# Patient Record
Sex: Female | Born: 1945 | Race: Black or African American | Hispanic: No | Marital: Married | Smoking: Never smoker
Health system: Southern US, Community
[De-identification: ages and names within clinical notes are randomized; demographics above are authoritative.]

## PROBLEM LIST (undated history)

## (undated) DIAGNOSIS — R351 Nocturia: Secondary | ICD-10-CM

## (undated) DIAGNOSIS — N3010 Interstitial cystitis (chronic) without hematuria: Secondary | ICD-10-CM

## (undated) DIAGNOSIS — H409 Unspecified glaucoma: Secondary | ICD-10-CM

## (undated) DIAGNOSIS — R300 Dysuria: Secondary | ICD-10-CM

## (undated) DIAGNOSIS — N3289 Other specified disorders of bladder: Secondary | ICD-10-CM

## (undated) DIAGNOSIS — R3915 Urgency of urination: Secondary | ICD-10-CM

## (undated) DIAGNOSIS — D863 Sarcoidosis of skin: Secondary | ICD-10-CM

## (undated) DIAGNOSIS — I509 Heart failure, unspecified: Secondary | ICD-10-CM

## (undated) DIAGNOSIS — I5040 Unspecified combined systolic (congestive) and diastolic (congestive) heart failure: Secondary | ICD-10-CM

## (undated) DIAGNOSIS — R350 Frequency of micturition: Secondary | ICD-10-CM

## (undated) DIAGNOSIS — I471 Supraventricular tachycardia: Secondary | ICD-10-CM

## (undated) DIAGNOSIS — I4719 Other supraventricular tachycardia: Secondary | ICD-10-CM

## (undated) DIAGNOSIS — R3989 Other symptoms and signs involving the genitourinary system: Secondary | ICD-10-CM

## (undated) DIAGNOSIS — H548 Legal blindness, as defined in USA: Secondary | ICD-10-CM

## (undated) HISTORY — DX: Unspecified combined systolic (congestive) and diastolic (congestive) heart failure: I50.40

## (undated) HISTORY — DX: Heart failure, unspecified: I50.9

## (undated) HISTORY — DX: Other supraventricular tachycardia: I47.19

## (undated) HISTORY — DX: Supraventricular tachycardia: I47.1

## (undated) HISTORY — PX: CATARACT EXTRACTION W/ INTRAOCULAR LENS IMPLANT: SHX1309

---

## 2013-04-13 ENCOUNTER — Other Ambulatory Visit: Payer: Self-pay | Admitting: Urology

## 2013-04-14 ENCOUNTER — Encounter (HOSPITAL_BASED_OUTPATIENT_CLINIC_OR_DEPARTMENT_OTHER): Payer: Self-pay | Admitting: *Deleted

## 2013-04-14 MED ORDER — TRIAMCINOLONE ACETONIDE 40 MG/ML IJ SUSP: Freq: Once | INTRAMUSCULAR | Status: DC

## 2013-04-14 MED ORDER — BUPIVACAINE HCL (PF) 0.5 % IJ SOLN: Freq: Once | INTRAMUSCULAR | Status: DC

## 2013-04-18 ENCOUNTER — Encounter (HOSPITAL_BASED_OUTPATIENT_CLINIC_OR_DEPARTMENT_OTHER): Payer: Self-pay | Admitting: *Deleted

## 2013-04-18 NOTE — Progress Notes (Signed)
NPO AFTER MN. ARRIVES AT 0930. NEEDS HG. 

## 2013-04-21 ENCOUNTER — Encounter (HOSPITAL_BASED_OUTPATIENT_CLINIC_OR_DEPARTMENT_OTHER): Payer: Self-pay | Admitting: Anesthesiology

## 2013-04-21 ENCOUNTER — Encounter (HOSPITAL_BASED_OUTPATIENT_CLINIC_OR_DEPARTMENT_OTHER)
Admission: RE | Disposition: A | Discharge status: Home / Self Care | Payer: Self-pay | Source: Ambulatory Visit | Attending: Urology

## 2013-04-21 ENCOUNTER — Ambulatory Visit (HOSPITAL_BASED_OUTPATIENT_CLINIC_OR_DEPARTMENT_OTHER): Payer: PRIVATE HEALTH INSURANCE | Admitting: Anesthesiology

## 2013-04-21 ENCOUNTER — Ambulatory Visit (HOSPITAL_BASED_OUTPATIENT_CLINIC_OR_DEPARTMENT_OTHER)
Admission: RE | Admit: 2013-04-21 | Discharge: 2013-04-21 | Disposition: A | Discharge status: Home / Self Care | Payer: PRIVATE HEALTH INSURANCE | Source: Ambulatory Visit | Attending: Urology | Admitting: Urology

## 2013-04-21 DIAGNOSIS — H409 Unspecified glaucoma: Secondary | ICD-10-CM | POA: Insufficient documentation

## 2013-04-21 DIAGNOSIS — N94819 Vulvodynia, unspecified: Secondary | ICD-10-CM | POA: Insufficient documentation

## 2013-04-21 DIAGNOSIS — F411 Generalized anxiety disorder: Secondary | ICD-10-CM | POA: Insufficient documentation

## 2013-04-21 DIAGNOSIS — N3010 Interstitial cystitis (chronic) without hematuria: Secondary | ICD-10-CM | POA: Insufficient documentation

## 2013-04-21 DIAGNOSIS — N3289 Other specified disorders of bladder: Secondary | ICD-10-CM | POA: Insufficient documentation

## 2013-04-21 DIAGNOSIS — N35919 Unspecified urethral stricture, male, unspecified site: Secondary | ICD-10-CM | POA: Insufficient documentation

## 2013-04-21 DIAGNOSIS — D869 Sarcoidosis, unspecified: Secondary | ICD-10-CM | POA: Insufficient documentation

## 2013-04-21 HISTORY — DX: Interstitial cystitis (chronic) without hematuria: N30.10

## 2013-04-21 HISTORY — DX: Other specified disorders of bladder: N32.89

## 2013-04-21 HISTORY — DX: Unspecified glaucoma: H40.9

## 2013-04-21 HISTORY — DX: Dysuria: R30.0

## 2013-04-21 HISTORY — PX: CYSTOSCOPY WITH HYDRODISTENSION AND BIOPSY: SHX5127

## 2013-04-21 HISTORY — DX: Urgency of urination: R39.15

## 2013-04-21 HISTORY — DX: Other symptoms and signs involving the genitourinary system: R39.89

## 2013-04-21 HISTORY — DX: Nocturia: R35.1

## 2013-04-21 HISTORY — DX: Frequency of micturition: R35.0

## 2013-04-21 HISTORY — DX: Legal blindness, as defined in USA: H54.8

## 2013-04-21 HISTORY — DX: Sarcoidosis of skin: D86.3

## 2013-04-21 SURGERY — CYSTOSCOPY, WITH BLADDER HYDRODISTENSION AND BIOPSY
Anesthesia: General | Site: Bladder | Wound class: Clean Contaminated

## 2013-04-21 MED ORDER — FENTANYL CITRATE 0.05 MG/ML IJ SOLN
INTRAMUSCULAR | Status: DC | PRN
  Administered 2013-04-21 (×6): 50 ug via INTRAVENOUS

## 2013-04-21 MED ORDER — TRIAMCINOLONE ACETONIDE 40 MG/ML IJ SUSP
INTRAMUSCULAR | Status: DC | PRN
  Administered 2013-04-21: 40 mg

## 2013-04-21 MED ORDER — OXYCODONE-ACETAMINOPHEN 5-325 MG PO TABS: 1.0000 | ORAL_TABLET | Freq: Four times a day (QID) | ORAL | Status: DC | PRN

## 2013-04-21 MED ORDER — PENTOSAN POLYSULFATE SODIUM 100 MG PO CAPS
100.0000 mg | ORAL_CAPSULE | Freq: Three times a day (TID) | ORAL | Status: DC
Start: 2013-04-21 — End: 2019-03-18

## 2013-04-21 MED ORDER — KETOROLAC TROMETHAMINE 30 MG/ML IJ SOLN
15.0000 mg | Freq: Once | INTRAMUSCULAR | Status: DC | PRN
  Filled 2013-04-21: qty 1

## 2013-04-21 MED ORDER — LIDOCAINE HCL (CARDIAC) 20 MG/ML IV SOLN
INTRAVENOUS | Status: DC | PRN
  Administered 2013-04-21: 60 mg via INTRAVENOUS

## 2013-04-21 MED ORDER — ACETAMINOPHEN 10 MG/ML IV SOLN
INTRAVENOUS | Status: DC | PRN
  Administered 2013-04-21: 1000 mg via INTRAVENOUS

## 2013-04-21 MED ORDER — CEFAZOLIN SODIUM-DEXTROSE 2-3 GM-% IV SOLR
2.0000 g | INTRAVENOUS | Status: AC
  Administered 2013-04-21: 2 g via INTRAVENOUS
  Filled 2013-04-21: qty 50

## 2013-04-21 MED ORDER — ONDANSETRON HCL 4 MG/2ML IJ SOLN
INTRAMUSCULAR | Status: DC | PRN
  Administered 2013-04-21: 4 mg via INTRAVENOUS

## 2013-04-21 MED ORDER — DEXAMETHASONE SODIUM PHOSPHATE 4 MG/ML IJ SOLN
INTRAMUSCULAR | Status: DC | PRN
  Administered 2013-04-21: 10 mg via INTRAVENOUS

## 2013-04-21 MED ORDER — HYDROXYZINE HCL 10 MG PO TABS: 10.0000 mg | ORAL_TABLET | Freq: Three times a day (TID) | ORAL | Status: DC | PRN

## 2013-04-21 MED ORDER — BELLADONNA ALKALOIDS-OPIUM 16.2-60 MG RE SUPP
RECTAL | Status: DC | PRN
  Administered 2013-04-21: 1 via RECTAL

## 2013-04-21 MED ORDER — BUPIVACAINE HCL 0.5 % IJ SOLN
INTRAMUSCULAR | Status: DC | PRN
  Administered 2013-04-21: 10 mL

## 2013-04-21 MED ORDER — URIBEL 118 MG PO CAPS: 1.0000 | ORAL_CAPSULE | Freq: Two times a day (BID) | ORAL | Status: DC | PRN

## 2013-04-21 MED ORDER — PHENAZOPYRIDINE HCL 200 MG PO TABS
ORAL | Status: DC | PRN
  Administered 2013-04-21: 12:00:00 via INTRAVESICAL

## 2013-04-21 MED ORDER — PROPOFOL 10 MG/ML IV BOLUS
INTRAVENOUS | Status: DC | PRN
  Administered 2013-04-21: 200 mg via INTRAVENOUS

## 2013-04-21 MED ORDER — MIDAZOLAM HCL 5 MG/5ML IJ SOLN
INTRAMUSCULAR | Status: DC | PRN
  Administered 2013-04-21 (×2): 1 mg via INTRAVENOUS

## 2013-04-21 MED ORDER — LACTATED RINGERS IV SOLN
INTRAVENOUS | Status: DC
  Administered 2013-04-21 (×3): via INTRAVENOUS
  Filled 2013-04-21: qty 1000

## 2013-04-21 MED ORDER — PROMETHAZINE HCL 25 MG/ML IJ SOLN
6.2500 mg | INTRAMUSCULAR | Status: DC | PRN
  Filled 2013-04-21: qty 1

## 2013-04-21 MED ORDER — KETOROLAC TROMETHAMINE 30 MG/ML IJ SOLN
INTRAMUSCULAR | Status: DC | PRN
  Administered 2013-04-21: 30 mg via INTRAVENOUS

## 2013-04-21 MED ORDER — CALCIUM GLYCEROPHOSPHATE 340 (65-50) MG (CA-P) PO TABS: 1.0000 | ORAL_TABLET | Freq: Three times a day (TID) | ORAL | Status: DC

## 2013-04-21 MED ORDER — FENTANYL CITRATE 0.05 MG/ML IJ SOLN
25.0000 ug | INTRAMUSCULAR | Status: DC | PRN
  Filled 2013-04-21: qty 1

## 2013-04-21 SURGICAL SUPPLY — 25 items
ADAPTER CATH WHT DISP STRL (CATHETERS) IMPLANT
BAG DRAIN URO-CYSTO SKYTR STRL (DRAIN) ×2 IMPLANT
BOOTIES KNEE HIGH SLOAN (MISCELLANEOUS) IMPLANT
CANISTER SUCT LVC 12 LTR MEDI- (MISCELLANEOUS) ×2 IMPLANT
CATH ROBINSON RED A/P 16FR (CATHETERS) IMPLANT
CLOTH BEACON ORANGE TIMEOUT ST (SAFETY) ×2 IMPLANT
DRAPE CAMERA CLOSED 9X96 (DRAPES) ×2 IMPLANT
ELECT REM PT RETURN 9FT ADLT (ELECTROSURGICAL) ×2
ELECTRODE REM PT RTRN 9FT ADLT (ELECTROSURGICAL) ×1 IMPLANT
GLOVE BIO SURGEON STRL SZ 6.5 (GLOVE) ×2 IMPLANT
GLOVE BIO SURGEON STRL SZ7.5 (GLOVE) ×2 IMPLANT
GLOVE BIOGEL M 8.0 STRL (GLOVE) ×2 IMPLANT
GLOVE ECLIPSE 8.0 STRL XLNG CF (GLOVE) IMPLANT
GLOVE INDICATOR 6.5 STRL GRN (GLOVE) ×2 IMPLANT
GOWN PREVENTION PLUS LG XLONG (DISPOSABLE) ×2 IMPLANT
GOWN STRL REIN XL XLG (GOWN DISPOSABLE) ×2 IMPLANT
NDL SAFETY ECLIPSE 18X1.5 (NEEDLE) ×1 IMPLANT
NEEDLE HYPO 18GX1.5 SHARP (NEEDLE) ×1
NEEDLE HYPO 22GX1.5 SAFETY (NEEDLE) ×2 IMPLANT
NEEDLE SPNL 22GX7 QUINCKE BK (NEEDLE) ×2 IMPLANT
NS IRRIG 500ML POUR BTL (IV SOLUTION) IMPLANT
PACK CYSTOSCOPY (CUSTOM PROCEDURE TRAY) ×2 IMPLANT
SYR 20CC LL (SYRINGE) ×4 IMPLANT
SYR BULB IRRIGATION 50ML (SYRINGE) IMPLANT
WATER STERILE IRR 3000ML UROMA (IV SOLUTION) ×2 IMPLANT

## 2013-04-21 NOTE — Progress Notes (Signed)
Assisted up to br x3 no void either time.  Pt has no urge to void.  Bladder scan shows 132cc'c urine in bladder .  Pt denies discomfort.  + flatus. abd soft nondistended.  Dr. Patsi Sears paged via beeper. Pt had 2.5 L IV and 1 L po

## 2013-04-21 NOTE — Progress Notes (Signed)
Pt  Chose to have foley inserted overnight with removal in am.  Pt and family expressed concern over pt getting home and still being unable to void.  Pt and family instructed in foley care and removal.  They are to call the Dr. If she is unable to void after cath removal.  Pt and family verbalized their understanding.

## 2013-04-21 NOTE — H&P (Signed)
Active Problems Problems  1. Abdominal Pain 789.00 2. Chronic Cystitis 595.2 3. Chronic Interstitial Cystitis 595.1 4. Nocturia 788.43 5. Urinary Frequency 788.41 6. 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 Sherron Monday for continued bladder aned vulvar pain.  She was seen by Dr. Sherron Monday on 03/22/13 as a [place] referred by Elvia Collum, PA regarding [name]'s suprapubic pain and frequency.  She was previously seen in 2009 and had failed Vesicare and Reunion and had urodynamics. She had pelvic pain and frequency.  It was thought that she had IC at that time. She never had cysto, HOD,however.  She currently c/o suprapubic pressure. She  voids q 10-30 minutes, because of pressure and pain,  and it is worse when she holds er urine and is relieved temporarily when she voids. Her flow is poor. Sometimes she sprays. She does not feel empty. She can double void a small amount. Nocturia 2-3 times an hour at night.   She denies a history of kidney stones, previous GU surgery, and urinary tract infections.  She has no neurologic risk factors or symptoms. She is prone to constipation. She was recently treated as a urinary tract infection.   There is no other modifying factors or associated signs or symptoms. There is no other aggravating or relieving factors.   Past Medical History Problems  1. History of  Glaucoma 365.9 2. History of  Sarcoidosis 135  Surgical History Problems  1. History of  No Surgical Problems  Current Meds 1. Aleve TABS; Therapy: (Recorded:23Jul2014) to 2. Calcium TABS; Therapy: (Recorded:13Apr2009) to 3. Centrum Silver TABS; Therapy: (Recorded:23Jul2014) to 4. Fish Oil CAPS; Therapy: (Recorded:13Apr2009) to 5. Hydrocortisone OINT; Therapy: (Recorded:23Jul2014) to 6. Meloxicam 15 MG Oral Tablet; Take 1 tablet daily; Therapy: 23Jul2014 to (Evaluate:20Nov2014)   Requested for:  23Jul2014; Last Rx:23Jul2014 7. Multi-Vitamin TABS; Therapy: (Recorded:13Apr2009) to  Allergies Medication  1. Cinnamon TABS  Family History Problems  1. Paternal history of  Acute Myocardial Infarction V17.3 2. Son's history of  Crohn's Disease 3. Family history of  Family Health Status Number Of Children 1 son and 1 daughter 4. Family history of  Family Health Status Of Father - Deceased 38yrs, MI 5. Family history of  Family Health Status Of Mother - Deceased 28yrs, complications of child birth 63. Paternal history of  Heart Disease V17.49 7. Son's history of  Sarcoidosis  Social History Problems  1. Alcohol Use wine rarely every other month 2. Caffeine Use 1-2 qd 3. Marital History - Divorced V61.03 4. Never A Smoker 5. Occupation: environmental services  Review of Systems Genitourinary, constitutional, skin, eye, otolaryngeal, hematologic/lymphatic, cardiovascular, pulmonary, endocrine, musculoskeletal, gastrointestinal, neurological and psychiatric system(s) were reviewed and pertinent findings if present are noted.  Genitourinary: urinary frequency, urinary urgency, nocturia, urinary hesitancy, urinary stream starts and stops, pelvic pain, suprapubic pain and initiating urination requires straining.  Gastrointestinal: constipation.  Constitutional: feeling tired (fatigue).  Integumentary: skin rash/lesion.  ENT: sore throat and sinus problems.  Respiratory: cough.  Musculoskeletal: joint pain.    Vitals Vital Signs [Data Includes: Last 1 Day]  08Aug2014 09:12AM  Blood Pressure: 163 / 77 Temperature: 98.2 F Heart Rate: 94  Physical Exam Constitutional: Well nourished and well developed . No acute distress.  Neck: The appearance of the neck is normal.  Pulmonary: No respiratory distress.  Abdomen: The abdomen is soft and nontender. No masses are palpated. No CVA tenderness.  No hernias are palpable. No hepatosplenomegaly noted. Genitourinary:. Levator spasm and  pain.  Chaperone Present: laura.  Examination of the external genitalia shows no vulvar atrophy. The urethra is normal in appearance, not tender and no urethral caruncle. There is no urethral mass. Urethral hypermobility is not present. There is no urethral discharge. No periurethral cyst is identified. There is no urethral prolapse. Vaginal exam demonstrates tenderness and the vaginal epithelium to be poorly estrogenized, but no abnormalities, no discharge and no uterine prolapse. No cystocele is identified. No enterocele is identified. No rectocele is identified. There is no evidence of a vesicovaginal fistula. The cervix is is without abnormalities and without masses. There is no cervical discharge. There is no cervical motion tenderness The uterus is without abnormalities. The adnexa are palpably normal. The bladder is normal on palpation. The anus is normal on inspection. The perineum is normal on inspection.  Lymphatics: The femoral and inguinal nodes are not enlarged or tender.  Skin: Normal skin turgor, no visible rash and no visible skin lesions.  Neuro/Psych:. Mood and affect are appropriate.    Results/Data Urine [Data Includes: Last 1 Day]   08Aug2014  COLOR STRAW   APPEARANCE CLEAR   SPECIFIC GRAVITY 1.010   pH 7.0   GLUCOSE NEG mg/dL  BILIRUBIN NEG   KETONE NEG mg/dL  BLOOD NEG   PROTEIN NEG mg/dL  UROBILINOGEN 0.2 mg/dL  NITRITE NEG   LEUKOCYTE ESTERASE SMALL   SQUAMOUS EPITHELIAL/HPF MODERATE   WBC 7-10 WBC/hpf  RBC 0-2 RBC/hpf  BACTERIA NONE SEEN   CRYSTALS NONE SEEN   CASTS NONE SEEN    Assessment Assessed  1. 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 Abdominal Pain 789.00         Vulvadynea and levator spasm. She has IC and has not responded to medications in the past. She has not had cysto, HOD, and I have disucssed this with he and her daughter ( 45 min). She has great anxiety, and we have disucssed alternatives for her. I have discussed  levator spasm, and my plan for valium suppositories ( vaginal), and will proceed with cysto, HOD.   Plan Health Maintenance (V70.0)  1. UA With REFLEX  Done: 08Aug2014 09:05AM   Valium vaginal suppositories.  Cysto, HOD.   Signatures Electronically signed by : Jethro Bolus, M.D.; Apr 07 2013  4:39PM

## 2013-04-21 NOTE — Transfer of Care (Signed)
Immediate Anesthesia Transfer of Care Note  Patient: Robin Estes  Procedure(s) Performed: Procedure(s) (LRB): CYSTOSCOPY/BLADDER BIOPSY/HYDRODISTENSION (N/A)  Patient Location: PACU  Anesthesia Type: General  Level of Consciousness: awake, alert  and oriented  Airway & Oxygen Therapy: Patient Spontanous Breathing and Patient connected to face mask oxygen  Post-op Assessment: Report given to PACU RN and Post -op Vital signs reviewed and stable  Post vital signs: Reviewed and stable  Complications: No apparent anesthesia complications

## 2013-04-21 NOTE — Anesthesia Postprocedure Evaluation (Signed)
  Anesthesia Post-op Note  Patient: Robin Estes  Procedure(s) Performed: Procedure(s) (LRB): CYSTOSCOPY/BLADDER BIOPSY/HYDRODISTENSION (N/A)  Patient Location: PACU  Anesthesia Type: General  Level of Consciousness: awake and alert   Airway and Oxygen Therapy: Patient Spontanous Breathing  Post-op Pain: mild  Post-op Assessment: Post-op Vital signs reviewed, Patient's Cardiovascular Status Stable, Respiratory Function Stable, Patent Airway and No signs of Nausea or vomiting  Last Vitals:  Filed Vitals:   04/21/13 1205  BP: 120/68  Pulse: 75  Temp: 36.1 C  Resp: 19    Post-op Vital Signs: stable   Complications: No apparent anesthesia complications

## 2013-04-21 NOTE — Interval H&P Note (Signed)
History and Physical Interval Note:  04/21/2013 10:25 AM  Robin Estes  has presented today for surgery, with the diagnosis of Chronic Interstitial Cystitis  The various methods of treatment have been discussed with the patient and family. After consideration of risks, benefits and other options for treatment, the patient has consented to  Procedure(s) with comments: CYSTOSCOPY/BLADDER BIOPSY/HYDRODISTENSION (N/A) - BLADDER BIOPSY   as a surgical intervention .  The patient's history has been reviewed, patient examined, no change in status, stable for surgery.  I have reviewed the patient's chart and labs.  Questions were answered to the patient's satisfaction.     Jethro Bolus I

## 2013-04-21 NOTE — Anesthesia Preprocedure Evaluation (Addendum)
Anesthesia Evaluation  Patient identified by MRN, date of birth, ID band Patient awake    Reviewed: Allergy & Precautions, H&P , NPO status , Patient's Chart, lab work & pertinent test results  Airway Mallampati: II TM Distance: >3 FB Neck ROM: Full    Dental no notable dental hx.    Pulmonary neg pulmonary ROS,  breath sounds clear to auscultation  Pulmonary exam normal       Cardiovascular negative cardio ROS  Rhythm:Regular Rate:Normal     Neuro/Psych Legally blind in right eye, as defined in [place]   SECONDARY TO GLAUCOMA  negative neurological ROS  negative psych ROS   GI/Hepatic negative GI ROS, Neg liver ROS,   Endo/Other  negative endocrine ROS  Renal/GU negative Renal ROS  negative genitourinary   Musculoskeletal negative musculoskeletal ROS (+)   Abdominal   Peds negative pediatric ROS (+)  Hematology negative hematology ROS (+)   Anesthesia Other Findings   Reproductive/Obstetrics negative OB ROS                           Anesthesia Physical Anesthesia Plan  ASA: I  Anesthesia Plan: General   Post-op Pain Management:    Induction: Intravenous  Airway Management Planned: LMA  Additional Equipment:   Intra-op Plan:   Post-operative Plan:   Informed Consent: I have reviewed the patients History and Physical, chart, labs and discussed the procedure including the risks, benefits and alternatives for the proposed anesthesia with the patient or authorized representative who has indicated his/her understanding and acceptance.   Dental advisory given  Plan Discussed with: CRNA and Surgeon  Anesthesia Plan Comments:         Anesthesia Quick Evaluation

## 2013-04-21 NOTE — Anesthesia Procedure Notes (Signed)
Procedure Name: LMA Insertion Date/Time: 04/21/2013 11:20 AM Performed by: Norva Pavlov Pre-anesthesia Checklist: Patient identified, Emergency Drugs available, Suction available and Patient being monitored Patient Re-evaluated:Patient Re-evaluated prior to inductionOxygen Delivery Method: Circle System Utilized Preoxygenation: Pre-oxygenation with 100% oxygen Intubation Type: IV induction Ventilation: Mask ventilation without difficulty LMA: LMA inserted LMA Size: 4.0 Number of attempts: 1 Airway Equipment and Method: bite block Placement Confirmation: positive ETCO2 Tube secured with: Tape Dental Injury: Teeth and Oropharynx as per pre-operative assessment

## 2013-04-21 NOTE — Progress Notes (Signed)
Dr. Patsi Sears informed that pt is unable to void.  Denies pain / discomfort.  . Had 1 liter po and 2.5 liters IV including OR.  Orders received to wait 30 min to 1 hr .  If pt doesn't void.  I&O cath and send pt home w instructions to call for problems or go to the nearest ER . Pt tolerated procedure well. Leg bag applied after 175cc's urine collected.

## 2013-04-21 NOTE — Op Note (Signed)
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  Surgeon:  Kathie Rhodes. Patsi Sears, MD  First assistant:  None  Anesthesia:  General LMA  Preparation:  After appropriate preanesthesia, the patient was brought the operative room, placed on the operating table in dorsal supine position where general LMA anesthesia was introduced. She was then replaced in the dorsal lithotomy position with pubis was prepped with Betadine solution draped in usual fashion. History was reviewed.  Review history:   Problems  1. Abdominal Pain 789.00  2. Chronic Cystitis 595.2  3. 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 Nocturia 788.43  5. Urinary Frequency 788.41  6. 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 Sherron Monday for continued bladder aned vulvar pain. She was seen by Dr. Sherron Monday on 03/22/13 as a [place] referred by Elvia Collum, PA regarding Ms. Damewood's suprapubic pain and frequency.  She was previously seen in 2009 and had failed Vesicare and Reunion and had urodynamics. She had pelvic pain and frequency. It was thought that she had IC at that time. She never had cysto, HOD,however.  She currently c/o suprapubic pressure. She voids q 10-30 minutes, because of pressure and pain, and it is worse when she holds er urine and is relieved temporarily when she voids. Her flow is poor. Sometimes she sprays. She does not feel empty. She can double void a small amount. Nocturia 2-3 times an hour at night.  She denies a history of kidney stones, previous GU surgery, and urinary  tract infections.  She has no neurologic risk factors or symptoms. She is prone to constipation. She was recently treated as a urinary tract infection.  There is no other modifying factors or associated signs or symptoms. There is no other aggravating or relieving factors.  Past Medical History  Problems  1. History of Glaucoma 365.9  2. History of Sarcoidosis 135    Statement of  Likelihood of Success: Excellent. TIME-OUT observed.:  Procedure:  The urethra appeared to be quite stenotic, was dilated sequentially to a size 24 [place] with the female sounds. The 22 French cystoscope was placed in the bladder, and the bladder base appeared normal, with clear reflux from both cortices. There was no evidence of bladder stone, tumor, or diverticular formation. The bladder held only 300 cc, and repeat cystoscopy showed marked relations, and easy friable bleeding. Cold cup bladder biopsies were accomplished and the sites were cauterized. The bladder was not able to be hydrodistended, with attempts at hydrodistention yielding only 300 cc bladder.  Following this, Pyridium Marcaine mixture was placed in the bladder. The patient then underwent injection of Marcaine Kenalog solution in the bladder base, and in the spastic levator muscles. She received a B. and O. suppository, IV Tylenol, IV Toradol, as well as IV antibiotic. She was awakened, and taken to recovery room in good condition.

## 2013-04-24 ENCOUNTER — Encounter (HOSPITAL_BASED_OUTPATIENT_CLINIC_OR_DEPARTMENT_OTHER): Payer: Self-pay | Admitting: Urology

## 2013-04-24 LAB — POCT HEMOGLOBIN-HEMACUE: Hemoglobin: 12.1 g/dL (ref 12.0–15.0)

## 2019-03-15 ENCOUNTER — Encounter (HOSPITAL_BASED_OUTPATIENT_CLINIC_OR_DEPARTMENT_OTHER): Payer: Self-pay | Admitting: *Deleted

## 2019-03-15 ENCOUNTER — Emergency Department (HOSPITAL_BASED_OUTPATIENT_CLINIC_OR_DEPARTMENT_OTHER): Payer: Medicare Other

## 2019-03-15 ENCOUNTER — Inpatient Hospital Stay (HOSPITAL_BASED_OUTPATIENT_CLINIC_OR_DEPARTMENT_OTHER)
Admission: EM | Admit: 2019-03-15 | Discharge: 2019-03-18 | DRG: 292 | Disposition: A | Discharge status: Home / Self Care | Payer: Medicare Other | Attending: Internal Medicine | Admitting: Internal Medicine

## 2019-03-15 ENCOUNTER — Other Ambulatory Visit: Payer: Self-pay

## 2019-03-15 DIAGNOSIS — I248 Other forms of acute ischemic heart disease: Secondary | ICD-10-CM | POA: Diagnosis present

## 2019-03-15 DIAGNOSIS — E669 Obesity, unspecified: Secondary | ICD-10-CM | POA: Diagnosis not present

## 2019-03-15 DIAGNOSIS — I110 Hypertensive heart disease with heart failure: Principal | ICD-10-CM | POA: Diagnosis present

## 2019-03-15 DIAGNOSIS — I5041 Acute combined systolic (congestive) and diastolic (congestive) heart failure: Secondary | ICD-10-CM | POA: Diagnosis present

## 2019-03-15 DIAGNOSIS — D8689 Sarcoidosis of other sites: Secondary | ICD-10-CM | POA: Diagnosis not present

## 2019-03-15 DIAGNOSIS — R778 Other specified abnormalities of plasma proteins: Secondary | ICD-10-CM

## 2019-03-15 DIAGNOSIS — Z1159 Encounter for screening for other viral diseases: Secondary | ICD-10-CM | POA: Diagnosis not present

## 2019-03-15 DIAGNOSIS — R739 Hyperglycemia, unspecified: Secondary | ICD-10-CM | POA: Diagnosis not present

## 2019-03-15 DIAGNOSIS — Z79899 Other long term (current) drug therapy: Secondary | ICD-10-CM | POA: Diagnosis not present

## 2019-03-15 DIAGNOSIS — E785 Hyperlipidemia, unspecified: Secondary | ICD-10-CM | POA: Diagnosis present

## 2019-03-15 DIAGNOSIS — R9431 Abnormal electrocardiogram [ECG] [EKG]: Secondary | ICD-10-CM | POA: Diagnosis present

## 2019-03-15 DIAGNOSIS — I340 Nonrheumatic mitral (valve) insufficiency: Secondary | ICD-10-CM | POA: Diagnosis not present

## 2019-03-15 DIAGNOSIS — Z6832 Body mass index (BMI) 32.0-32.9, adult: Secondary | ICD-10-CM

## 2019-03-15 DIAGNOSIS — T501X5A Adverse effect of loop [high-ceiling] diuretics, initial encounter: Secondary | ICD-10-CM | POA: Diagnosis not present

## 2019-03-15 DIAGNOSIS — D869 Sarcoidosis, unspecified: Secondary | ICD-10-CM | POA: Diagnosis present

## 2019-03-15 DIAGNOSIS — I5040 Unspecified combined systolic (congestive) and diastolic (congestive) heart failure: Secondary | ICD-10-CM

## 2019-03-15 DIAGNOSIS — R590 Localized enlarged lymph nodes: Secondary | ICD-10-CM

## 2019-03-15 DIAGNOSIS — Z8249 Family history of ischemic heart disease and other diseases of the circulatory system: Secondary | ICD-10-CM | POA: Diagnosis not present

## 2019-03-15 DIAGNOSIS — D649 Anemia, unspecified: Secondary | ICD-10-CM | POA: Diagnosis not present

## 2019-03-15 DIAGNOSIS — H548 Legal blindness, as defined in USA: Secondary | ICD-10-CM | POA: Diagnosis not present

## 2019-03-15 DIAGNOSIS — M8580 Other specified disorders of bone density and structure, unspecified site: Secondary | ICD-10-CM | POA: Diagnosis present

## 2019-03-15 DIAGNOSIS — I517 Cardiomegaly: Secondary | ICD-10-CM | POA: Diagnosis present

## 2019-03-15 DIAGNOSIS — N3010 Interstitial cystitis (chronic) without hematuria: Secondary | ICD-10-CM | POA: Diagnosis not present

## 2019-03-15 DIAGNOSIS — I471 Supraventricular tachycardia: Secondary | ICD-10-CM | POA: Diagnosis present

## 2019-03-15 DIAGNOSIS — R000 Tachycardia, unspecified: Secondary | ICD-10-CM | POA: Diagnosis present

## 2019-03-15 DIAGNOSIS — J90 Pleural effusion, not elsewhere classified: Secondary | ICD-10-CM

## 2019-03-15 DIAGNOSIS — I4891 Unspecified atrial fibrillation: Secondary | ICD-10-CM | POA: Diagnosis present

## 2019-03-15 DIAGNOSIS — I4719 Other supraventricular tachycardia: Secondary | ICD-10-CM

## 2019-03-15 DIAGNOSIS — R0609 Other forms of dyspnea: Secondary | ICD-10-CM

## 2019-03-15 DIAGNOSIS — I5020 Unspecified systolic (congestive) heart failure: Secondary | ICD-10-CM

## 2019-03-15 DIAGNOSIS — R0602 Shortness of breath: Secondary | ICD-10-CM | POA: Diagnosis present

## 2019-03-15 DIAGNOSIS — R7989 Other specified abnormal findings of blood chemistry: Secondary | ICD-10-CM | POA: Diagnosis present

## 2019-03-15 HISTORY — DX: Abnormal electrocardiogram (ECG) (EKG): R94.31

## 2019-03-15 HISTORY — DX: Cardiomegaly: I51.7

## 2019-03-15 HISTORY — DX: Pleural effusion, not elsewhere classified: J90

## 2019-03-15 HISTORY — DX: Sarcoidosis, unspecified: D86.9

## 2019-03-15 HISTORY — DX: Other specified abnormalities of plasma proteins: R77.8

## 2019-03-15 HISTORY — DX: Localized enlarged lymph nodes: R59.0

## 2019-03-15 LAB — CBC WITH DIFFERENTIAL/PLATELET
Abs Immature Granulocytes: 0.02 10*3/uL (ref 0.00–0.07)
Basophils Absolute: 0 10*3/uL (ref 0.0–0.1)
Basophils Relative: 0 %
Eosinophils Absolute: 0.1 10*3/uL (ref 0.0–0.5)
Eosinophils Relative: 1 %
HCT: 37.2 % (ref 36.0–46.0)
Hemoglobin: 11.3 g/dL — ABNORMAL LOW (ref 12.0–15.0)
Immature Granulocytes: 0 %
Lymphocytes Relative: 9 %
Lymphs Abs: 0.4 10*3/uL — ABNORMAL LOW (ref 0.7–4.0)
MCH: 28.2 pg (ref 26.0–34.0)
MCHC: 30.4 g/dL (ref 30.0–36.0)
MCV: 92.8 fL (ref 80.0–100.0)
Monocytes Absolute: 0.4 10*3/uL (ref 0.1–1.0)
Monocytes Relative: 8 %
Neutro Abs: 3.8 10*3/uL (ref 1.7–7.7)
Neutrophils Relative %: 82 %
Platelets: 276 10*3/uL (ref 150–400)
RBC: 4.01 MIL/uL (ref 3.87–5.11)
RDW: 14.1 % (ref 11.5–15.5)
WBC: 4.7 10*3/uL (ref 4.0–10.5)
nRBC: 0 % (ref 0.0–0.2)

## 2019-03-15 LAB — D-DIMER, QUANTITATIVE (NOT AT ARMC): D-Dimer, Quant: 1.56 ug/mL-FEU — ABNORMAL HIGH (ref 0.00–0.50)

## 2019-03-15 LAB — BASIC METABOLIC PANEL
Anion gap: 12 (ref 5–15)
BUN: 10 mg/dL (ref 8–23)
CO2: 25 mmol/L (ref 22–32)
Calcium: 8.8 mg/dL — ABNORMAL LOW (ref 8.9–10.3)
Chloride: 106 mmol/L (ref 98–111)
Creatinine, Ser: 0.73 mg/dL (ref 0.44–1.00)
GFR calc Af Amer: >60 mL/min (ref >60)
GFR calc non Af Amer: >60 mL/min (ref >60)
Glucose, Bld: 110 mg/dL — ABNORMAL HIGH (ref 70–99)
Potassium: 3.6 mmol/L (ref 3.5–5.1)
Sodium: 143 mmol/L (ref 135–145)

## 2019-03-15 LAB — TROPONIN I (HIGH SENSITIVITY)
Troponin I (High Sensitivity): 22 ng/L — ABNORMAL HIGH (ref <18)
Troponin I (High Sensitivity): 20 ng/L — ABNORMAL HIGH (ref <18)
Troponin I (High Sensitivity): 23 ng/L — ABNORMAL HIGH (ref <18)
Troponin I (High Sensitivity): 23 ng/L — ABNORMAL HIGH (ref <18)

## 2019-03-15 LAB — TSH: TSH: 1.344 u[IU]/mL (ref 0.350–4.500)

## 2019-03-15 LAB — MRSA PCR SCREENING: MRSA by PCR: NEGATIVE

## 2019-03-15 LAB — BRAIN NATRIURETIC PEPTIDE: B Natriuretic Peptide: 446.6 pg/mL — ABNORMAL HIGH (ref 0.0–100.0)

## 2019-03-15 LAB — SARS CORONAVIRUS 2 BY RT PCR (HOSPITAL ORDER, PERFORMED IN ~~LOC~~ HOSPITAL LAB): SARS Coronavirus 2: NEGATIVE

## 2019-03-15 IMAGING — CT CT ANGIOGRAPHY CHEST
1 of 8 series · 3 of 16 positions shown · IV contrast (omnipaque)
Comparison: Same day chest radiograph

CLINICAL DATA: Shortness of breath, cough, lower extremity
swelling, rule out PE

EXAM:
CT ANGIOGRAPHY CHEST WITH CONTRAST
TECHNIQUE: Multidetector CT imaging of the chest was performed using the
standard protocol during bolus administration of intravenous
contrast. Multiplanar CT image reconstructions and MIPs were
obtained to evaluate the vascular anatomy.
CONTRAST:  100mL OMNIPAQUE IOHEXOL 350 MG/ML SOLN

[Series 10: pe thins · axial · 0.63mm/px · z∈[-217,-85]mm · 3 of 265 slices shown]
[im 67/265  lung]
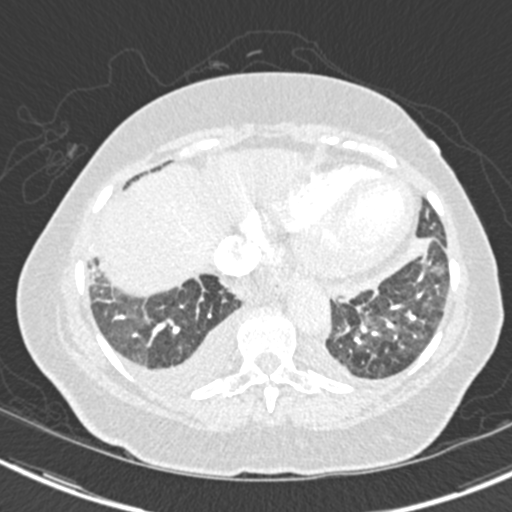
[im 133/265  soft-tissue]
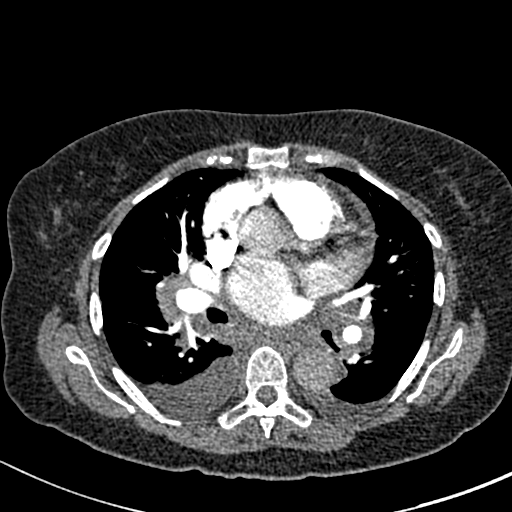
[im 199/265  lung]
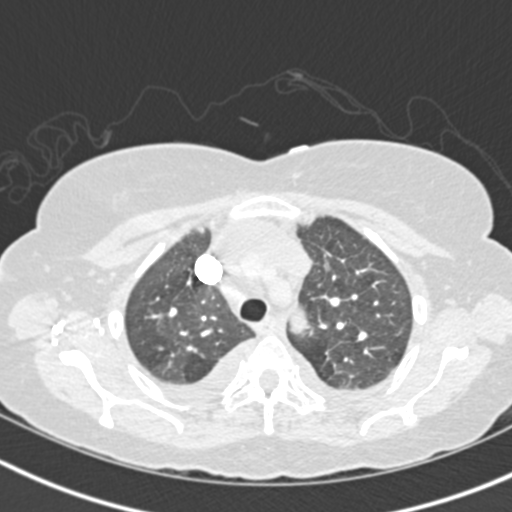

[3 of 16 positions shown; findings below may reference images not displayed]

FINDINGS: Cardiovascular: Satisfactory opacification of the pulmonary arteries
to the segmental level. No evidence of pulmonary embolism.
Cardiomegaly. Trace pericardial effusion.

Mediastinum/Nodes: Bulky bilateral hilar and mediastinal
lymphadenopathy, largest right hilar lymph nodes measuring at least
3.2 x 2.7 cm. Enlarged left lobe of the thyroid, poorly imaged due
to adjacent dense streak artifact from contrast bolus. Trachea, and
esophagus demonstrate no significant findings.

Lungs/Pleura: Multiple bilateral pulmonary nodules, largest in the
right upper lobe measuring 1.0 cm (series 5, image 33). There is
extensive geographic airspace attenuation suggesting small airways
disease. Small, right greater than left bilateral pleural effusions.

Upper Abdomen: No acute abnormality.

Musculoskeletal: No chest wall abnormality. No acute or significant
osseous findings.

Review of the MIP images confirms the above findings.
IMPRESSION: 1.  Negative examination for pulmonary embolism.

2. Bulky bilateral hilar and mediastinal lymphadenopathy, largest
right hilar lymph nodes measuring at least 3.2 x 2.7 cm. By report,
patient has known diagnosis of sarcoidosis.

3. Small, right greater than left pleural effusions with associated
atelectasis or consolidation.

4. Multiple bilateral pulmonary nodules, largest in the right upper
lobe measuring 1.0 cm (series 5, image 33). These are nonspecific.
Recommend follow-up CT at 3-6 months to evaluate for initial
stability if there is no prior imaging available to prove stability.

5. There is extensive geographic airspace attenuation suggesting air
trapping and small airways disease.

6.  Cardiomegaly and trace pericardial effusion.

## 2019-03-15 IMAGING — DX PORTABLE CHEST - 1 VIEW
1 series · 1 of 1 positions shown · non-contrast
Comparison: None.

CLINICAL DATA: One week history of cough and shortness of breath.

EXAM:
PORTABLE CHEST 1 VIEW

[chest ap]
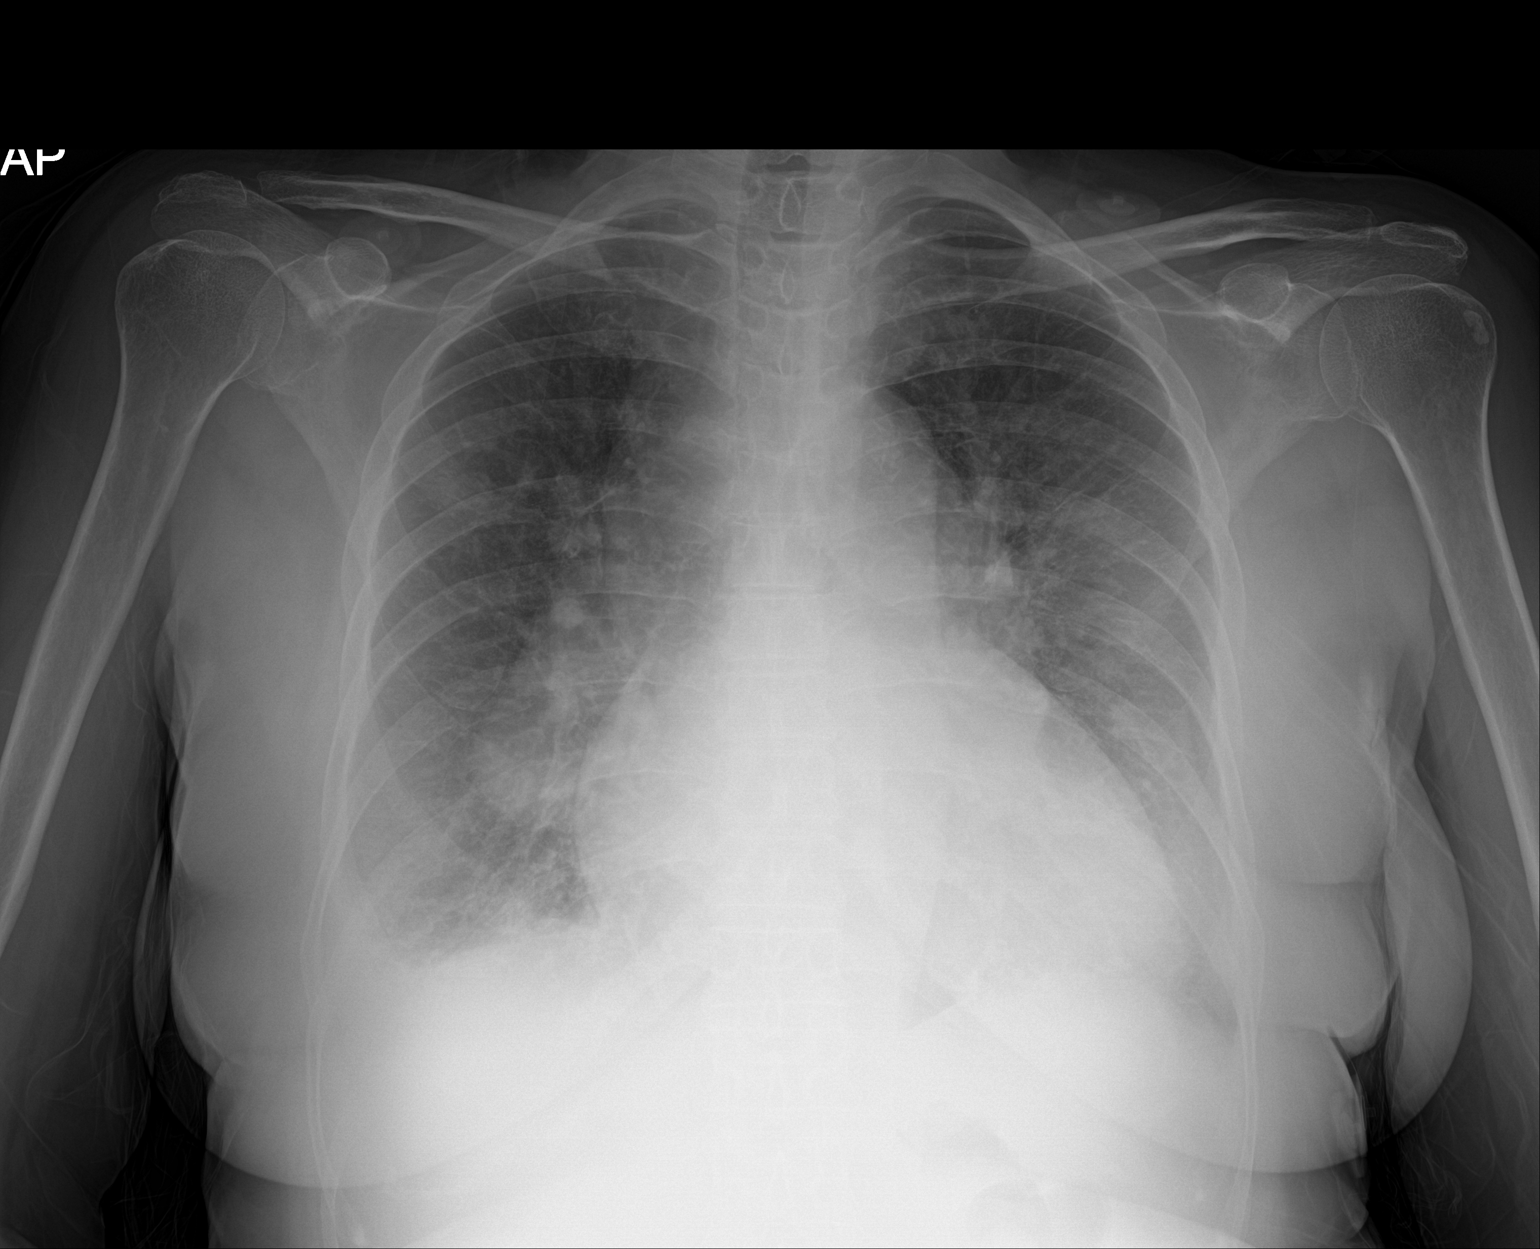

[1 of 1 positions shown; findings below may reference images not displayed]

FINDINGS: Borderline cardiac enlargement given the AP projection and portable
technique. Prominent mediastinal and hilar contours. Could not
exclude adenopathy.

Central vascular congestion and increased interstitial markings
could suggest CHF. Suspect small bilateral pleural effusions, right
greater than left with overlying atelectasis.
IMPRESSION: 1. Suspect mild CHF with interstitial edema and small effusions.
2. Findings worrisome for mediastinal and hilar adenopathy. I would
recommend a repeat two-view chest film once the patient's symptoms
of improved to reassess this. A chest CT with contrast may be
necessary.
3. No definite infiltrates.

## 2019-03-15 MED ORDER — ALBUTEROL SULFATE (2.5 MG/3ML) 0.083% IN NEBU: 5.0000 mg | INHALATION_SOLUTION | Freq: Once | RESPIRATORY_TRACT | Status: DC

## 2019-03-15 MED ORDER — DILTIAZEM HCL 100 MG IV SOLR
5.0000 mg/h | INTRAVENOUS | Status: DC
  Administered 2019-03-15: 17:00:00 5 mg/h via INTRAVENOUS

## 2019-03-15 MED ORDER — DILTIAZEM HCL 100 MG IV SOLR
INTRAVENOUS | Status: AC
  Filled 2019-03-15: qty 100

## 2019-03-15 MED ORDER — SODIUM CHLORIDE 0.9 % IV SOLN: 250.0000 mL | INTRAVENOUS | Status: DC | PRN

## 2019-03-15 MED ORDER — SODIUM CHLORIDE 0.9% FLUSH: 3.0000 mL | INTRAVENOUS | Status: DC | PRN

## 2019-03-15 MED ORDER — DILTIAZEM HCL 100 MG IV SOLR
5.0000 mg/h | INTRAVENOUS | Status: DC
  Administered 2019-03-15 – 2019-03-16 (×2): 5 mg/h via INTRAVENOUS
  Filled 2019-03-15: qty 100

## 2019-03-15 MED ORDER — IPRATROPIUM BROMIDE 0.02 % IN SOLN: 0.5000 mg | Freq: Once | RESPIRATORY_TRACT | Status: DC

## 2019-03-15 MED ORDER — FUROSEMIDE 10 MG/ML IJ SOLN
40.0000 mg | Freq: Once | INTRAMUSCULAR | Status: AC
  Administered 2019-03-15: 16:00:00 40 mg via INTRAVENOUS
  Filled 2019-03-15: qty 4

## 2019-03-15 MED ORDER — DILTIAZEM HCL ER COATED BEADS 120 MG PO CP24
120.0000 mg | ORAL_CAPSULE | Freq: Once | ORAL | Status: AC
  Administered 2019-03-15: 120 mg via ORAL
  Filled 2019-03-15: qty 1

## 2019-03-15 MED ORDER — SODIUM CHLORIDE 0.9% FLUSH
3.0000 mL | Freq: Two times a day (BID) | INTRAVENOUS | Status: DC
  Administered 2019-03-16 – 2019-03-18 (×4): 3 mL via INTRAVENOUS

## 2019-03-15 MED ORDER — DILTIAZEM HCL 30 MG PO TABS: 120.0000 mg | ORAL_TABLET | Freq: Once | ORAL | Status: DC

## 2019-03-15 MED ORDER — ACETAMINOPHEN 325 MG PO TABS: 650.0000 mg | ORAL_TABLET | ORAL | Status: DC | PRN

## 2019-03-15 MED ORDER — ALBUTEROL SULFATE (2.5 MG/3ML) 0.083% IN NEBU: 2.5000 mg | INHALATION_SOLUTION | RESPIRATORY_TRACT | Status: DC | PRN

## 2019-03-15 MED ORDER — DILTIAZEM HCL 25 MG/5ML IV SOLN
20.0000 mg | Freq: Once | INTRAVENOUS | Status: AC
  Administered 2019-03-15: 10 mg via INTRAVENOUS
  Filled 2019-03-15: qty 5

## 2019-03-15 MED ORDER — FUROSEMIDE 10 MG/ML IJ SOLN
40.0000 mg | Freq: Once | INTRAMUSCULAR | Status: AC
  Administered 2019-03-16: 06:00:00 40 mg via INTRAVENOUS
  Filled 2019-03-15: qty 4

## 2019-03-15 MED ORDER — DILTIAZEM HCL 25 MG/5ML IV SOLN
INTRAVENOUS | Status: AC
  Filled 2019-03-15: qty 5

## 2019-03-15 MED ORDER — SODIUM CHLORIDE 0.9% FLUSH
3.0000 mL | Freq: Two times a day (BID) | INTRAVENOUS | Status: DC
  Administered 2019-03-16 – 2019-03-17 (×2): 3 mL via INTRAVENOUS

## 2019-03-15 MED ORDER — IOHEXOL 350 MG/ML SOLN
100.0000 mL | Freq: Once | INTRAVENOUS | Status: AC | PRN
  Administered 2019-03-15: 100 mL via INTRAVENOUS

## 2019-03-15 MED ORDER — FUROSEMIDE 10 MG/ML IJ SOLN: 40.0000 mg | Freq: Every day | INTRAMUSCULAR | Status: DC

## 2019-03-15 MED ORDER — ENOXAPARIN SODIUM 40 MG/0.4ML ~~LOC~~ SOLN
40.0000 mg | SUBCUTANEOUS | Status: DC
  Administered 2019-03-15 – 2019-03-17 (×3): 40 mg via SUBCUTANEOUS
  Filled 2019-03-15 (×3): qty 0.4

## 2019-03-15 MED ORDER — GUAIFENESIN ER 600 MG PO TB12
600.0000 mg | ORAL_TABLET | Freq: Two times a day (BID) | ORAL | Status: DC
  Administered 2019-03-15 – 2019-03-18 (×5): 600 mg via ORAL
  Filled 2019-03-15 (×5): qty 1

## 2019-03-15 NOTE — Treatment Plan (Addendum)
Discussed with Dr. Tomi Bamberger. 73 yo with hx of sarcoidosis presenting with 1 week of progressive DOE and shortness of breath as well as lower extremity edema.  Pt became increasingly dyspneic when ambulating as well as tachycardic, but was not hypoxic.  Admission for further w/o and treatment requested due to her dyspnea.  W/u so far with elevated BNP and d dimer.  Elevated high sensitivity troponin, but flat.  CT PE protocol notable for bilateral hilar and mediastinal lymphadenopathy, right greater than L small pleural effusions with associated atelectasis or consolidation, multiple bilateral pulm nodules, as well as geographic airspace attenuation suggesting air trapping and small airways disease.  Will admit to obs tele at [facility].  Requested lasix with concern for edema/progressive DOE with possible HF exacerbation.  Requested nebs as well given air trapping on imaging, consider steroids based on exam.  Would obtain echo once admitted.  Consider pulm c/s if concern for pulm involvement of sarcoidosis.    Addendum: pt became more tachy waiting for transport.  Per Dr. Tomi Bamberger, appeared c/w afib vs flutter.  Giving IV dilt.  Requested something PO if she responds after this.  If no response, requested notification so gtt can potentially be started and pt switched to stepdown bed.  Addendum: pt being started on dilt gtt.  Will need stepdown bed.

## 2019-03-15 NOTE — ED Provider Notes (Signed)
[facility] EMERGENCY DEPARTMENT Provider Note   CSN: [id number] Arrival date & time: 03/15/19  [id number]     History   Chief Complaint Chief Complaint  Patient presents with  . Shortness of Breath    HPI Robin Estes is a 73 y.o. female.  She is brought in by her daughter for 1 week of progressive shortness of breath dyspnea on exertion.  There is also been some increased peripheral edema in her lower extremities symmetric.  Intermittent cough productive of some clear phlegm but more recently yellow-green.  No fevers no chest pain no loss of appetite no vomiting or diarrhea.  No sick contacts or recent travel.  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  She has not experienced any lung manifestations of her sarcoid.     The history is provided by the patient and a relative.  Shortness of Breath Severity:  Moderate Onset quality:  Gradual Duration:  1 week Timing:  Intermittent Progression:  Unchanged Chronicity:  New Context: activity   Relieved by:  Rest Worsened by:  Activity and coughing Ineffective treatments:  None tried Associated symptoms: cough, rash (sarcoid) and sputum production   Associated symptoms: no abdominal pain, no chest pain, no diaphoresis, no fever, no headaches, no hemoptysis, no neck pain, no sore throat, no syncope, no vomiting and no wheezing     Past Medical History:  Diagnosis Date  . Bladder spasms   . Chronic interstitial cystitis   . Dysuria   . End-stage glaucoma    RIGHT EYE  . Frequency of urination   . Legally blind in right eye, as defined in [place]    SECONDARY TO GLAUCOMA  . Nocturia   . Sarcoidosis of skin   . Sensation of pressure in bladder area   . Urgency of urination     There are no active problems to display for this patient.   Past Surgical History:  Procedure Laterality Date  . CATARACT EXTRACTION W/ INTRAOCULAR LENS IMPLANT Left   . CYSTOSCOPY  WITH HYDRODISTENSION AND BIOPSY N/A 04/21/2013   Procedure: CYSTOSCOPY/BLADDER BIOPSY/HYDRODISTENSION;  Surgeon: Kathi LudwigSigmund I Tannenbaum, MD;  [facility];  Service: Urology;  Laterality: N/A;     OB History   No obstetric history on file.      Home Medications    Prior to Admission medications   Medication Sig Start Date End Date Taking? Authorizing Provider  Calcium Glycerophosphate (PRELIEF) 340 (65-50) MG (CA-P) TABS Take 1 capsule by mouth 3 (three) times daily before meals. 04/21/13   Jethro Bolusannenbaum, Sigmund, MD  diazepam (VALIUM) 5 MG tablet Place 5 mg vaginally as directed.    [provider]  HYDROCORTISONE, TOPICAL, 2 % LOTN Apply topically as needed.     [provider]  hydrOXYzine (ATARAX/VISTARIL) 10 MG tablet Take 1 tablet (10 mg total) by mouth 3 (three) times daily as needed for itching or anxiety (if medication causes sleepiness, may take 30mg  hs.). 04/21/13   Jethro Bolusannenbaum, Sigmund, MD  meloxicam (MOBIC) 15 MG tablet Take 15 mg by mouth daily.    [provider]  Meth-Hyo-M Bl-Na Phos-Ph Sal (URIBEL) 118 MG CAPS Take 1 capsule (118 mg total) by mouth 3 times/day as needed-between meals & bedtime. 04/21/13   Jethro Bolusannenbaum, Sigmund, MD  Multiple Vitamins-Minerals (CENTRUM SILVER ADULT 50+ PO) Take 1 tablet by mouth daily.    [provider]  oxyCODONE-acetaminophen (ROXICET) 5-325 MG per  tablet Take 1 tablet by mouth every 6 (six) hours as needed for pain. 04/21/13   Carolan Clines, MD  pentosan polysulfate (ELMIRON) 100 MG capsule Take 1 capsule (100 mg total) by mouth 3 (three) times daily before meals. 04/21/13   Carolan Clines, MD    Family History No family history on file.  Social History Social History   Tobacco Use  . Smoking status: Never Smoker  . Smokeless tobacco: Never Used  Substance Use Topics  . Alcohol use: No  . Drug use: No     Allergies   Cinnamon   Review of Systems Review of Systems   Constitutional: Negative for diaphoresis and fever.  HENT: Negative for sore throat.   Eyes: Negative for visual disturbance.  Respiratory: Positive for cough, sputum production and shortness of breath. Negative for hemoptysis and wheezing.   Cardiovascular: Positive for leg swelling. Negative for chest pain and syncope.  Gastrointestinal: Negative for abdominal pain and vomiting.  Genitourinary: Negative for dysuria.  Musculoskeletal: Negative for neck pain.  Skin: Positive for rash (sarcoid).  Neurological: Negative for headaches.     Physical Exam Updated Vital Signs BP (!) 148/99 (BP Location: Right Arm)   Pulse 92   Temp 97.9 F (36.6 C) (Oral)   Resp 18   Ht 4\' 11"  (1.499 m)   Wt 72.6 kg   SpO2 97%   BMI 32.32 kg/m   Physical Exam Vitals signs and nursing note reviewed.  Constitutional:      General: She is not in acute distress.    Appearance: She is well-developed.  HENT:     Head: Normocephalic and atraumatic.  Eyes:     Conjunctiva/sclera: Conjunctivae normal.  Neck:     Musculoskeletal: Neck supple.  Cardiovascular:     Rate and Rhythm: Normal rate and regular rhythm.     Heart sounds: No murmur.  Pulmonary:     Effort: Pulmonary effort is normal. No respiratory distress.     Breath sounds: Normal breath sounds.  Abdominal:     Palpations: Abdomen is soft.     Tenderness: There is no abdominal tenderness.  Musculoskeletal: Normal range of motion.     Right lower leg: She exhibits no tenderness. Edema present.     Left lower leg: She exhibits no tenderness. Edema present.  Skin:    General: Skin is warm and dry.     Capillary Refill: Capillary refill takes less than 2 seconds.     Findings: Rash (Vitiligo changes across her scalp line and some lower extremity lesions) present.  Neurological:     General: No focal deficit present.     Mental Status: She is alert. Mental status is at baseline.      ED Treatments / Results  Labs (all labs ordered  are listed, but only abnormal results are displayed) Labs Reviewed  BASIC METABOLIC PANEL - Abnormal; Notable for the following components:      Result Value   Glucose, Bld 110 (*)    Calcium 8.8 (*)    All other components within normal limits  CBC WITH DIFFERENTIAL/PLATELET - Abnormal; Notable for the following components:   Hemoglobin 11.3 (*)    Lymphs Abs 0.4 (*)    All other components within normal limits  BRAIN NATRIURETIC PEPTIDE - Abnormal; Notable for the following components:   B Natriuretic Peptide 446.6 (*)    All other components within normal limits  D-DIMER, QUANTITATIVE (NOT AT [facility]) - Abnormal; Notable for the following components:  D-Dimer, Quant 1.56 (*)    All other components within normal limits  TROPONIN I (HIGH SENSITIVITY) - Abnormal; Notable for the following components:   Troponin I (High Sensitivity) 22 (*)    All other components within normal limits  TROPONIN I (HIGH SENSITIVITY) - Abnormal; Notable for the following components:   Troponin I (High Sensitivity) 20 (*)    All other components within normal limits  SARS CORONAVIRUS 2 (HOSPITAL ORDER, PERFORMED IN [facility])    EKG EKG Interpretation  Date/Time:  Wednesday March 15 2019 10:20:04 EDT Ventricular Rate:  92 PR Interval:  126 QRS Duration: 86 QT Interval:  434 QTC Calculation: 536 R Axis:   83 Text Interpretation:  Sinus rhythm with Premature supraventricular complexes Left ventricular hypertrophy Prolonged QT Abnormal ECG no prior to compare with Confirmed by Meridee Score [phone]) on 03/15/2019 10:29:54 AM   Radiology Ct Angio Chest Pe W/cm &/or Wo Cm  Result Date: 03/15/2019 CLINICAL DATA:  Shortness of breath, cough, lower extremity swelling, rule out PE EXAM: CT ANGIOGRAPHY CHEST WITH CONTRAST TECHNIQUE: Multidetector CT imaging of the chest was performed using the standard protocol during bolus administration of intravenous contrast. Multiplanar CT image  reconstructions and MIPs were obtained to evaluate the vascular anatomy. CONTRAST:  OMNIPAQUE IOHEXOL 350 MG/ML SOLN COMPARISON:  Same day chest radiograph FINDINGS: Cardiovascular: Satisfactory opacification of the pulmonary arteries to the segmental level. No evidence of pulmonary embolism. Cardiomegaly. Trace pericardial effusion. Mediastinum/Nodes: Bulky bilateral hilar and mediastinal lymphadenopathy, largest right hilar lymph nodes measuring at least 3.2 x 2.7 cm. Enlarged left lobe of the thyroid, poorly imaged due to adjacent dense streak artifact from contrast bolus. Trachea, and esophagus demonstrate no significant findings. Lungs/Pleura: Multiple bilateral pulmonary nodules, largest in the right upper lobe measuring 1.0 cm (series 5, image 33). There is extensive geographic airspace attenuation suggesting small airways disease. Small, right greater than left bilateral pleural effusions. Upper Abdomen: No acute abnormality. Musculoskeletal: No chest wall abnormality. No acute or significant osseous findings. Review of the MIP images confirms the above findings. IMPRESSION: 1.  Negative examination for pulmonary embolism. 2. Bulky bilateral hilar and mediastinal lymphadenopathy, largest right hilar lymph nodes measuring at least 3.2 x 2.7 cm. By report, patient has known diagnosis of sarcoidosis. 3. Small, right greater than left pleural effusions with associated atelectasis or consolidation. 4. Multiple bilateral pulmonary nodules, largest in the right upper lobe measuring 1.0 cm (series 5, image 33). These are nonspecific. Recommend follow-up CT at 3-6 months to evaluate for initial stability if there is no prior imaging available to prove stability. 5. There is extensive geographic airspace attenuation suggesting air trapping and small airways disease. 6.  Cardiomegaly and trace pericardial effusion. Electronically Signed   By: Lauralyn Primes M.D.   On: 03/15/2019 14:13   Dg Chest Port 1 View   Result Date: 03/15/2019 CLINICAL DATA:  One week history of cough and shortness of breath. EXAM: PORTABLE CHEST 1 VIEW COMPARISON:  None. FINDINGS: Borderline cardiac enlargement given the AP projection and portable technique. Prominent mediastinal and hilar contours. Could not exclude adenopathy. Central vascular congestion and increased interstitial markings could suggest CHF. Suspect small bilateral pleural effusions, right greater than left with overlying atelectasis. IMPRESSION: 1. Suspect mild CHF with interstitial edema and small effusions. 2. Findings worrisome for mediastinal and hilar adenopathy. I would recommend a repeat two-view chest film once the patient's symptoms of improved to reassess this. A chest CT with contrast may be necessary.  3. No definite infiltrates. Electronically Signed   By: Rudie MeyerP.  Gallerani M.D.   On: 03/15/2019 12:20    Procedures Procedures (including critical care time)  Medications Ordered in ED Medications  iohexol (OMNIPAQUE) 350 MG/ML injection 100 mL (100 mLs Intravenous Contrast Given 03/15/19 1338)     Initial Impression / Assessment and Plan / ED Course  I have reviewed the triage vital signs and the nursing notes.  Pertinent labs & imaging results that were available during my care of the patient were reviewed by me and considered in my medical decision making (see chart for details).  Clinical Course as of Mar 15 1523  Wed Mar 15, 2019  1509 Patient's lab work showing an elevated d-dimer.  We got a CT that did not show any signs of PE.  It did comment upon some mediastinal adenopathy and some small airspace disease.  Troponin slightly elevated at 22.  Delta troponin 20.  BNP also elevated at 446 without any prior values to compare with.  Ambulated the patient in the department where her heart rate shot up to 130s and she was increasingly dyspneic.  Sats never got below 90% but she was very symptomatic.   [MB]    Clinical Course User Index [MB] Terrilee FilesButler,  Garvin Ellena C, MD        Final Clinical Impressions(s) / ED Diagnoses   Final diagnoses:  Dyspnea on exertion    ED Discharge Orders    None       Terrilee FilesButler, Mikyla Schachter C, MD 03/15/19 1525

## 2019-03-15 NOTE — ED Triage Notes (Signed)
C/o cough x 1 week, shortness of breath w exertion, lower extremity swelling  Just noticed

## 2019-03-15 NOTE — ED Notes (Signed)
After ambulating in the High approximately 75 steps patient states "my legs are getting weak."  Noticed increased HR to 130s from 90's resting. Small decreased in SpO2 in 94% from 97%. MD aware. Pt placed in wheelchair and placed back in bed. EKG redone and VS taken.  No other complications noted.

## 2019-03-15 NOTE — ED Notes (Signed)
Pt found standing up with HR 186 and in afib. Pt assisted back to bed and EKG repeated. EDP made aware

## 2019-03-15 NOTE — H&P (Signed)
Robin Estes AVW:[id number] DOB: 01/16/1946 DOA: 03/15/2019     PCP: [facility], [facility]   Outpatient Specialists:    Pulmonary  Dr.Ejaz, Geronimo RunningMuhammad Shakir, MD [facility] Pul[facility] Sees [facility].   Urology Dr.Tannebaum   Patient arrived to ER on 03/15/19 at 0958  Patient coming from: home Lives  With family   Chief Complaint:  Chief Complaint  Patient presents with   Shortness of Breath    HPI: Robin Estes is a 73 y.o. female with medical history significant of Osteopenia, Sarcoidosis, Chronic Interstitial Cystitis      Presented with   progressive cough worsening over the past week worsening shortness of breath with exertion as well as lower extremity edema which happened more lately And having intermittent cough sometimes clear sometimes productive of yellow sputum no associated fevers no chest pain no nausea vomiting or diarrhea.   She has known history of sarcoidosis mainly affecting her skin but also lungs vulva and eyes. He is supposed to be followed by pulmonology but have not seen him in few years. 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 She has been prescribed steroid creams for her skin involvement of sarcoidosis.  Infectious risk factors:  Reports  shortness of breath  Cough  In  ER RAPID COVID TEST NEGATIVE      Regarding pertinent Chronic problems:  History of chronic interstitial cystitis used to be on Elmiron Glaucoma of the right eye    While in ER: Patient ambulated noted to have heart rate go up in 130s And as high as 187 EKG was obtained showed A. fib with RVR patient was not aware of having that in the past  Patient was given a dose of Cardizem IV and then started on a drip CTA ordered showed no evidence of pulmonary embolism but showed evidence of pulmonary nodules consistent with prior history of sarcoidosis as well as pleural effusion and  pericardial effusion with cardiomegaly Ammonia noted to be slightly elevated but no change no ischemic changes on EKG and no chest pain  The following Work up has been ordered so far:  Orders Placed This Encounter  Procedures   SARS Coronavirus 2 (CEPHEID - Performed in [facility]), Hosp Order   MRSA PCR Screening   DG Chest Port 1 View   CT Angio Chest PE W/Cm &/Or Wo Cm   Basic metabolic panel   CBC with Differential   Brain natriuretic peptide   D-dimer, quantitative (not at [facility])   Urinalysis, Routine w reflex microscopic   Angiotensin converting enzyme   TSH   Lipid panel   Hemoglobin A1c   Magnesium   Phosphorus   Comprehensive metabolic panel   CBC   Diet Heart Room service appropriate? Yes; Fluid consistency: Thin   Cardiac monitoring   Check Pulse Oximetry while ambulating   Swab Process:   Considerations:   If MRSA PCR Screen is Positive:   Vital signs   Notify physician (specify)   Activity per Heart Failure Clinical Pathway   Initiate Heart Failure clinical pathway   Weigh patient on admission   Daily weights   Strict intake and output   In and Out Cath   Patient education (specify):   Initiate Oral Care Protocol   Initiate Carrier Fluid Protocol   RN may order Cardiology PRN Orders utilizing "Cardiology PRN medications" (through manage orders) for the following patient needs:   Vital signs  Notify physician   Up with assistance   If patient diabetic or glucose greater than 140 notify physician for Sliding Scale Insulin Orders   May go off telemetry for tests/procedures   Oral care per nursing protocol   Initiate Oral Care Protocol   Initiate Carrier Fluid Protocol   RN may order General Admission PRN Orders utilizing "General Admission PRN medications" (through manage orders) for the following patient needs: allergy symptoms (Claritin), cold sores (Carmex), cough (Robitussin DM), eye irritation  (Liquifilm Tears), hemorrhoids (Tucks), indigestion (Maalox), minor skin irritation (Hydrocortisone Cream), muscle pain Romeo Apple Gay), nose irritation (saline nasal spray) and sore throat (Chloraseptic spray).   Patient has an active order for admit to inpatient/place in observation   Cardiac Monitoring - Continuous Indefinite   No ACE Inhibitor / ARB   Place TED hose   Patient has an active order for admit to inpatient/place in observation   Check Pulse Oximetry while ambulating   Full code   Consult to hospitalist  ALL PATIENTS BEING ADMITTED/HAVING PROCEDURES NEED COVID-19 SCREENING   Consult to case management   Droplet precaution   OT eval and treat   PT eval and treat   Pulse oximetry (single)   Oxygen therapy Mode or (Route): Nasal cannula; Keep 02 saturation: >/= 92%   Pulse oximetry check with vital signs   Oxygen therapy Mode or (Route): Nasal cannula; Liters Per Minute: 2; Keep 02 saturation: greater than 92 %   Incentive spirometry   EKG 12-Lead   Repeat EKG   EKG 12-Lead   EKG 12-Lead   EKG 12-Lead   12 lead EKG   EKG 12-Lead   EKG 12-Lead   ECHOCARDIOGRAM COMPLETE   Insert peripheral IV   Insert peripheral IV   Place in observation (patient's expected length of stay will be less than 2 midnights)   Place in observation (patient's expected length of stay will be less than 2 midnights)    Following Medications were ordered in ER: Medications  ipratropium (ATROVENT) nebulizer solution 0.5 mg (0.5 mg Nebulization Not Given 03/15/19 1646)  diltiazem (CARDIZEM) 100 MG injection (has no administration in time range)  diltiazem (CARDIZEM) 100 MG injection (has no administration in time range)  diltiazem (CARDIZEM) 100 mg in dextrose 5 % 100 mL (1 mg/mL) infusion (has no administration in time range)  furosemide (LASIX) injection 40 mg (has no administration in time range)  sodium chloride flush (NS) 0.9 % injection 3 mL (has no administration in  time range)  sodium chloride flush (NS) 0.9 % injection 3 mL (has no administration in time range)  0.9 %  sodium chloride infusion (has no administration in time range)  acetaminophen (TYLENOL) tablet 650 mg (has no administration in time range)  enoxaparin (LOVENOX) injection 40 mg (has no administration in time range)  furosemide (LASIX) injection 40 mg (has no administration in time range)  sodium chloride flush (NS) 0.9 % injection 3 mL (has no administration in time range)  sodium chloride flush (NS) 0.9 % injection 3 mL (has no administration in time range)  0.9 %  sodium chloride infusion (has no administration in time range)  albuterol (PROVENTIL) (2.5 MG/3ML) 0.083% nebulizer solution 2.5 mg (has no administration in time range)  guaiFENesin (MUCINEX) 12 hr tablet 600 mg (has no administration in time range)  iohexol (OMNIPAQUE) 350 MG/ML injection 100 mL (100 mLs Intravenous Contrast Given 03/15/19 1338)  furosemide (LASIX) injection 40 mg (40 mg Intravenous Given 03/15/19 1535)  diltiazem (CARDIZEM) injection  20 mg (10 mg Intravenous Given 03/15/19 1616)  diltiazem (CARDIZEM CD) 24 hr capsule 120 mg (120 mg Oral Given 03/15/19 1629)        Consult Orders  (From admission, onward)         Start     Ordered   03/15/19 1950  PT eval and treat  Routine     03/15/19 1949   03/15/19 1950  OT eval and treat  Routine    Question:  Reason for OT?  Answer:  eval for safe discharge   03/15/19 1949   03/15/19 1932  Consult to case management  Once    Comments: Heart failure home health screen and may place order for PT/OT eval and treat if indicated.  Provider:  (Not yet assigned)  Question:  Reason for consult:  Answer:  Other (see comments)   03/15/19 1937   03/15/19 1511  Consult to hospitalist  ALL PATIENTS BEING ADMITTED/HAVING PROCEDURES NEED COVID-19 SCREENING Call made via [facility], spoke with Doug @ 315  Once    Comments: ALL PATIENTS BEING ADMITTED/HAVING PROCEDURES NEED  COVID-19 SCREENING  Provider:  (Not yet assigned)  Question Answer Comment  Place call to: [facility]   Reason for Consult Admit      03/15/19 1510          Significant initial  Findings: Abnormal Labs Reviewed  BASIC METABOLIC PANEL - Abnormal; Notable for the following components:      Result Value   Glucose, Bld 110 (*)    Calcium 8.8 (*)    All other components within normal limits  CBC WITH DIFFERENTIAL/PLATELET - Abnormal; Notable for the following components:   Hemoglobin 11.3 (*)    Lymphs Abs 0.4 (*)    All other components within normal limits  BRAIN NATRIURETIC PEPTIDE - Abnormal; Notable for the following components:   B Natriuretic Peptide 446.6 (*)    All other components within normal limits  D-DIMER, QUANTITATIVE (NOT AT [facility]) - Abnormal; Notable for the following components:   D-Dimer, Quant 1.56 (*)    All other components within normal limits  TROPONIN I (HIGH SENSITIVITY) - Abnormal; Notable for the following components:   Troponin I (High Sensitivity) 22 (*)    All other components within normal limits  TROPONIN I (HIGH SENSITIVITY) - Abnormal; Notable for the following components:   Troponin I (High Sensitivity) 20 (*)    All other components within normal limits     Otherwise labs showing:    Recent Labs  Lab 03/15/19 1206  NA 143  K 3.6  CO2 25  GLUCOSE 110*  BUN 10  CREATININE 0.73  CALCIUM 8.8*    Cr   Stable,  Lab Results  Component Value Date   CREATININE 0.73 03/15/2019    No results for input(s): AST, ALT, ALKPHOS, BILITOT, PROT, ALBUMIN in the last 168 hours. Lab Results  Component Value Date   CALCIUM 8.8 (L) 03/15/2019     WBC       Component Value Date/Time   WBC 4.7 03/15/2019 1206   ANC    Component Value Date/Time   NEUTROABS 3.8 03/15/2019 1206     Plt: Lab Results  Component Value Date   PLT 276 03/15/2019     COVID-19 Labs  Recent Labs    03/15/19 1206  DDIMER 1.56*    Lab Results    Component Value Date   SARSCOV2NAA NEGATIVE 03/15/2019    HG/HCT  stable,  Component Value Date/Time   HGB 11.3 (L) 03/15/2019 1206   HCT 37.2 03/15/2019 1206    Troponin 22->20    BNP (last 3 results) Recent Labs    03/15/19 1206  BNP 446.6*    UA  ordered     CXR - mild CHF with interstitial edema, pleural effusions R>L    CTA chest -   no PE, bilateral nodules, adenopathy small right greater than left pleural effusion could be atelectasis versus consolidation cardioMegaly and trace pericardial effusion  ECG:  Personally reviewed by me showing: HR :160 Rhythm:   Sinus tachycardia with atrial ectopy  no evidence of ischemic changes QTC 504      ED Triage Vitals  Enc Vitals Group     BP 03/15/19 1010 (!) 148/99     Pulse Rate 03/15/19 1010 92     Resp 03/15/19 1010 18     Temp 03/15/19 1010 97.9 F (36.6 C)     Temp Source 03/15/19 1010 Oral     SpO2 03/15/19 1010 97 %     Weight 03/15/19 1011 160 lb (72.6 kg)     Height 03/15/19 1011 4\' 11"  (1.499 m)     Head Circumference --      Peak Flow --      Pain Score 03/15/19 1022 0     Pain Loc --      Pain Edu? --      Excl. in GC? --   TMAX(24)@       Latest  Blood pressure (!) 176/64, pulse 91, temperature 97.9 F (36.6 C), temperature source Oral, resp. rate (!) 25, height 4\' 11"  (1.499 m), weight 72.6 kg, SpO2 97 %.     Hospitalist was called for admission for fluid overload possible new onset CHF in the setting of history of sarcoidosis and elevated troponin with tachycardia Bilateral lower extremity  swelling     Review of Systems:    Pertinent positives include:   Fatigue shortness of breath at rest dyspnea on exertion productive cough, Constitutional:  No weight loss, night sweats, Fevers, chills,, weight loss  HEENT:  No headaches, Difficulty swallowing,Tooth/dental problems,Sore throat,  No sneezing, itching, ear ache, nasal congestion, post nasal drip,  Cardio-vascular:  No chest  pain, Orthopnea, PND, anasarca, dizziness, palpitations. GI:  No heartburn, indigestion, abdominal pain, nausea, vomiting, diarrhea, change in bowel habits, loss of appetite, melena, blood in stool, hematemesis Resp:    No excess mucus,  No non-productive cough, No coughing up of blood.No change in color of mucus.No wheezing. Skin:  no rash or lesions. No jaundice GU:  no dysuria, change in color of urine, no urgency or frequency. No straining to urinate.  No flank pain.  Musculoskeletal:  No joint pain or no joint swelling. No decreased range of motion. No back pain.  Psych:  No change in mood or affect. No depression or anxiety. No memory loss.  Neuro: no localizing neurological complaints, no tingling, no weakness, no double vision, no gait abnormality, no slurred speech, no confusion  All systems reviewed and apart from HOPI all are negative  Past Medical History:   Past Medical History:  Diagnosis Date   Bladder spasms    Chronic interstitial cystitis    Dysuria    End-stage glaucoma    RIGHT EYE   Frequency of urination    Legally blind in right eye, as [place] in [place]    SECONDARY TO GLAUCOMA   Nocturia    Sarcoidosis of  skin    Sensation of pressure in bladder area    Urgency of urination       Past Surgical History:  Procedure Laterality Date   CATARACT EXTRACTION W/ INTRAOCULAR LENS IMPLANT Left    CYSTOSCOPY WITH HYDRODISTENSION AND BIOPSY N/A 04/21/2013   Procedure: CYSTOSCOPY/BLADDER BIOPSY/HYDRODISTENSION;  Surgeon: Ailene Rud, MD;  Location[facility];  Service: Urology;  Laterality: N/A;    Social History:  Ambulatory   independently       reports that she has never smoked. She has never used smokeless tobacco. She reports that she does not drink alcohol or use drugs.     Family History:   Family History  Problem Relation Age of Onset   CAD Father    Sarcoidosis Other     Allergies: Allergies    Allergen Reactions   Cinnamon Other (See Comments)    MOUTH ULCERS     Prior to Admission medications   Medication Sig Start Date End Date Taking? Authorizing Provider  Calcium Glycerophosphate (PRELIEF) 340 (65-50) MG (CA-P) TABS Take 1 capsule by mouth 3 (three) times daily before meals. 04/21/13   Carolan Clines, MD  diazepam (VALIUM) 5 MG tablet Place 5 mg vaginally as directed.    [provider]  HYDROCORTISONE, TOPICAL, 2 % LOTN Apply topically as needed.     [provider]  hydrOXYzine (ATARAX/VISTARIL) 10 MG tablet Take 1 tablet (10 mg total) by mouth 3 (three) times daily as needed for itching or anxiety (if medication causes sleepiness, may take 30mg  hs.). 04/21/13   Carolan Clines, MD  meloxicam (MOBIC) 15 MG tablet Take 15 mg by mouth daily.    [provider]  Meth-Hyo-M Bl-Na Phos-Ph Sal (URIBEL) 118 MG CAPS Take 1 capsule (118 mg total) by mouth 3 times/day as needed-between meals & bedtime. 04/21/13   Carolan Clines, MD  Multiple Vitamins-Minerals (CENTRUM SILVER ADULT 50+ PO) Take 1 tablet by mouth daily.    [provider]  oxyCODONE-acetaminophen (ROXICET) 5-325 MG per tablet Take 1 tablet by mouth every 6 (six) hours as needed for pain. 04/21/13   Carolan Clines, MD  pentosan polysulfate (ELMIRON) 100 MG capsule Take 1 capsule (100 mg total) by mouth 3 (three) times daily before meals. 04/21/13   Carolan Clines, MD   Physical Exam: Blood pressure (!) 176/64, pulse 91, temperature 97.9 F (36.6 C), temperature source Oral, resp. rate (!) 25, height 4\' 11"  (1.499 m), weight 72.6 kg, SpO2 97 %. 1. General:  in No  Acute distress    Chronically ill   -appearing 2. Psychological: Alert and  Oriented 3. Head/ENT:   Moist  Mucous Membranes                          Head Non traumatic, neck supple                         Poor Dentition 4. SKIN: normal  Skin turgor,  Skin clean Dry and intact multiple lesions  consistent with sarcoidosis 5. Heart: Regular rate and rhythm no Murmur, no Rub or gallop 6. Lungs: , no wheezes or crackles   7. Abdomen: Soft,  non-tender,   distended  bowel sounds present 8. Lower extremities: no clubbing, cyanosis, trace edema 9. Neurologically Grossly intact, moving all 4 extremities equally  10. MSK: Normal range of motion   All other LABS:     Recent  Labs  Lab 03/15/19 1206  WBC 4.7  NEUTROABS 3.8  HGB 11.3*  HCT 37.2  MCV 92.8  PLT 276     Recent Labs  Lab 03/15/19 1206  NA 143  K 3.6  CL 106  CO2 25  GLUCOSE 110*  BUN 10  CREATININE 0.73  CALCIUM 8.8*     No results for input(s): AST, ALT, ALKPHOS, BILITOT, PROT, ALBUMIN in the last 168 hours.     Cultures: No results found for: SDES, SPECREQUEST, CULT, REPTSTATUS   Radiological Exams on Admission: Ct Angio Chest Pe W/cm &/or Wo Cm  Result Date: 03/15/2019 CLINICAL DATA:  Shortness of breath, cough, lower extremity swelling, rule out PE EXAM: CT ANGIOGRAPHY CHEST WITH CONTRAST TECHNIQUE: Multidetector CT imaging of the chest was performed using the standard protocol during bolus administration of intravenous contrast. Multiplanar CT image reconstructions and MIPs were obtained to evaluate the vascular anatomy. CONTRAST:  OMNIPAQUE IOHEXOL 350 MG/ML SOLN COMPARISON:  Same day chest radiograph FINDINGS: Cardiovascular: Satisfactory opacification of the pulmonary arteries to the segmental level. No evidence of pulmonary embolism. Cardiomegaly. Trace pericardial effusion. Mediastinum/Nodes: Bulky bilateral hilar and mediastinal lymphadenopathy, largest right hilar lymph nodes measuring at least 3.2 x 2.7 cm. Enlarged left lobe of the thyroid, poorly imaged due to adjacent dense streak artifact from contrast bolus. Trachea, and esophagus demonstrate no significant findings. Lungs/Pleura: Multiple bilateral pulmonary nodules, largest in the right upper lobe measuring 1.0 cm (series 5, image 33).  There is extensive geographic airspace attenuation suggesting small airways disease. Small, right greater than left bilateral pleural effusions. Upper Abdomen: No acute abnormality. Musculoskeletal: No chest wall abnormality. No acute or significant osseous findings. Review of the MIP images confirms the above findings. IMPRESSION: 1.  Negative examination for pulmonary embolism. 2. Bulky bilateral hilar and mediastinal lymphadenopathy, largest right hilar lymph nodes measuring at least 3.2 x 2.7 cm. By report, patient has known diagnosis of sarcoidosis. 3. Small, right greater than left pleural effusions with associated atelectasis or consolidation. 4. Multiple bilateral pulmonary nodules, largest in the right upper lobe measuring 1.0 cm (series 5, image 33). These are nonspecific. Recommend follow-up CT at 3-6 months to evaluate for initial stability if there is no prior imaging available to prove stability. 5. There is extensive geographic airspace attenuation suggesting air trapping and small airways disease. 6.  Cardiomegaly and trace pericardial effusion. Electronically Signed   By: Lauralyn Primes M.D.   On: 03/15/2019 14:13   Dg Chest Port 1 View  Result Date: 03/15/2019 CLINICAL DATA:  One week history of cough and shortness of breath. EXAM: PORTABLE CHEST 1 VIEW COMPARISON:  None. FINDINGS: Borderline cardiac enlargement given the AP projection and portable technique. Prominent mediastinal and hilar contours. Could not exclude adenopathy. Central vascular congestion and increased interstitial markings could suggest CHF. Suspect small bilateral pleural effusions, right greater than left with overlying atelectasis. IMPRESSION: 1. Suspect mild CHF with interstitial edema and small effusions. 2. Findings worrisome for mediastinal and hilar adenopathy. I would recommend a repeat two-view chest film once the patient's symptoms of improved to reassess this. A chest CT with contrast may be necessary. 3. No  definite infiltrates. Electronically Signed   By: Rudie Meyer M.D.   On: 03/15/2019 12:20    Chart has been reviewed    Assessment/Plan  73 y.o. female with medical history significant of Osteopenia, Sarcoidosis, Chronic Interstitial Cystitis  Admitted for  fluid overload possible new onset CHF in the setting of history of  sarcoidosis and elevated troponin with tachycardia  Present on Admission:  SOB (shortness of breath) -most likely secondary to fluid overload with pulmonary edema presumptive secondary to he new onset heart failure will obtain echogram to evaluate type of heart failure  Sarcoidosis-chronic with mild pulmonary complications such as nodularity and hilar lymphadenopathy.  Discussed with pulmonology in the morning if shortness of breath can be explained by sarcoidosis.  Patient will need to continue follow-up with pulmonology as she did in the past obtain a status.  Defer to cardiology follow-up cardiac work-up will be needed given history of sarcoidosis  Pleural effusion -mild likely secondary to fluid overload will diurese and monitor if there is evidence of underlying pulmonary consolidation will repeat chest x-ray after the patient is diuresed   Elevated troponin in the setting of fluid overload likely demand ischemia we will continue to monitor appreciate cardiology input order echogram E stratify with hemoglobin A1c and lipid panel  Cardiomegaly obtain echogram to further evaluate  Lymphadenopathy, hilar most likely secondary to sarcoidosis  Chronic interstitial cystitis stable  Sinus tachycardia by electrocardiogram -in the setting of patient ambulating review of EKG showed no evidence of A. fib but there is a lot of atrial ectopy.  Will monitor patient on telemetry to see if she develops any further worrisome rhythm.  For now we will try to wean down diltiazem.  Await results of cardiac echogram appreciate cardiology input. Check TSH  Prolonged QT interval --  will monitor on tele avoid QT prolonging medications, rehydrate correct electrolytes    Other plan as per orders.  DVT prophylaxis:   Lovenox     Code Status:  FULL CODE as per patient    I had personally discussed CODE STATUS with patient  Family Communication:   Family not at  Bedside    Disposition Plan:       To home once workup is complete and patient is stable                      Would benefit from PT/OT eval prior to DC  Ordered                                        Consults called: cardiology aware will see in consult in AM  Admission status:  ED Disposition    ED Disposition Condition Comment   Admit  [facility]ospital [facility] [[id number]]  Level of Care: Stepdown [14]  Admit to SDU based on following criteria: Hemodynamic compromise or significant risk of instability:  Patient requiring short term acute titration and management of vasoactive drips, and invasive monitoring (i.e., CVP and Arterial line).  Covid Evaluation: Confirmed COVID Negative  Diagnosis: SOB (shortness of breath) [161096]  Admitting Physician: Zigmund Daniel [phone]  Attending Physician: Shaune Spittle, A CALDWELL [phone]  PT Class (Do Not Modify): Observation [104]  PT Acc Code (Do Not Modify): Observation [10022]       Obs    Level of care       SDU tele indefinitely please discontinue once patient no longer qualifies  Precautions:   Droplet  Droplet precaution  PPE: Used by the provider:   P100  eye Goggles,  Gloves     Mikaia Janvier 03/15/2019, 8:11 PM    [facility]     after 2 AM please page floor coverage PA If  7AM-7PM, please contact the day team taking care of the patient using [email or web address]

## 2019-03-15 NOTE — ED Notes (Signed)
Robin Estes (Daughter) - [phone] Robin Estes (Husband) - [phone]

## 2019-03-15 NOTE — ED Notes (Signed)
[facility] loading pt up to transfer to [facility]. Pt appears to be less anxious. Continues to be in A-fib with HR at 104.

## 2019-03-15 NOTE — ED Notes (Signed)
ED Provider at bedside discussing test results and dispo plan of care. 

## 2019-03-15 NOTE — ED Provider Notes (Signed)
Pt is being admitted.  Notified that her heart rate has increased suddenly.  Monitor suggests a fib with RVR.  Will  Give a dose of cardizem and continue to monitor closely.  HR has remained elevated.   Will start cardizem infusion.  Dr Florene Glen notified.  WIll admit to stepdown.  Pt continues to breathe easily.  Part of her discomfort she states is the urge to urinate.  She wants to be able to stand up.  Will allow her to do so once infusion has been started   Dorie Rank, MD 03/15/19 1650

## 2019-03-15 NOTE — ED Notes (Signed)
Per EDP- another 10mg  IV cardizem administered

## 2019-03-16 ENCOUNTER — Observation Stay (HOSPITAL_COMMUNITY): Payer: Medicare Other

## 2019-03-16 DIAGNOSIS — I361 Nonrheumatic tricuspid (valve) insufficiency: Secondary | ICD-10-CM

## 2019-03-16 DIAGNOSIS — D8689 Sarcoidosis of other sites: Secondary | ICD-10-CM | POA: Diagnosis present

## 2019-03-16 DIAGNOSIS — Z79899 Other long term (current) drug therapy: Secondary | ICD-10-CM | POA: Diagnosis not present

## 2019-03-16 DIAGNOSIS — E785 Hyperlipidemia, unspecified: Secondary | ICD-10-CM | POA: Diagnosis present

## 2019-03-16 DIAGNOSIS — I5041 Acute combined systolic (congestive) and diastolic (congestive) heart failure: Secondary | ICD-10-CM

## 2019-03-16 DIAGNOSIS — D649 Anemia, unspecified: Secondary | ICD-10-CM | POA: Diagnosis present

## 2019-03-16 DIAGNOSIS — I471 Supraventricular tachycardia: Secondary | ICD-10-CM | POA: Diagnosis present

## 2019-03-16 DIAGNOSIS — Z6832 Body mass index (BMI) 32.0-32.9, adult: Secondary | ICD-10-CM | POA: Diagnosis not present

## 2019-03-16 DIAGNOSIS — R590 Localized enlarged lymph nodes: Secondary | ICD-10-CM | POA: Diagnosis present

## 2019-03-16 DIAGNOSIS — I340 Nonrheumatic mitral (valve) insufficiency: Secondary | ICD-10-CM | POA: Diagnosis present

## 2019-03-16 DIAGNOSIS — R0609 Other forms of dyspnea: Secondary | ICD-10-CM | POA: Diagnosis present

## 2019-03-16 DIAGNOSIS — R739 Hyperglycemia, unspecified: Secondary | ICD-10-CM | POA: Diagnosis present

## 2019-03-16 DIAGNOSIS — I248 Other forms of acute ischemic heart disease: Secondary | ICD-10-CM | POA: Diagnosis present

## 2019-03-16 DIAGNOSIS — Z1159 Encounter for screening for other viral diseases: Secondary | ICD-10-CM | POA: Diagnosis not present

## 2019-03-16 DIAGNOSIS — E669 Obesity, unspecified: Secondary | ICD-10-CM | POA: Diagnosis present

## 2019-03-16 DIAGNOSIS — N3010 Interstitial cystitis (chronic) without hematuria: Secondary | ICD-10-CM | POA: Diagnosis present

## 2019-03-16 DIAGNOSIS — T501X5A Adverse effect of loop [high-ceiling] diuretics, initial encounter: Secondary | ICD-10-CM | POA: Diagnosis present

## 2019-03-16 DIAGNOSIS — Z8249 Family history of ischemic heart disease and other diseases of the circulatory system: Secondary | ICD-10-CM | POA: Diagnosis not present

## 2019-03-16 DIAGNOSIS — I110 Hypertensive heart disease with heart failure: Secondary | ICD-10-CM | POA: Diagnosis present

## 2019-03-16 DIAGNOSIS — M8580 Other specified disorders of bone density and structure, unspecified site: Secondary | ICD-10-CM | POA: Diagnosis present

## 2019-03-16 DIAGNOSIS — I4719 Other supraventricular tachycardia: Secondary | ICD-10-CM

## 2019-03-16 DIAGNOSIS — H548 Legal blindness, as defined in USA: Secondary | ICD-10-CM | POA: Diagnosis present

## 2019-03-16 LAB — COMPREHENSIVE METABOLIC PANEL
ALT: 14 U/L (ref 0–44)
AST: 27 U/L (ref 15–41)
Albumin: 3.3 g/dL — ABNORMAL LOW (ref 3.5–5.0)
Alkaline Phosphatase: 112 U/L (ref 38–126)
Anion gap: 10 (ref 5–15)
BUN: 9 mg/dL (ref 8–23)
CO2: 27 mmol/L (ref 22–32)
Calcium: 8.1 mg/dL — ABNORMAL LOW (ref 8.9–10.3)
Chloride: 103 mmol/L (ref 98–111)
Creatinine, Ser: 0.7 mg/dL (ref 0.44–1.00)
GFR calc Af Amer: >60 mL/min (ref >60)
GFR calc non Af Amer: >60 mL/min (ref >60)
Glucose, Bld: 91 mg/dL (ref 70–99)
Potassium: 2.6 mmol/L — CL (ref 3.5–5.1)
Sodium: 140 mmol/L (ref 135–145)
Total Bilirubin: 0.6 mg/dL (ref 0.3–1.2)
Total Protein: 6.1 g/dL — ABNORMAL LOW (ref 6.5–8.1)

## 2019-03-16 LAB — CBC
HCT: 36.3 % (ref 36.0–46.0)
Hemoglobin: 11 g/dL — ABNORMAL LOW (ref 12.0–15.0)
MCH: 27.8 pg (ref 26.0–34.0)
MCHC: 30.3 g/dL (ref 30.0–36.0)
MCV: 91.7 fL (ref 80.0–100.0)
Platelets: 269 10*3/uL (ref 150–400)
RBC: 3.96 MIL/uL (ref 3.87–5.11)
RDW: 14.1 % (ref 11.5–15.5)
WBC: 4.6 10*3/uL (ref 4.0–10.5)
nRBC: 0 % (ref 0.0–0.2)

## 2019-03-16 LAB — BASIC METABOLIC PANEL
Anion gap: 9 (ref 5–15)
BUN: 10 mg/dL (ref 8–23)
CO2: 28 mmol/L (ref 22–32)
Calcium: 8.7 mg/dL — ABNORMAL LOW (ref 8.9–10.3)
Chloride: 102 mmol/L (ref 98–111)
Creatinine, Ser: 1.04 mg/dL — ABNORMAL HIGH (ref 0.44–1.00)
GFR calc Af Amer: >60 mL/min (ref >60)
GFR calc non Af Amer: 53 mL/min — ABNORMAL LOW (ref >60)
Glucose, Bld: 125 mg/dL — ABNORMAL HIGH (ref 70–99)
Potassium: 4.2 mmol/L (ref 3.5–5.1)
Sodium: 139 mmol/L (ref 135–145)

## 2019-03-16 LAB — URINALYSIS, ROUTINE W REFLEX MICROSCOPIC
Bacteria, UA: NONE SEEN
Bilirubin Urine: NEGATIVE
Glucose, UA: NEGATIVE mg/dL
Hgb urine dipstick: NEGATIVE
Ketones, ur: NEGATIVE mg/dL
Leukocytes,Ua: NEGATIVE
Nitrite: NEGATIVE
Protein, ur: NEGATIVE mg/dL
Specific Gravity, Urine: 1.018 (ref 1.005–1.030)
pH: 6 (ref 5.0–8.0)

## 2019-03-16 LAB — MAGNESIUM: Magnesium: 1.7 mg/dL (ref 1.7–2.4)

## 2019-03-16 LAB — LIPID PANEL
Cholesterol: 163 mg/dL (ref 0–200)
HDL: 37 mg/dL — ABNORMAL LOW (ref >40)
LDL Cholesterol: 112 mg/dL — ABNORMAL HIGH (ref 0–99)
Total CHOL/HDL Ratio: 4.4 RATIO
Triglycerides: 69 mg/dL (ref <150)
VLDL: 14 mg/dL (ref 0–40)

## 2019-03-16 LAB — ECHOCARDIOGRAM COMPLETE
Height: 59 in
Weight: 2560 oz

## 2019-03-16 LAB — HEMOGLOBIN A1C
Hgb A1c MFr Bld: 5.6 % (ref 4.8–5.6)
Mean Plasma Glucose: 114.02 mg/dL

## 2019-03-16 LAB — PHOSPHORUS: Phosphorus: 3.1 mg/dL (ref 2.5–4.6)

## 2019-03-16 MED ORDER — URELLE 81 MG PO TABS
1.0000 | ORAL_TABLET | Freq: Two times a day (BID) | ORAL | Status: DC | PRN
  Filled 2019-03-16: qty 1

## 2019-03-16 MED ORDER — POTASSIUM CHLORIDE CRYS ER 20 MEQ PO TBCR
40.0000 meq | EXTENDED_RELEASE_TABLET | Freq: Once | ORAL | Status: AC
  Filled 2019-03-16: qty 2

## 2019-03-16 MED ORDER — ONDANSETRON HCL 4 MG/2ML IJ SOLN: 4.0000 mg | Freq: Three times a day (TID) | INTRAMUSCULAR | Status: DC | PRN

## 2019-03-16 MED ORDER — POTASSIUM CHLORIDE CRYS ER 20 MEQ PO TBCR
40.0000 meq | EXTENDED_RELEASE_TABLET | Freq: Two times a day (BID) | ORAL | Status: AC
  Administered 2019-03-16 (×2): 40 meq via ORAL
  Filled 2019-03-16: qty 2

## 2019-03-16 MED ORDER — POTASSIUM CHLORIDE CRYS ER 20 MEQ PO TBCR
40.0000 meq | EXTENDED_RELEASE_TABLET | Freq: Once | ORAL | Status: AC
  Administered 2019-03-16: 04:00:00 40 meq via ORAL
  Filled 2019-03-16: qty 2

## 2019-03-16 MED ORDER — METOPROLOL TARTRATE 12.5 MG HALF TABLET
12.5000 mg | ORAL_TABLET | Freq: Four times a day (QID) | ORAL | Status: DC
  Administered 2019-03-16 (×2): 12.5 mg via ORAL
  Filled 2019-03-16 (×2): qty 1

## 2019-03-16 MED ORDER — PENTOSAN POLYSULFATE SODIUM 100 MG PO CAPS: 100.0000 mg | ORAL_CAPSULE | Freq: Three times a day (TID) | ORAL | Status: DC

## 2019-03-16 MED ORDER — CHLORHEXIDINE GLUCONATE CLOTH 2 % EX PADS
6.0000 | MEDICATED_PAD | Freq: Every day | CUTANEOUS | Status: DC
  Administered 2019-03-16 – 2019-03-18 (×3): 6 via TOPICAL

## 2019-03-16 MED ORDER — MAGNESIUM SULFATE 4 GM/100ML IV SOLN
4.0000 g | Freq: Once | INTRAVENOUS | Status: AC
  Administered 2019-03-16: 4 g via INTRAVENOUS
  Filled 2019-03-16: qty 100

## 2019-03-16 MED ORDER — ATORVASTATIN CALCIUM 10 MG PO TABS
20.0000 mg | ORAL_TABLET | Freq: Every day | ORAL | Status: DC
  Administered 2019-03-16 – 2019-03-17 (×2): 20 mg via ORAL
  Filled 2019-03-16 (×2): qty 2

## 2019-03-16 MED ORDER — METOCLOPRAMIDE HCL 5 MG/ML IJ SOLN: 5.0000 mg | Freq: Three times a day (TID) | INTRAMUSCULAR | Status: DC | PRN

## 2019-03-16 MED ORDER — HYDROXYZINE HCL 10 MG PO TABS: 10.0000 mg | ORAL_TABLET | Freq: Three times a day (TID) | ORAL | Status: DC | PRN

## 2019-03-16 MED ORDER — POTASSIUM CHLORIDE 10 MEQ/100ML IV SOLN
10.0000 meq | INTRAVENOUS | Status: AC
  Administered 2019-03-16 (×3): 10 meq via INTRAVENOUS
  Filled 2019-03-16 (×3): qty 100

## 2019-03-16 MED ORDER — ONDANSETRON HCL 4 MG/2ML IJ SOLN
INTRAMUSCULAR | Status: AC
  Administered 2019-03-16: 4 mg
  Filled 2019-03-16: qty 2

## 2019-03-16 MED ORDER — URIBEL 118 MG PO CAPS: 1.0000 | ORAL_CAPSULE | Freq: Two times a day (BID) | ORAL | Status: DC | PRN

## 2019-03-16 MED ORDER — DIAZEPAM 5 MG PO TABS: 5.0000 mg | ORAL_TABLET | ORAL | Status: DC

## 2019-03-16 NOTE — Progress Notes (Signed)
PROGRESS NOTE    Robin Estes  ZOX:[id number] DOB: 08/11/1946 DOA: 03/15/2019 PCP: [facility], [facility]   Brief Narrative:  HPI per Dr. Karen KaysAnastassia Duotova on 03/15/2019  Robin RompMargaret Estes is a 73 y.o. female with medical history significant of Osteopenia, Sarcoidosis, Chronic Interstitial Cystitis      Presented with   progressive cough worsening over the past week worsening shortness of breath with exertion as well as lower extremity edema which happened more lately And having intermittent cough sometimes clear sometimes productive of yellow sputum no associated fevers no chest pain no nausea vomiting or diarrhea.   She has known history of sarcoidosis mainly affecting her skin but also lungs vulva and eyes. He is supposed to be followed by pulmonology but have not seen him in few years. 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 She has been prescribed steroid creams for her skin involvement of sarcoidosis.  Infectious risk factors:  Reports  shortness of breath  Cough  In  ER RAPID COVID TEST NEGATIVE      Regarding pertinent Chronic problems:  History of chronic interstitial cystitis used to be on [place] Glaucoma of the right eye    While in ER: Patient ambulated noted to have heart rate go up in 130s And as high as 187 EKG was obtained showed A. fib with RVR patient was not aware of having that in the past  Patient was given a dose of Cardizem IV and then started on a drip CTA ordered showed no evidence of pulmonary embolism but showed evidence of pulmonary nodules consistent with prior history of sarcoidosis as well as pleural effusion and pericardial effusion with cardiomegaly Ammonia noted to be slightly elevated but no change no ischemic changes on EKG and no chest pain  **Interim History  She was started on diuresis is been started improving and her shortness of breath is improving.  Echocardiogram showed an EF of 30 to 35%  as well as diastolic dysfunction and further care per cardiology and she will likely undergo a ischemic work-up.  Assessment & Plan:   Active Problems:   SOB (shortness of breath)   Sarcoidosis   Pleural effusion   Elevated troponin   Cardiomegaly   Lymphadenopathy, hilar   Chronic interstitial cystitis   Sinus tachycardia by electrocardiogram   Prolonged QT interval   Multifocal atrial tachycardia (HCC)  SOB (shortness of breath) in the setting of acute combined systolic and diastolic CHF along with moderate mitral regurgitation secondary to mitral valve thickening and valvular disorder -Most likely secondary to fluid overload with pulmonary edema presumptive secondary to he new onset heart failure -BNP on admission was 446.6 -Obtain an echocardiogram and it showed an EF of 30 to 35% with also pseudonormalization and diastolic dysfunction as well as mitral valve dysfunction and regurgitation -Chest x-ray done showed interstitial edema -Cardiology was consulted for further evaluation recommendations and further work-up per them; may need ischemic evaluation as an inpatient due to acuity -High-sensitivity troponin was slightly elevated but flat at 23.0 -Started IV diuresis with Lasix and received IV 40 mg once and will continue with daily -Strict I's and O's, daily weights, fluid restriction of 1500 mL's; -648 mL since admission and has put out 1200 cc today -Currently on a beta-blocker of 12.5 mg Metoprolol every 6 scheduled -Patient was hypokalemic this morning so will replete potassium secondary to IV Lasix -Currently given Atrovent 0.5 mg neb once -Currently not hypoxic or using any supplemental oxygen via nasal  cannula -Cardizem drip has now been stopped secondary to her EF being low Further care per cardiology and will repeat chest x-ray in the a.m. -PT and OT recommending home health PT with a rolling walker and 5 inch wheels  Sarcoidosis -Chronic with mild pulmonary  complications such as nodularity and hilar lymphadenopathy.   -Likely the shortness of breath can be explained by her CHF -If necessary will obtain pulmonary evaluation but likely her shortness of breath is attributed to CHF -Mainly has skin involvement -We will need to discuss with pulmonary eventually  Pleural Effusion  -Mild likely secondary to fluid overload  -will diurese and monitor if there is evidence of underlying pulmonary consolidation will repeat chest x-ray after the patient is diuresed -CTA of the chest showed and a small right greater than left pleural effusion with associated atelectasis or consolidation -Repeat chest x-ray in the a.m.  Elevated Troponin in the setting of fluid overload  -likely demand ischemia we will continue to monitor  -Appreciate cardiology input  -Checked ECHO as beow -Risk Stratify with hemoglobin A1c and lipid panel -Lipid panel showed a total cholesterol/HDL ratio 4.4, cholesterol level 163, HDL 37, LDL of 112, triglycerides of 69, and VLDL 14 and hemoglobin A1c was 5.6 -Reportedly had some intermittent chest pain but none now.  Cardiomegaly -ECHOCardiogram as below and shows that the cavity size is moderately dilated -CT scan showed cardiomegaly and trace pericardial effusion  Lymphadenopathy, hilar  -Most likely secondary to sarcoidosis -CTA showed "Bulky bilateral hilar and mediastinal lymphadenopathy, largest right hilar lymph nodes measuring at least 3.2 x 2.7 cm. By report, patient has known diagnosis of sarcoidosis."   Pulmonary Nodules -Noted on CTA and showed multiple bilateral pulmonary nodules with the largest in the right upper lobe measuring 1.0 centimeters.  These were nonspecific and a repeat follow-up CT as clinically indicated in 3 to 6 months to prove imaging stability  Chronic interstitial cystitis  -Her daughter has a history of trouble voiding -Currently is on Pitocin polysulfate 1 capsule 3 times a day before meals  and will resume -Also taking Uribel which will resume along with Diazepam for Bladder Spasms  Sinus tachycardia / MAT -in the setting of patient ambulating review of EKG showed no evidence of A. fib but there is a lot of atrial ectopy.   -Will monitor patient on telemetry to see if she develops any further worrisome rhythm.   -No longer on Diltiazem due to Concern for worsening CHF -Checked TSH and was   Prolonged QT interval  -Will monitor on Telemetry  -Avoid QT prolonging medications, rehydrate correct electrolytes -Repeat EKG in AM   Normocytic Anemia -Patient's hemoglobin/hematocrit went from 11.3/37.2 is now 11.0/36.3 -Check Anemia Panel in the a.m. -Continue monitor for signs and symptoms of bleeding; currently no overt bleeding noted -Repeat CBC in the a.m.  Hypokalemia -In the setting of IV Lasix -Patient potassium this morning was 2.6 -Replete with p.o. potassium chloride 40 equivalents earlier this morning and then repeated again with 40 mg p.o. twice daily -Continue to monitor and replete as necessary  Nausea -Unclear etiology but will start metoclopramide 5 mg every 8 PRN for nausea vomiting given prolonged QT  Hyperlipidemia -Patient was started on atorvastatin 20 g p.o. nightly  Obesity -Estimated body mass index is 32.32 kg/m as calculated from the following:   Height as of this encounter: 4\' 11"  (1.499 m).   Weight as of this encounter: 72.6 kg. -Weight Loss and Dietary Counseling given  DVT prophylaxis: Enoxaparin 40 mg sq q24h Code Status: FULL CODE Family Communication: Discussed with Daughter over the phone Disposition Plan: Remain Inpatient for further evaluation   Consultants:   Cardiology Dr. Mayford Knifeurner   Procedures:  ECHOCARDIOGRAM IMPRESSIONS    1. The left ventricle has moderate-severely reduced systolic function, with an ejection fraction of 30-35%. The cavity size was moderately dilated. Left ventricular diastolic Doppler parameters  are consistent with pseudonormalization. Elevated mean left  atrial pressure.  2. The right ventricle has normal systolic function. The cavity was normal. There is no increase in right ventricular wall thickness.  3. Left atrial size was severely dilated.  4. The mitral valve is degenerative. Moderate thickening of the mitral valve leaflet. Moderate calcification of the mitral valve leaflet. Mitral valve regurgitation is moderate to severe by color flow Doppler.  5. Restricted posterior leaflet motion.  6. Tricuspid valve regurgitation is moderate.  7. The aortic valve is tricuspid. Moderate thickening of the aortic valve. Moderate calcification of the aortic valve. Aortic valve regurgitation is trivial by color flow Doppler.  8. The aortic root is normal in size and structure.  9. The interatrial septum was not well visualized.  FINDINGS  Left Ventricle: The left ventricle has moderate-severely reduced systolic function, with an ejection fraction of 30-35%. The cavity size was moderately dilated. There is no increase in left ventricular wall thickness. Left ventricular diastolic Doppler  parameters are consistent with pseudonormalization. Elevated mean left atrial pressure  Right Ventricle: The right ventricle has normal systolic function. The cavity was normal. There is no increase in right ventricular wall thickness.  Left Atrium: Left atrial size was severely dilated.  Right Atrium: Right atrial size was normal in size. Right atrial pressure is estimated at 10 mmHg.  Interatrial Septum: The interatrial septum was not well visualized.  Pericardium: There is no evidence of pericardial effusion.  Mitral Valve: The mitral valve is degenerative in appearance. Moderate thickening of the mitral valve leaflet. Moderate calcification of the mitral valve leaflet. Mitral valve regurgitation is moderate to severe by color flow Doppler. Restricted  posterior leaflet motion.  Tricuspid  Valve: The tricuspid valve is normal in structure. Tricuspid valve regurgitation is moderate by color flow Doppler.  Aortic Valve: The aortic valve is tricuspid Moderate thickening of the aortic valve. Moderate calcification of the aortic valve. Aortic valve regurgitation is trivial by color flow Doppler. There is no evidence of aortic valve stenosis.  Pulmonic Valve: The pulmonic valve was normal in structure. Pulmonic valve regurgitation is mild by color flow Doppler.  Aorta: The aortic root is normal in size and structure.    +--------------+--------++  LEFT VENTRICLE                 +----------------+---------++ +--------------+--------++       Diastology                    PLAX 2D                        +----------------+---------++ +--------------+--------++       LV e' lateral:   5.00 cm/s    LVIDd:         5.10 cm         +----------------+---------++ +--------------+--------++       LV E/e' lateral: 23.8         LVIDs:         4.40 cm         +----------------+---------++ +--------------+--------++  LV e' medial:    5.44 cm/s    LV PW:         0.80 cm         +----------------+---------++ +--------------+--------++       LV E/e' medial:  21.9         LV IVS:        0.70 cm         +----------------+---------++ +--------------+--------++  LVOT diam:     2.00 cm    +--------------+--------++  LV SV:         36 ml      +--------------+--------++  LV SV Index:   20.39      +--------------+--------++  LVOT Area:     3.14 cm   +--------------+--------++                            +--------------+--------++   +------------------+---------++  LV Volumes (MOD)               +------------------+---------++  LV area d, A2C:    36.10 cm   +------------------+---------++  LV area d, A4C:    35.70 cm   +------------------+---------++  LV area s, A2C:    30.20 cm   +------------------+---------++  LV area s, A4C:    28.30 cm   +------------------+---------++  LV  major d, A2C:   7.85 cm     +------------------+---------++  LV major d, A4C:   8.03 cm     +------------------+---------++  LV major s, A2C:   7.66 cm     +------------------+---------++  LV major s, A4C:   7.26 cm     +------------------+---------++  LV vol d, MOD A2C: 140.0 ml    +------------------+---------++  LV vol d, MOD A4C: 128.0 ml    +------------------+---------++  LV vol s, MOD A2C: 101.0 ml    +------------------+---------++  LV vol s, MOD A4C: 93.5 ml     +------------------+---------++  LV SV MOD A2C:     39.0 ml     +------------------+---------++  LV SV MOD A4C:     128.0 ml    +------------------+---------++  LV SV MOD BP:      34.6 ml     +------------------+---------++  +---------------+---------++  RIGHT VENTRICLE             +---------------+---------++  RV Basal diam:  3.00 cm     +---------------+---------++  RV Mid diam:    2.00 cm     +---------------+---------++  RV S prime:     9.46 cm/s   +---------------+---------++  TAPSE (M-mode): 1.8 cm      +---------------+---------++  +---------------+--------++-----------++  LEFT ATRIUM               Index         +---------------+--------++-----------++  LA diam:        5.20 cm   3.10 cm/m    +---------------+--------++-----------++  LA Vol (A2C):   93.0 ml   55.44 ml/m   +---------------+--------++-----------++  LA Vol (A4C):   105.0 ml  62.59 ml/m   +---------------+--------++-----------++  LA Biplane Vol: 102.0 ml  60.81 ml/m   +---------------+--------++-----------++  +------------------+-----------++ +--------------+--------++  AORTIC VALVE                      PULMONIC VALVE            +------------------+-----------++ +--------------+--------++  AV Area (Vmax):    2.61 cm  PV Vmax:       1.02 m/s   +------------------+-----------++ +--------------+--------++  AV Area (Vmean):   2.79 cm       PV Peak grad:  4.2 mmHg   +------------------+-----------++  +--------------+--------++  AV Area (VTI):     2.90 cm      +------------------+-----------++  AV Vmax:           129.00 cm/s   +------------------+-----------++  AV Vmean:          88.700 cm/s   +------------------+-----------++  AV VTI:            0.228 m       +------------------+-----------++  AV Peak Grad:      6.7 mmHg      +------------------+-----------++  AV Mean Grad:      3.5 mmHg      +------------------+-----------++  LVOT Vmax:         107.00 cm/s   +------------------+-----------++  LVOT Vmean:        78.800 cm/s   +------------------+-----------++  LVOT VTI:          0.210 m       +------------------+-----------++  LVOT/AV VTI ratio: 0.92          +------------------+-----------++   +-------------+-------++  AORTA                   +-------------+-------++  Ao Root diam: 2.60 cm   +-------------+-------++  Ao Asc diam:  3.00 cm   +-------------+-------++  +--------------+----------++  +---------------+-----------++  MITRAL VALVE                  TRICUSPID VALVE               +--------------+----------++  +---------------+-----------++  MV Area (PHT): 4.49 cm       TR Peak grad:   29.2 mmHg     +--------------+----------++  +---------------+-----------++  MV Peak grad:  7.1 mmHg       TR Vmax:        284.00 cm/s   +--------------+----------++  +---------------+-----------++  MV Mean grad:  3.0 mmHg     +--------------+----------++  +--------------+-------+  MV Vmax:       1.33 m/s       SHUNTS                  +--------------+----------++  +--------------+-------+  MV Vmean:      83.9 cm/s      Systemic VTI:  0.21 m   +--------------+----------++  +--------------+-------+  MV VTI:        0.42 m         Systemic Diam: 2.00 cm  +--------------+----------++  +--------------+-------+  MV PHT:        49.01 msec   +--------------+----------++  MV Decel Time: 169 msec     +--------------+----------++ +--------------+-----------++  MV E velocity: 119.00  cm/s   +--------------+-----------++  MV A velocity: 99.90 cm/s    +--------------+-----------++  MV E/A ratio:  1.19          +--------------+-----------++   Antimicrobials:  Anti-infectives (From admission, onward)   None     Subjective: Seen and examined at bedside and states that she was short of breath.  Denies chest pain, lightheadedness or dizziness.  No nausea or vomiting but was later nauseous after I left the room.  No other concerns or complaints at this time  Objective: Vitals:   03/16/19 1300 03/16/19 1400 03/16/19 1500 03/16/19 1544  BP: 138/63 113/84 124/73  Pulse:    89  Resp: (!) 29 (!) 25 (!) 29   Temp:      TempSrc:      SpO2:      Weight:      Height:        Intake/Output Summary (Last 24 hours) at 03/16/2019 1605 Last data filed at 03/16/2019 1348 Gross per 24 hour  Intake 551.55 ml  Output 1200 ml  Net -648.45 ml   Filed Weights   03/15/19 1011  Weight: 72.6 kg   Examination: Physical Exam:  Constitutional: WN/WD obese AAF in, NAD and appears calm and comfortable Eyes: Lids normal. Has Glaucoma of the Right Eye  ENMT: External Ears, Nose appear normal. Grossly normal hearing. Mucous membranes are moist.  Neck: Appears normal, supple, no cervical masses, normal ROM, no appreciable thyromegaly; mild JVD Respiratory: Diminished to auscultation bilaterally, no wheezing, rales, rhonchi or crackles. Normal respiratory effort and patient is not tachypenic. No accessory muscle use.  Cardiovascular: Mildly Irregular, no murmurs / rubs / gallops. S1 and S2 auscultated. 1+ LE extremity edema.  Abdomen: Soft, non-tender, Distended. No masses palpated. No appreciable hepatosplenomegaly. Bowel sounds positive x4.  GU: Deferred. Musculoskeletal: No clubbing / cyanosis of digits/nails. No joint deformity upper and lower extremities.  Skin: No rashes, ulcers on a limited skin eval but does have anterior tibial lesions characteristic of her sarcoid. No  induration; Warm and dry.  Neurologic: CN 2-12 grossly intact with no focal deficits. Romberg sign and cerebellar reflexes not assessed.  Psychiatric: Normal judgment and insight. Alert and oriented x 3. Mildly anxious mood and appropriate affect.   Data Reviewed: I have personally reviewed following labs and imaging studies  CBC: Recent Labs  Lab 03/15/19 1206 03/16/19 0225  WBC 4.7 4.6  NEUTROABS 3.8  --   HGB 11.3* 11.0*  HCT 37.2 36.3  MCV 92.8 91.7  PLT 276 269   Basic Metabolic Panel: Recent Labs  Lab 03/15/19 1206 03/16/19 0225  NA 143 140  K 3.6 2.6*  CL 106 103  CO2 25 27  GLUCOSE 110* 91  BUN 10 9  CREATININE 0.73 0.70  CALCIUM 8.8* 8.1*  MG  --  1.7  PHOS  --  3.1   GFR: Estimated Creatinine Clearance: 53.6 mL/min (by C-G formula based on SCr of 0.7 mg/dL). Liver Function Tests: Recent Labs  Lab 03/16/19 0225  AST 27  ALT 14  ALKPHOS 112  BILITOT 0.6  PROT 6.1*  ALBUMIN 3.3*   No results for input(s): LIPASE, AMYLASE in the last 168 hours. No results for input(s): AMMONIA in the last 168 hours. Coagulation Profile: No results for input(s): INR, PROTIME in the last 168 hours. Cardiac Enzymes: No results for input(s): CKTOTAL, CKMB, CKMBINDEX, TROPONINI in the last 168 hours. BNP (last 3 results) No results for input(s): PROBNP in the last 8760 hours. HbA1C: Recent Labs    03/16/19 0225  HGBA1C 5.6   CBG: No results for input(s): GLUCAP in the last 168 hours. Lipid Profile: Recent Labs    03/16/19 0225  CHOL 163  HDL 37*  LDLCALC 112*  TRIG 69  CHOLHDL 4.4   Thyroid Function Tests: Recent Labs    03/15/19 2007  TSH 1.344   Anemia Panel: No results for input(s): VITAMINB12, FOLATE, FERRITIN, TIBC, IRON, RETICCTPCT in the last 72 hours. Sepsis Labs: No results for input(s): PROCALCITON, LATICACIDVEN in the last 168 hours.  Recent Results (from the past 240 hour(s))  SARS Coronavirus 2 (CEPHEID -  Performed in [facility]  hospital lab), Hosp Order     Status: None   Collection Time: 03/15/19 12:12 PM   Specimen: Nasopharyngeal Swab  Result Value Ref Range Status   SARS Coronavirus 2 NEGATIVE NEGATIVE Final    Comment: (NOTE) If result is NEGATIVE SARS-CoV-2 target nucleic acids are NOT DETECTED. The SARS-CoV-2 RNA is generally detectable in upper and lower  respiratory specimens during the acute phase of infection. The lowest  concentration of SARS-CoV-2 viral copies this assay can detect is 250  copies / mL. A negative result does not preclude SARS-CoV-2 infection  and should not be used as the sole basis for treatment or other  patient management decisions.  A negative result may occur with  improper specimen collection / handling, submission of specimen other  than nasopharyngeal swab, presence of viral mutation(s) within the  areas targeted by this assay, and inadequate number of viral copies  (<250 copies / mL). A negative result must be combined with clinical  observations, patient history, and epidemiological information. If result is POSITIVE SARS-CoV-2 target nucleic acids are DETECTED. The SARS-CoV-2 RNA is generally detectable in upper and lower  respiratory specimens dur ing the acute phase of infection.  Positive  results are indicative of active infection with SARS-CoV-2.  Clinical  correlation with patient history and other diagnostic information is  necessary to determine patient infection status.  Positive results do  not rule out bacterial infection or co-infection with other viruses. If result is PRESUMPTIVE POSTIVE SARS-CoV-2 nucleic acids MAY BE PRESENT.   A presumptive positive result was obtained on the submitted specimen  and confirmed on repeat testing.  While 2019 novel coronavirus  (SARS-CoV-2) nucleic acids may be present in the submitted sample  additional confirmatory testing may be necessary for epidemiological  and / or clinical management purposes  to differentiate  between  SARS-CoV-2 and other Sarbecovirus currently known to infect humans.  If clinically indicated additional testing with an alternate test  methodology [phone]) is advised. The SARS-CoV-2 RNA is generally  detectable in upper and lower respiratory sp ecimens during the acute  phase of infection. The expected result is Negative. Fact Sheet for Patients:  BoilerBrush.com.cy Fact Sheet for Healthcare Providers: https://pope.com/ This test is not yet approved or cleared by the [place] FDA and has been authorized for detection and/or diagnosis of SARS-CoV-2 by FDA under an Emergency Use Authorization (EUA).  This EUA will remain in effect (meaning this test can be used) for the duration of the COVID-19 declaration under Section 564(b)(1) of the Act, 21 U.S.C. section 360bbb-3(b)(1), unless the authorization is terminated or revoked sooner. Performed at [facility], [address] [place], [place] [address]   MRSA PCR Screening     Status: None   Collection Time: 03/15/19  6:24 PM   Specimen: Nasal Mucosa; Nasopharyngeal  Result Value Ref Range Status   MRSA by PCR NEGATIVE NEGATIVE Final    Comment:        The GeneXpert MRSA Assay (FDA approved for NASAL specimens only), is one component of a comprehensive MRSA colonization surveillance program. It is not intended to diagnose MRSA infection nor to guide or monitor treatment for MRSA infections. Performed at [facility], [address], [place], [place] [address]     Radiology Studies: Ct Angio Chest Pe W/cm &/or Wo Cm  Result Date: 03/15/2019 CLINICAL DATA:  Shortness of breath, cough, lower extremity swelling, rule out PE EXAM: CT ANGIOGRAPHY  CHEST WITH CONTRAST TECHNIQUE: Multidetector CT imaging of the chest was performed using the standard protocol during bolus administration of intravenous contrast. Multiplanar CT image  reconstructions and MIPs were obtained to evaluate the vascular anatomy. CONTRAST:  OMNIPAQUE IOHEXOL 350 MG/ML SOLN COMPARISON:  Same day chest radiograph FINDINGS: Cardiovascular: Satisfactory opacification of the pulmonary arteries to the segmental level. No evidence of pulmonary embolism. Cardiomegaly. Trace pericardial effusion. Mediastinum/Nodes: Bulky bilateral hilar and mediastinal lymphadenopathy, largest right hilar lymph nodes measuring at least 3.2 x 2.7 cm. Enlarged left lobe of the thyroid, poorly imaged due to adjacent dense streak artifact from contrast bolus. Trachea, and esophagus demonstrate no significant findings. Lungs/Pleura: Multiple bilateral pulmonary nodules, largest in the right upper lobe measuring 1.0 cm (series 5, image 33). There is extensive geographic airspace attenuation suggesting small airways disease. Small, right greater than left bilateral pleural effusions. Upper Abdomen: No acute abnormality. Musculoskeletal: No chest wall abnormality. No acute or significant osseous findings. Review of the MIP images confirms the above findings. IMPRESSION: 1.  Negative examination for pulmonary embolism. 2. Bulky bilateral hilar and mediastinal lymphadenopathy, largest right hilar lymph nodes measuring at least 3.2 x 2.7 cm. By report, patient has known diagnosis of sarcoidosis. 3. Small, right greater than left pleural effusions with associated atelectasis or consolidation. 4. Multiple bilateral pulmonary nodules, largest in the right upper lobe measuring 1.0 cm (series 5, image 33). These are nonspecific. Recommend follow-up CT at 3-6 months to evaluate for initial stability if there is no prior imaging available to prove stability. 5. There is extensive geographic airspace attenuation suggesting air trapping and small airways disease. 6.  Cardiomegaly and trace pericardial effusion. Electronically Signed   By: Lauralyn Primes M.D.   On: 03/15/2019 14:13   Dg Chest Port 1  View  Result Date: 03/15/2019 CLINICAL DATA:  One week history of cough and shortness of breath. EXAM: PORTABLE CHEST 1 VIEW COMPARISON:  None. FINDINGS: Borderline cardiac enlargement given the AP projection and portable technique. Prominent mediastinal and hilar contours. Could not exclude adenopathy. Central vascular congestion and increased interstitial markings could suggest CHF. Suspect small bilateral pleural effusions, right greater than left with overlying atelectasis. IMPRESSION: 1. Suspect mild CHF with interstitial edema and small effusions. 2. Findings worrisome for mediastinal and hilar adenopathy. I would recommend a repeat two-view chest film once the patient's symptoms of improved to reassess this. A chest CT with contrast may be necessary. 3. No definite infiltrates. Electronically Signed   By: Rudie Meyer M.D.   On: 03/15/2019 12:20   Scheduled Meds:  atorvastatin  20 mg Oral q1800   Chlorhexidine Gluconate Cloth  6 each Topical Daily   enoxaparin (LOVENOX) injection  40 mg Subcutaneous Q24H   [START ON 03/17/2019] furosemide  40 mg Intravenous Daily   guaiFENesin  600 mg Oral BID   ipratropium  0.5 mg Nebulization Once   metoprolol tartrate  12.5 mg Oral Q6H   potassium chloride  40 mEq Oral BID   sodium chloride flush  3 mL Intravenous Q12H   sodium chloride flush  3 mL Intravenous Q12H   Continuous Infusions:  sodium chloride     sodium chloride      LOS: 0 days   Merlene Laughter, DO [facility] PAGER is on AMION  If 7PM-7AM, please contact night-coverage [email or web address] Password [facility] 03/16/2019, 4:05 PM

## 2019-03-16 NOTE — Evaluation (Signed)
Physical Therapy Evaluation Patient Details Name: Robin Estes MRN: [id number] DOB: 06/15/1946 Today's Date: 03/16/2019   History of Present Illness  73 y.o. female with medical history significant of Osteopenia, Sarcoidosis, Chronic Interstitial Cystitis, R eye blindness - glaucoma and admitted for acute CHF and Multifocal atrial tachycardia  Clinical Impression  Pt admitted with above diagnosis. Pt currently with functional limitations due to the deficits listed below (see PT Problem List).  Pt will benefit from skilled PT to increase their independence and safety with mobility to allow discharge to the venue listed below.  Pt very agreeable to mobilize and requested to use bathroom.  Pt assisted to bathroom and then left in recliner end of session.  Pt reports daughter very helpful and can assist upon d/c.     Follow Up Recommendations Home health PT    Equipment Recommendations  Rolling walker with 5" wheels    Recommendations for Other Services       Precautions / Restrictions Precautions Precautions: Fall Precaution Comments: monitor HR Restrictions Weight Bearing Restrictions: No      Mobility  Bed Mobility Overal bed mobility: Needs Assistance Bed Mobility: Supine to Sit     Supine to sit: Min guard;HOB elevated     General bed mobility comments: min/guard due to lines  Transfers Overall transfer level: Needs assistance Equipment used: Rolling walker (2 wheeled) Transfers: Sit to/from Stand Sit to Stand: Min assist         General transfer comment: assist for stabilizing, cues for hand placement  Ambulation/Gait Ambulation/Gait assistance: Min guard Gait Distance (Feet): 8 Feet(x2) Assistive device: Rolling walker (2 wheeled) Gait Pattern/deviations: Step-through pattern;Decreased stride length     General Gait Details: verbal cues for use of RW, small short steps, ambulated to/from bathroom, HR 102-135 bpm during session  Stairs             Wheelchair Mobility    Modified Rankin (Stroke Patients Only)       Balance Overall balance assessment: Needs assistance         Standing balance support: No upper extremity supported Standing balance-Leahy Scale: Fair Standing balance comment: static fair, dynamic poor                             Pertinent Vitals/Pain Pain Assessment: No/denies pain    Home Living Family/patient expects to be discharged to:: Private residence Living Arrangements: Spouse/significant other Available Help at Discharge: Family Type of Home: House       Home Layout: One level Home Equipment: None      Prior Function Level of Independence: Independent               Hand Dominance        Extremity/Trunk Assessment        Lower Extremity Assessment Lower Extremity Assessment: Generalized weakness       Communication   Communication: No difficulties  Cognition Arousal/Alertness: Awake/alert Behavior During Therapy: WFL for tasks assessed/performed Overall Cognitive Status: Within Functional Limits for tasks assessed                                        General Comments      Exercises     Assessment/Plan    PT Assessment Patient needs continued PT services  PT Problem List Decreased strength;Decreased mobility;Decreased activity tolerance;Decreased balance;Decreased knowledge of  use of DME       PT Treatment Interventions DME instruction;Therapeutic activities;Gait training;Therapeutic exercise;Patient/family education;Stair training;Functional mobility training;Balance training    PT Goals (Current goals can be found in the Care Plan section)  Acute Rehab PT Goals PT Goal Formulation: With patient Time For Goal Achievement: 03/30/19 Potential to Achieve Goals: Good    Frequency Min 3X/week   Barriers to discharge        Co-evaluation PT/OT/SLP Co-Evaluation/Treatment: Yes Reason for Co-Treatment: Complexity of the  patient's impairments (multi-system involvement);For patient/therapist safety PT goals addressed during session: Mobility/safety with mobility OT goals addressed during session: ADL's and self-care       AM-PAC PT "6 Clicks" Mobility  Outcome Measure Help needed turning from your back to your side while in a flat bed without using bedrails?: A Little Help needed moving from lying on your back to sitting on the side of a flat bed without using bedrails?: A Little Help needed moving to and from a bed to a chair (including a wheelchair)?: A Little Help needed standing up from a chair using your arms (e.g., wheelchair or bedside chair)?: A Little Help needed to walk in hospital room?: A Little Help needed climbing 3-5 steps with a railing? : A Lot 6 Click Score: 17    End of Session Equipment Utilized During Treatment: Gait belt Activity Tolerance: Patient tolerated treatment well Patient left: in chair;with call bell/phone within reach   PT Visit Diagnosis: Other abnormalities of gait and mobility (R26.89)    Time: 6378-5885 PT Time Calculation (min) (ACUTE ONLY): 29 min   Charges:   PT Evaluation $PT Eval Low Complexity: 1 Low         Zenovia Jarred, PT, DPT Acute Rehabilitation Services Office: [phone] Pager: [phone]  Sarajane Jews 03/16/2019, 11:33 AM

## 2019-03-16 NOTE — Consult Note (Signed)
Cardiology Consultation:   Patient ID: Robin Estes; [id number]; 06/22/1946   Admit date: 03/15/2019 Date of Consult: 03/16/2019  Primary Care Provider: [facility], [facility] Cardiologist: [facility]; Dr. Mayford Knifeurner Primary Electrophysiologist:  None   Patient Profile:   Robin Estes is a 73 y.o. female with a PMH of interstitial cystitis, sarcoidosis (affecting mostly skin, with lung and eye involvement as well), and osteopenia, who is being seen today for the evaluation of CHF at the request of Dr. Adela Glimpseoutova.  History of Present Illness:   Ms. Robin Estes was in her usual state of health until 2 weeks ago when she began experiencing DOE and LE edema. She also noted an intermittent cough without fevers, chest pain, nausea, vomiting, or diarrhea. Given progression of symptoms, she presented to the ED for further evaluation.   She denies prior cardiac history. She has never undergone an echocardiogram or ischemic evaluation in the past. She reports her father died from an MI at age 74. She has 2 full siblings and 10 half siblings and reports only one half brother with PAD, no other CAD/CHF history.   At the time of this evaluation she reports breathing is stable, though she has not been out of bed much to determine improvement in DOE. She reported some abdominal bloating and orthopnea. She reported her heart racing with exertion. She states she has had intermittent fleeting chest tightness in her lower substernal/epigastric region which occurs randomly, lasts for seconds, and resolves spontaneously. No complaints of PND, dizziness, lightheadedness, or syncope.   Hospital course: intermittently tachycardic to 160s, improved to 80s this morning, tachypneic, and hypertensive, otherwise VSS. Labs notable for K 3.6>2.6, Cr 0.7, Hgb 11.3, PLT 276, BNP 446, Trop 22>20>23>23, LDL 112, Hgb A1C 5.6, TSH 1.3. COVID-19 negative. CXR with interstitial edema c/f mild CHF. CT Chest without  PE but showed hilar lymphadenopathy c/w sarcoidosis diagnosis, multiple pulmonary nodules recommended for surveillance CT in 3-6 months, and cardiomegaly with trace pericardial effusion. EKG with sinus rhythm with frequent ectopy, rate 92, LVH, no STE/D. Repeat EKG with likely multifocal atrial tachycardia with rate 160 bpm, with PVCs.Marland Kitchen. She was started on a diltiazem gtt for rate control with improvement in HR. She was started on IV lasix 40mg  daily for management of CHF. Echo is pending. Cardiology asked to evaluate.   Past Medical History:  Diagnosis Date   Bladder spasms    Chronic interstitial cystitis    Dysuria    End-stage glaucoma    RIGHT EYE   Frequency of urination    Legally blind in right eye, as defined in [place]    SECONDARY TO GLAUCOMA   Nocturia    Sarcoidosis of skin    Sensation of pressure in bladder area    Urgency of urination     Past Surgical History:  Procedure Laterality Date   CATARACT EXTRACTION W/ INTRAOCULAR LENS IMPLANT Left    CYSTOSCOPY WITH HYDRODISTENSION AND BIOPSY N/A 04/21/2013   Procedure: CYSTOSCOPY/BLADDER BIOPSY/HYDRODISTENSION;  Surgeon: Kathi LudwigSigmund I Tannenbaum, MD;  [facility];  Service: Urology;  Laterality: N/A;     Home Medications:  Prior to Admission medications   Medication Sig Start Date End Date Taking? Authorizing Provider  acetaminophen (TYLENOL) 500 MG tablet Take 1,000 mg by mouth every 6 (six) hours as needed for headache.   Yes [provider]  Calcium Glycerophosphate (PRELIEF) 340 (65-50) MG (CA-P) TABS Take 1 capsule by mouth 3 (three) times daily before meals. 04/21/13  Carolan Clines, MD  diazepam (VALIUM) 5 MG tablet Place 5 mg vaginally as directed.    [provider]  hydrOXYzine (ATARAX/VISTARIL) 10 MG tablet Take 1 tablet (10 mg total) by mouth 3 (three) times daily as needed for itching or anxiety (if medication causes sleepiness, may take 30mg  hs.). 04/21/13    Carolan Clines, MD  Meth-Hyo-M Bl-Na Phos-Ph Sal (URIBEL) 118 MG CAPS Take 1 capsule (118 mg total) by mouth 3 times/day as needed-between meals & bedtime. 04/21/13   Carolan Clines, MD  oxyCODONE-acetaminophen (ROXICET) 5-325 MG per tablet Take 1 tablet by mouth every 6 (six) hours as needed for pain. 04/21/13   Carolan Clines, MD  pentosan polysulfate (ELMIRON) 100 MG capsule Take 1 capsule (100 mg total) by mouth 3 (three) times daily before meals. Patient not taking: Reported on 03/15/2019 04/21/13   Carolan Clines, MD    Inpatient Medications: Scheduled Meds:  Chlorhexidine Gluconate Cloth  6 each Topical Daily   enoxaparin (LOVENOX) injection  40 mg Subcutaneous Q24H   [START ON 03/17/2019] furosemide  40 mg Intravenous Daily   guaiFENesin  600 mg Oral BID   ipratropium  0.5 mg Nebulization Once   potassium chloride  40 mEq Oral BID   sodium chloride flush  3 mL Intravenous Q12H   sodium chloride flush  3 mL Intravenous Q12H   Continuous Infusions:  sodium chloride     sodium chloride     diltiazem (CARDIZEM) infusion 5 mg/hr (03/16/19 0332)   PRN Meds: sodium chloride, sodium chloride, acetaminophen, albuterol, sodium chloride flush, sodium chloride flush  Allergies:    Allergies  Allergen Reactions   Cinnamon Other (See Comments)    MOUTH ULCERS    Social History:   Social History   Socioeconomic History   Marital status: Married    Spouse name: Not on file   Number of children: Not on file   Years of education: Not on file   Highest education level: Not on file  Occupational History   Not on file  Social Needs   Financial resource strain: Not on file   Food insecurity    Worry: Not on file    Inability: Not on file   Transportation needs    Medical: Not on file    Non-medical: Not on file  Tobacco Use   Smoking status: Never Smoker   Smokeless tobacco: Never Used  Substance and Sexual Activity   Alcohol use: No    Drug use: No   Sexual activity: Not on file  Lifestyle   Physical activity    Days per week: Not on file    Minutes per session: Not on file   Stress: Not on file  Relationships   Social connections    Talks on phone: Not on file    Gets together: Not on file    Attends religious service: Not on file    Active member of club or organization: Not on file    Attends meetings of clubs or organizations: Not on file    Relationship status: Not on file   Intimate partner violence    Fear of current or ex partner: Not on file    Emotionally abused: Not on file    Physically abused: Not on file    Forced sexual activity: Not on file  Other Topics Concern   Not on file  Social History Narrative   Not on file    Family History:    Family History  Problem  Relation Age of Onset   CAD Father    Sarcoidosis Other      ROS:  Please see the history of present illness.   All other ROS reviewed and negative.     Physical Exam/Data:   Vitals:   03/16/19 0600 03/16/19 0700 03/16/19 0800 03/16/19 0828  BP: 129/72     Pulse: 79 91 83 84  Resp: (!) 26 (!) 24 15 (!) 28  Temp:   98 F (36.7 C)   TempSrc:   Axillary   SpO2: 100% 100% 97% 100%  Weight:      Height:        Intake/Output Summary (Last 24 hours) at 03/16/2019 0834 Last data filed at 03/15/2019 1820 Gross per 24 hour  Intake 15.06 ml  Output --  Net 15.06 ml   Filed Weights   03/15/19 1011  Weight: 72.6 kg   Body mass index is 32.32 kg/m.  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  Neuro:  CNs 2-12 intact, no focal abnormalities noted Psych:  Normal affect   EKG:  The EKG was personally reviewed and demonstrates:  Initial EKG with sinus rhythm with frequent ectopy, rate 92, LVH, no STE/D. Repeat EKG likely multifocal atrial tachycardia with rate 160 bpm, with PVCs. Telemetry:  Telemetry was personally reviewed and demonstrates:  Not convincing of atrial fibrillation or flutter. More consistent with multifocal atrial tachycardia  Relevant CV Studies: Echo pending  Laboratory Data:  Chemistry Recent Labs  Lab 03/15/19 1206 03/16/19 0225  NA 143 140  K 3.6 2.6*  CL 106 103  CO2 25 27  GLUCOSE 110* 91  BUN 10 9  CREATININE 0.73 0.70  CALCIUM 8.8* 8.1*  GFRNONAA >60 >60  GFRAA >60 >60  ANIONGAP 12 10    Recent Labs  Lab 03/16/19 0225  PROT 6.1*  ALBUMIN 3.3*  AST 27  ALT 14  ALKPHOS 112  BILITOT 0.6   Hematology Recent Labs  Lab 03/15/19 1206 03/16/19 0225  WBC 4.7 4.6  RBC 4.01 3.96  HGB 11.3* 11.0*  HCT 37.2 36.3  MCV 92.8 91.7  MCH 28.2 27.8  MCHC 30.4 30.3  RDW 14.1 14.1  PLT 276 269   Cardiac EnzymesNo results for input(s): TROPONINI in the last 168 hours. No results for input(s): TROPIPOC in the last 168 hours.  BNP Recent Labs  Lab 03/15/19 1206  BNP 446.6*    DDimer  Recent Labs  Lab 03/15/19 1206  DDIMER 1.56*    Radiology/Studies:  Ct Angio Chest Pe W/cm &/or Wo Cm  Result Date: 03/15/2019 CLINICAL DATA:  Shortness of breath, cough, lower extremity swelling, rule out PE EXAM: CT ANGIOGRAPHY CHEST WITH CONTRAST TECHNIQUE: Multidetector CT imaging of the chest was performed using the standard protocol during bolus administration of intravenous contrast. Multiplanar CT image reconstructions and MIPs were obtained to evaluate the vascular anatomy. CONTRAST:  100mL  OMNIPAQUE IOHEXOL 350 MG/ML SOLN COMPARISON:  Same day  chest radiograph FINDINGS: Cardiovascular: Satisfactory opacification of the pulmonary arteries to the segmental level. No evidence of pulmonary embolism. Cardiomegaly. Trace pericardial effusion. Mediastinum/Nodes: Bulky bilateral hilar and mediastinal lymphadenopathy, largest right hilar lymph nodes measuring at least 3.2 x 2.7 cm. Enlarged left lobe of the thyroid, poorly imaged due to adjacent dense streak artifact from contrast bolus. Trachea, and esophagus demonstrate no significant findings. Lungs/Pleura: Multiple bilateral pulmonary nodules, largest in the right upper lobe measuring 1.0 cm (series 5, image 33). There is extensive geographic airspace attenuation suggesting small airways disease. Small, right greater than left bilateral pleural effusions. Upper Abdomen: No acute abnormality. Musculoskeletal: No chest wall abnormality. No acute or significant osseous findings. Review of the MIP images confirms the above findings. IMPRESSION: 1.  Negative examination for pulmonary embolism. 2. Bulky bilateral hilar and mediastinal lymphadenopathy, largest right hilar lymph nodes measuring at least 3.2 x 2.7 cm. By report, patient has known diagnosis of sarcoidosis. 3. Small, right greater than left pleural effusions with associated atelectasis or consolidation. 4. Multiple bilateral pulmonary nodules, largest in the right upper lobe measuring 1.0 cm (series 5, image 33). These are nonspecific. Recommend follow-up CT at 3-6 months to evaluate for initial stability if there is no prior imaging available to prove stability. 5. There is extensive geographic airspace attenuation suggesting air trapping and small airways disease. 6.  Cardiomegaly and trace pericardial effusion. Electronically Signed   By: Lauralyn PrimesAlex  Bibbey M.D.   On: 03/15/2019 14:13   Dg Chest Port 1 View  Result Date: 03/15/2019 CLINICAL DATA:  One week history of cough and shortness of breath.  EXAM: PORTABLE CHEST 1 VIEW COMPARISON:  None. FINDINGS: Borderline cardiac enlargement given the AP projection and portable technique. Prominent mediastinal and hilar contours. Could not exclude adenopathy. Central vascular congestion and increased interstitial markings could suggest CHF. Suspect small bilateral pleural effusions, right greater than left with overlying atelectasis. IMPRESSION: 1. Suspect mild CHF with interstitial edema and small effusions. 2. Findings worrisome for mediastinal and hilar adenopathy. I would recommend a repeat two-view chest film once the patient's symptoms of improved to reassess this. A chest CT with contrast may be necessary. 3. No definite infiltrates. Electronically Signed   By: Rudie MeyerP.  Gallerani M.D.   On: 03/15/2019 12:20    Assessment and Plan:   1. Acute CHF: patient presented with DOE and LE edema. BNP elevated to 445. CXR with evidence of interstitial edema. She was started on IV lasix 40mg  daily. I&Os incomplete. Weight 160lbs (noted to be 157lbs at PCP visit 1 mo ago). BP significantly elevated this admission and EKG with evidence of LVH suggesting longstanding issue. She has also had intermittent tachycardia this admission c/f possible paroxysmal atrial flutter. Echo is pending. Lungs are clear on exam but she has 1+ LE edema. - Will follow-up echo results - further work-up/medication recommendations pending assessment of LV function - Continue lasix 40 mg daily - Continue to monitor strict I&Os and daily weights - Continue to monitor electrolytes closely and replete to maintain K >4, Mg >2  2. Multifocal atrial tachycardia: EKG and telemetry c/f intermittent multifocal atrial tachycardia. She was started on a diltiazem gtt with improvement in rate control. TSH wnl. Labs with significant hypokalemia (K2.6) and magnesium 1.7 this AM which may be contributing.  - Can continue diltiazem gtt for now - Aggressive electrolyte repletion to maintain K>4, Mg>2.   3.  Elevated troponin: Trop trend 22>20>23>23. No complaints of chest pain. Here with acute CHF and intermittent tachycardia  to the 160s. Suspect demand ischemia. Risk factors for CAD include newly diagnosed (though suspect longstanding) HTN and HLD.  - Echo pending to assess LV function and wall motion - further ischemic evaluation pending these results  4. Electrolyte imbalance: K 2.6 and Mg 1.7 on AM labs. Repleted with IV K 10 mEq x3 and PO 40 mEq. - Will give an additional K po 40 mEq at this time - Will give IV Mg 4g  - Will check a PM BMET for close monitoring  5. HTN: BP elevated this admission and appears to have been elevated at recent outpatient visit. Not on medications prior to admission.  - Further medication titration pending management of #1 and 2  6. HLD: LDL 112 this admission. No known CAD history. Would benefit from lipid lowering agent for risk management - Will start atorvastatin 20mg  daily  7. Prolonged QT: QTc 543 on EKG this morning. No EKGs prior to this admission for comparison. Not on any QT prolonging medications. Electrolyte imbalance could be contributing - Continue to monitor daily and avoid QT prolonging medications.   8. Sarcoidosis: primarily affects skin. She has known lung involvement without significant pulmonary complaints. Has not been seen outpatient by pulm in several years.  - Continue management per primary team     For questions or updates, please contact [facility] Please consult [email or web address] for contact info under Cardiology/STEMI.   Signed, Beatriz Stallion, PA-C  03/16/2019 8:34 AM [phone]

## 2019-03-16 NOTE — Evaluation (Signed)
Occupational Therapy Evaluation Patient Details Name: Robin Estes MRN: [id number] DOB: [birth year] Today's Date: 03/16/2019    History of Present Illness 73 y.o. female with medical history significant of Osteopenia, Sarcoidosis, Chronic Interstitial Cystitis, R eye blindness - glaucoma and admitted for acute CHF and Multifocal atrial tachycardia   Clinical Impression   Pt was admitted for the above. At baseline, pt is independent. She was unsteady and used RW to get to bathroom.  Also with AFib and increased HR during evaluation. Will follow in acute setting with supervision level goals; she currently needs min A    Follow Up Recommendations  Home health OT    Equipment Recommendations  (tba further; likely none)    Recommendations for Other Services       Precautions / Restrictions Precautions Precautions: Fall Precaution Comments: monitor HR Restrictions Weight Bearing Restrictions: No      Mobility Bed Mobility Overal bed mobility: Needs Assistance Bed Mobility: Supine to Sit     Supine to sit: Min guard;HOB elevated     General bed mobility comments: min/guard due to lines  Transfers Overall transfer level: Needs assistance Equipment used: Rolling walker (2 wheeled) Transfers: Sit to/from Stand Sit to Stand: Min assist         General transfer comment: assist for stabilizing, cues for hand placement    Balance Overall balance assessment: Needs assistance         Standing balance support: No upper extremity supported Standing balance-Leahy Scale: Fair Standing balance comment: static fair, dynamic poor                           ADL either performed or assessed with clinical judgement   ADL Overall ADL's : Needs assistance/impaired Eating/Feeding: Independent   Grooming: Set up   Upper Body Bathing: Set up   Lower Body Bathing: Minimal assistance   Upper Body Dressing : Set up   Lower Body Dressing: Minimal assistance    Toilet Transfer: Minimal assistance;+2 for safety/equipment;Ambulation;Comfort height toilet   Toileting- Clothing Manipulation and Hygiene: Minimal assistance;Sit to/from stand         General ADL Comments: ambulated to bathroom and used commode.  Assisted with donning underwear; assist given for pad.       Vision         Perception     Praxis      Pertinent Vitals/Pain Pain Assessment: No/denies pain     Hand Dominance     Extremity/Trunk Assessment Upper Extremity Assessment Upper Extremity Assessment: Generalized weakness   Lower Extremity Assessment Lower Extremity Assessment: Generalized weakness       Communication Communication Communication: No difficulties   Cognition Arousal/Alertness: Awake/alert Behavior During Therapy: WFL for tasks assessed/performed Overall Cognitive Status: Within Functional Limits for tasks assessed                                     General Comments  AFib with HR up to 135    Exercises     Shoulder Instructions      Home Living Family/patient expects to be discharged to:: Private residence Living Arrangements: Spouse/significant other Available Help at Discharge: Family Type of Home: House       Home Layout: One level         Bathroom Toilet: Handicapped height     Home Equipment: None  Prior Functioning/Environment Level of Independence: Independent                 OT Problem List: Decreased strength;Decreased activity tolerance;Impaired balance (sitting and/or standing);Cardiopulmonary status limiting activity      OT Treatment/Interventions: Self-care/ADL training;DME and/or AE instruction;Patient/family education;Balance training;Therapeutic activities;Therapeutic exercise;energy conservation   OT Goals(Current goals can be found in the care plan section) Acute Rehab OT Goals Patient Stated Goal: return to independence OT Goal Formulation: With patient Time For  Goal Achievement: 03/30/19 Potential to Achieve Goals: Good ADL Goals Pt Will Transfer to Toilet: with modified independence;ambulating Additional ADL Goal #1: pt will complete adl at supervision level including clothing retrieval  OT Frequency: Min 2X/week   Barriers to D/C:            Co-evaluation PT/OT/SLP Co-Evaluation/Treatment: Yes Reason for Co-Treatment: For patient/therapist safety PT goals addressed during session: Mobility/safety with mobility OT goals addressed during session: ADL's and self-care      AM-PAC OT "6 Clicks" Daily Activity     Outcome Measure Help from another person eating meals?: None Help from another person taking care of personal grooming?: A Little Help from another person toileting, which includes using toliet, bedpan, or urinal?: A Little Help from another person bathing (including washing, rinsing, drying)?: A Little Help from another person to put on and taking off regular upper body clothing?: A Little Help from another person to put on and taking off regular lower body clothing?: A Little 6 Click Score: 19   End of Session    Activity Tolerance: Patient tolerated treatment well Patient left: in chair;with call bell/phone within reach  OT Visit Diagnosis: Unsteadiness on feet (R26.81)                Time: 1013-1040 OT Time Calculation (min): 27 min Charges:  OT General Charges $OT Visit: 1 Visit OT Evaluation $OT Eval Low Complexity: 1 Low  Robin Estes, OTR/L Acute Rehabilitation Services [phone] [facility] pager [phone] office 03/16/2019  Robin Estes 03/16/2019, 12:11 PM

## 2019-03-16 NOTE — Progress Notes (Signed)
CRITICAL VALUE ALERT  Critical Value:  K 2.6  Date & Time Notied:  03/16/19 7681  Provider Notified: Georges Mouse MD   Orders Received/Actions taken: See new orders

## 2019-03-17 ENCOUNTER — Inpatient Hospital Stay (HOSPITAL_COMMUNITY): Payer: Medicare Other

## 2019-03-17 ENCOUNTER — Ambulatory Visit (HOSPITAL_COMMUNITY)
Admit: 2019-03-17 | Discharge: 2019-03-17 | Disposition: A | Discharge status: Home / Self Care | Payer: Medicare Other | Attending: Medical | Admitting: Medical

## 2019-03-17 ENCOUNTER — Other Ambulatory Visit: Payer: Self-pay | Admitting: Medical

## 2019-03-17 DIAGNOSIS — I5040 Unspecified combined systolic (congestive) and diastolic (congestive) heart failure: Secondary | ICD-10-CM

## 2019-03-17 DIAGNOSIS — I5020 Unspecified systolic (congestive) heart failure: Secondary | ICD-10-CM

## 2019-03-17 DIAGNOSIS — I509 Heart failure, unspecified: Secondary | ICD-10-CM | POA: Insufficient documentation

## 2019-03-17 DIAGNOSIS — D869 Sarcoidosis, unspecified: Secondary | ICD-10-CM

## 2019-03-17 DIAGNOSIS — I5043 Acute on chronic combined systolic (congestive) and diastolic (congestive) heart failure: Secondary | ICD-10-CM

## 2019-03-17 DIAGNOSIS — I5041 Acute combined systolic (congestive) and diastolic (congestive) heart failure: Secondary | ICD-10-CM

## 2019-03-17 DIAGNOSIS — I5021 Acute systolic (congestive) heart failure: Secondary | ICD-10-CM

## 2019-03-17 LAB — CBC WITH DIFFERENTIAL/PLATELET
Abs Immature Granulocytes: 0.03 10*3/uL (ref 0.00–0.07)
Basophils Absolute: 0 10*3/uL (ref 0.0–0.1)
Basophils Relative: 0 %
Eosinophils Absolute: 0.2 10*3/uL (ref 0.0–0.5)
Eosinophils Relative: 2 %
HCT: 37 % (ref 36.0–46.0)
Hemoglobin: 11 g/dL — ABNORMAL LOW (ref 12.0–15.0)
Immature Granulocytes: 0 %
Lymphocytes Relative: 13 %
Lymphs Abs: 1 10*3/uL (ref 0.7–4.0)
MCH: 28.1 pg (ref 26.0–34.0)
MCHC: 29.7 g/dL — ABNORMAL LOW (ref 30.0–36.0)
MCV: 94.4 fL (ref 80.0–100.0)
Monocytes Absolute: 0.7 10*3/uL (ref 0.1–1.0)
Monocytes Relative: 9 %
Neutro Abs: 5.6 10*3/uL (ref 1.7–7.7)
Neutrophils Relative %: 76 %
Platelets: 310 10*3/uL (ref 150–400)
RBC: 3.92 MIL/uL (ref 3.87–5.11)
RDW: 14.5 % (ref 11.5–15.5)
WBC: 7.5 10*3/uL (ref 4.0–10.5)
nRBC: 0 % (ref 0.0–0.2)

## 2019-03-17 LAB — COMPREHENSIVE METABOLIC PANEL
ALT: 13 U/L (ref 0–44)
AST: 27 U/L (ref 15–41)
Albumin: 3.4 g/dL — ABNORMAL LOW (ref 3.5–5.0)
Alkaline Phosphatase: 108 U/L (ref 38–126)
Anion gap: 9 (ref 5–15)
BUN: 16 mg/dL (ref 8–23)
CO2: 27 mmol/L (ref 22–32)
Calcium: 8.6 mg/dL — ABNORMAL LOW (ref 8.9–10.3)
Chloride: 102 mmol/L (ref 98–111)
Creatinine, Ser: 0.99 mg/dL (ref 0.44–1.00)
GFR calc Af Amer: >60 mL/min (ref >60)
GFR calc non Af Amer: 56 mL/min — ABNORMAL LOW (ref >60)
Glucose, Bld: 124 mg/dL — ABNORMAL HIGH (ref 70–99)
Potassium: 4.7 mmol/L (ref 3.5–5.1)
Sodium: 138 mmol/L (ref 135–145)
Total Bilirubin: 0.7 mg/dL (ref 0.3–1.2)
Total Protein: 6.3 g/dL — ABNORMAL LOW (ref 6.5–8.1)

## 2019-03-17 LAB — NM MYOCAR MULTI W/SPECT W/WALL MOTION / EF
Estimated workload: 1 METS
Exercise duration (min): 0 min
Exercise duration (sec): 0 s
MPHR: 146 {beats}/min
Peak HR: 100 {beats}/min
Percent HR: 68 %
RPE: 0
Rest HR: 82 {beats}/min

## 2019-03-17 LAB — PHOSPHORUS: Phosphorus: 3.4 mg/dL (ref 2.5–4.6)

## 2019-03-17 LAB — MAGNESIUM: Magnesium: 2.7 mg/dL — ABNORMAL HIGH (ref 1.7–2.4)

## 2019-03-17 LAB — ANGIOTENSIN CONVERTING ENZYME: Angiotensin-Converting Enzyme: 63 U/L (ref 14–82)

## 2019-03-17 IMAGING — DX PORTABLE CHEST - 1 VIEW
1 series · 1 of 1 positions shown · non-contrast
Comparison: Radiograph and CT scan [DATE].

CLINICAL DATA: Shortness of breath.

EXAM:
PORTABLE CHEST 1 VIEW

[chest ap]
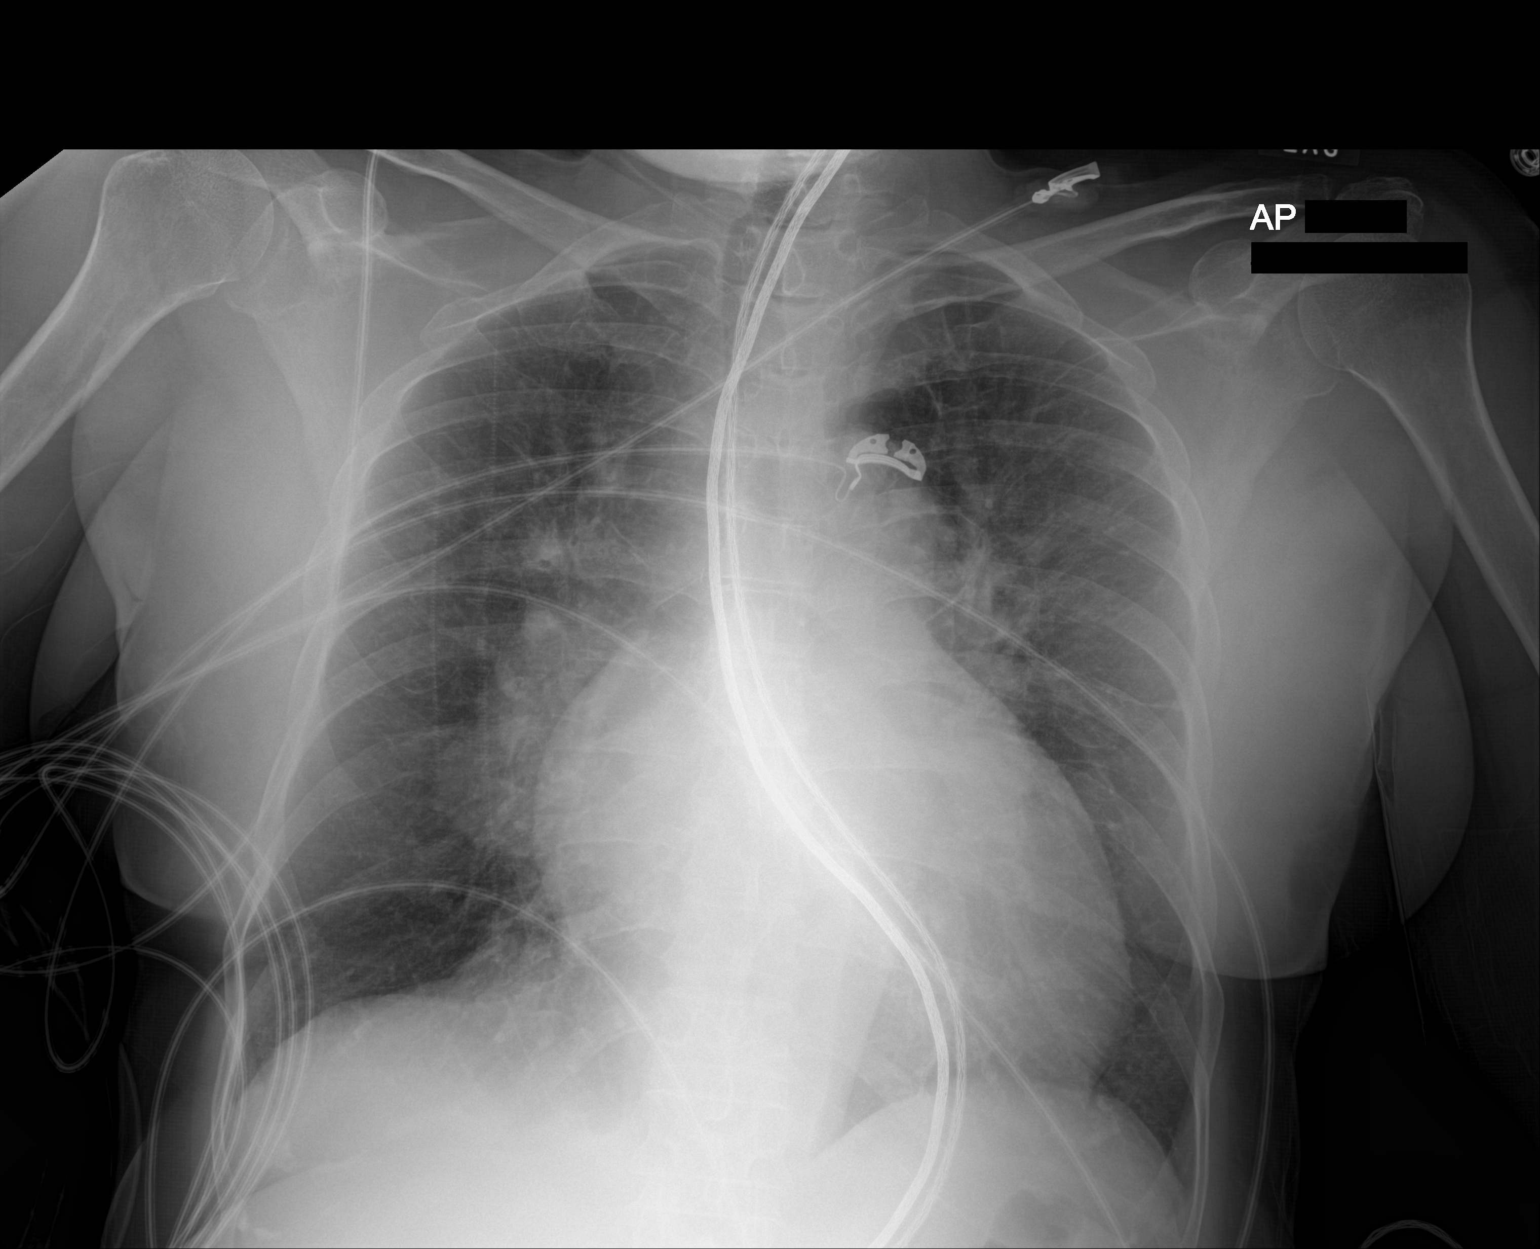

[1 of 1 positions shown; findings below may reference images not displayed]

FINDINGS: Stable cardiomegaly. Stable right hilar adenopathy is noted. No
pneumothorax or pleural effusion is noted. No acute pulmonary
disease is noted. Bony thorax is unremarkable.
IMPRESSION: Stable cardiomegaly and right hilar adenopathy. No significant
pulmonary abnormality is noted.

## 2019-03-17 MED ORDER — SPIRONOLACTONE 12.5 MG HALF TABLET
12.5000 mg | ORAL_TABLET | Freq: Every day | ORAL | Status: DC
  Administered 2019-03-17 – 2019-03-18 (×2): 12.5 mg via ORAL
  Filled 2019-03-17 (×2): qty 1

## 2019-03-17 MED ORDER — SACUBITRIL-VALSARTAN 24-26 MG PO TABS
1.0000 | ORAL_TABLET | Freq: Two times a day (BID) | ORAL | Status: DC
  Administered 2019-03-17 – 2019-03-18 (×2): 1 via ORAL
  Filled 2019-03-17 (×3): qty 1

## 2019-03-17 MED ORDER — TECHNETIUM TC 99M TETROFOSMIN IV KIT
30.0000 | PACK | Freq: Once | INTRAVENOUS | Status: AC | PRN
  Administered 2019-03-17: 30 via INTRAVENOUS

## 2019-03-17 MED ORDER — REGADENOSON 0.4 MG/5ML IV SOLN
0.4000 mg | Freq: Once | INTRAVENOUS | Status: AC
  Administered 2019-03-17: 13:00:00 0.4 mg via INTRAVENOUS

## 2019-03-17 MED ORDER — TECHNETIUM TC 99M TETROFOSMIN IV KIT
10.0000 | PACK | Freq: Once | INTRAVENOUS | Status: AC | PRN
  Administered 2019-03-17: 11:00:00 10 via INTRAVENOUS

## 2019-03-17 MED ORDER — METOPROLOL SUCCINATE ER 25 MG PO TB24
50.0000 mg | ORAL_TABLET | Freq: Every day | ORAL | Status: DC
  Administered 2019-03-17 – 2019-03-18 (×2): 50 mg via ORAL
  Filled 2019-03-17 (×2): qty 2

## 2019-03-17 MED ORDER — REGADENOSON 0.4 MG/5ML IV SOLN
INTRAVENOUS | Status: AC
  Filled 2019-03-17: qty 5

## 2019-03-17 NOTE — Progress Notes (Signed)
Progress Note  Patient Name: Robin Estes Date of Encounter: 03/17/2019  Primary Cardiologist: Armanda Magicraci Turner, MD   Subjective   Called daughter, [name], to discuss cardiac work-up and new diagnosis of acute combined CHF at patients request. Patient reports breathing is back to baseline. No complaints of chest pain or palpitations.   Inpatient Medications    Scheduled Meds:  atorvastatin  20 mg Oral q1800   Chlorhexidine Gluconate Cloth  6 each Topical Daily   enoxaparin (LOVENOX) injection  40 mg Subcutaneous Q24H   furosemide  40 mg Intravenous Daily   guaiFENesin  600 mg Oral BID   ipratropium  0.5 mg Nebulization Once   metoprolol tartrate  12.5 mg Oral Q6H   sodium chloride flush  3 mL Intravenous Q12H   sodium chloride flush  3 mL Intravenous Q12H   Continuous Infusions:  sodium chloride     sodium chloride     PRN Meds: sodium chloride, sodium chloride, acetaminophen, albuterol, hydrOXYzine, metoCLOPramide (REGLAN) injection, sodium chloride flush, sodium chloride flush, Urelle   Vital Signs    Vitals:   03/17/19 0600 03/17/19 0700 03/17/19 0712 03/17/19 0800  BP: 121/72 121/77    Pulse: 74 78    Resp: 18 17    Temp:    97.8 F (36.6 C)  TempSrc:    Oral  SpO2: 99% 100%    Weight:   68.1 kg   Height:        Intake/Output Summary (Last 24 hours) at 03/17/2019 0828 Last data filed at 03/17/2019 0500 Gross per 24 hour  Intake 776.49 ml  Output 1400 ml  Net -623.51 ml   Filed Weights   03/15/19 1011 03/17/19 0712  Weight: 72.6 kg 68.1 kg    Telemetry    Sinus rhythm with occasional PACs - Personally Reviewed  ECG    NSR with rate 75bpm, LVH, early repol, no STE/D, QTc 506 (improved) - Personally Reviewed  Physical Exam   GEN: Sitting upright in bed in no acute distress.   Neck: No JVD, no carotid bruits Cardiac: RRR, no murmurs, rubs, or gallops.  Respiratory: Clear to auscultation bilaterally, no wheezes/ rales/ rhonchi GI:  NABS, Soft, nontender, non-distended  MS: No edema; No deformity. Neuro:  Nonfocal, moving all extremities spontaneously Psych: Normal affect   Labs    Chemistry Recent Labs  Lab 03/16/19 0225 03/16/19 1713 03/17/19 0245  NA 140 139 138  K 2.6* 4.2 4.7  CL 103 102 102  CO2 27 28 27   GLUCOSE 91 125* 124*  BUN 9 10 16   CREATININE 0.70 1.04* 0.99  CALCIUM 8.1* 8.7* 8.6*  PROT 6.1*  --  6.3*  ALBUMIN 3.3*  --  3.4*  AST 27  --  27  ALT 14  --  13  ALKPHOS 112  --  108  BILITOT 0.6  --  0.7  GFRNONAA >60 53* 56*  GFRAA >60 >60 >60  ANIONGAP 10 9 9      Hematology Recent Labs  Lab 03/15/19 1206 03/16/19 0225 03/17/19 0245  WBC 4.7 4.6 7.5  RBC 4.01 3.96 3.92  HGB 11.3* 11.0* 11.0*  HCT 37.2 36.3 37.0  MCV 92.8 91.7 94.4  MCH 28.2 27.8 28.1  MCHC 30.4 30.3 29.7*  RDW 14.1 14.1 14.5  PLT 276 269 310    Cardiac EnzymesNo results for input(s): TROPONINI in the last 168 hours. No results for input(s): TROPIPOC in the last 168 hours.   BNP Recent Labs  Lab 03/15/19  1206  BNP 446.6*     DDimer  Recent Labs  Lab 03/15/19 1206  DDIMER 1.56*     Radiology    Ct Angio Chest Pe W/cm &/or Wo Cm  Result Date: 03/15/2019 CLINICAL DATA:  Shortness of breath, cough, lower extremity swelling, rule out PE EXAM: CT ANGIOGRAPHY CHEST WITH CONTRAST TECHNIQUE: Multidetector CT imaging of the chest was performed using the standard protocol during bolus administration of intravenous contrast. Multiplanar CT image reconstructions and MIPs were obtained to evaluate the vascular anatomy. CONTRAST:  174mL OMNIPAQUE IOHEXOL 350 MG/ML SOLN COMPARISON:  Same day chest radiograph FINDINGS: Cardiovascular: Satisfactory opacification of the pulmonary arteries to the segmental level. No evidence of pulmonary embolism. Cardiomegaly. Trace pericardial effusion. Mediastinum/Nodes: Bulky bilateral hilar and mediastinal lymphadenopathy, largest right hilar lymph nodes measuring at least 3.2 x  2.7 cm. Enlarged left lobe of the thyroid, poorly imaged due to adjacent dense streak artifact from contrast bolus. Trachea, and esophagus demonstrate no significant findings. Lungs/Pleura: Multiple bilateral pulmonary nodules, largest in the right upper lobe measuring 1.0 cm (series 5, image 33). There is extensive geographic airspace attenuation suggesting small airways disease. Small, right greater than left bilateral pleural effusions. Upper Abdomen: No acute abnormality. Musculoskeletal: No chest wall abnormality. No acute or significant osseous findings. Review of the MIP images confirms the above findings. IMPRESSION: 1.  Negative examination for pulmonary embolism. 2. Bulky bilateral hilar and mediastinal lymphadenopathy, largest right hilar lymph nodes measuring at least 3.2 x 2.7 cm. By report, patient has known diagnosis of sarcoidosis. 3. Small, right greater than left pleural effusions with associated atelectasis or consolidation. 4. Multiple bilateral pulmonary nodules, largest in the right upper lobe measuring 1.0 cm (series 5, image 33). These are nonspecific. Recommend follow-up CT at 3-6 months to evaluate for initial stability if there is no prior imaging available to prove stability. 5. There is extensive geographic airspace attenuation suggesting air trapping and small airways disease. 6.  Cardiomegaly and trace pericardial effusion. Electronically Signed   By: Eddie Candle M.D.   On: 03/15/2019 14:13   Dg Chest Port 1 View  Result Date: 03/17/2019 CLINICAL DATA:  Shortness of breath. EXAM: PORTABLE CHEST 1 VIEW COMPARISON:  Radiograph and CT scan of March 15, 2019. FINDINGS: Stable cardiomegaly. Stable right hilar adenopathy is noted. No pneumothorax or pleural effusion is noted. No acute pulmonary disease is noted. Bony thorax is unremarkable. IMPRESSION: Stable cardiomegaly and right hilar adenopathy. No significant pulmonary abnormality is noted. Electronically Signed   By: Marijo Conception M.D.   On: 03/17/2019 07:20   Dg Chest Port 1 View  Result Date: 03/15/2019 CLINICAL DATA:  One week history of cough and shortness of breath. EXAM: PORTABLE CHEST 1 VIEW COMPARISON:  None. FINDINGS: Borderline cardiac enlargement given the AP projection and portable technique. Prominent mediastinal and hilar contours. Could not exclude adenopathy. Central vascular congestion and increased interstitial markings could suggest CHF. Suspect small bilateral pleural effusions, right greater than left with overlying atelectasis. IMPRESSION: 1. Suspect mild CHF with interstitial edema and small effusions. 2. Findings worrisome for mediastinal and hilar adenopathy. I would recommend a repeat two-view chest film once the patient's symptoms of improved to reassess this. A chest CT with contrast may be necessary. 3. No definite infiltrates. Electronically Signed   By: Marijo Sanes M.D.   On: 03/15/2019 12:20    Cardiac Studies   Echocardiogram 03/16/2019: IMPRESSIONS  1. The left ventricle has moderate-severely reduced systolic function, with an ejection  fraction of 30-35%. The cavity size was moderately dilated. Left ventricular diastolic Doppler parameters are consistent with pseudonormalization. Elevated mean left atrial pressure.  2. The right ventricle has normal systolic function. The cavity was normal. There is no increase in right ventricular wall thickness.  3. Left atrial size was severely dilated.  4. The mitral valve is degenerative. Moderate thickening of the mitral valve leaflet. Moderate calcification of the mitral valve leaflet. Mitral valve regurgitation is moderate to severe by color flow Doppler.  5. Restricted posterior leaflet motion.  6. Tricuspid valve regurgitation is moderate.  7. The aortic valve is tricuspid. Moderate thickening of the aortic valve. Moderate calcification of the aortic valve. Aortic valve regurgitation is trivial by color flow Doppler.  8. The aortic root is  normal in size and structure.  9. The interatrial septum was not well visualized.   Patient Profile      73 y.o. female with a PMH of interstitial cystitis, sarcoidosis (affecting mostly skin, with lung and eye involvement as well), and osteopenia, who is being followed by cardiology for the evaluation of CHF   Assessment & Plan    1. Acute combined CHF: patient presented with progressive DOE and LE edema. BNP 445. CXR with interstitial edema. Echo with EF 30-35%, moderately dilated LV, no RWMA, severely dilated LA, and moderate to severe MR.  Started on IV lasix 40mg  daily with - in the past 24 hours. Weight is down from 160lbs on admission to 150lbs today. Possible drop in EF is tachycardia vs HTN mediated, though cannot exclude ischemia. Trops were 20>23>23. - Will plan for NST today to evaluate for ischemia  - Suspect her sarcoidosis could be contributing - will likely plan for outpatient cardiac MRI to assess LV/RV size and LGE - Will consolidate metoprolol to succinate 50mg  daily - Will start low dose entresto with plans to uptitrate to max tolerated dose outpatient - Will stop IV lasix today - favor prn lasix for weight gain/swelling going forward as entresto has a diuretic effect  - Continue to monitor strict I&Os and daily weights - Continue to monitor electrolytes and replete to maintain K>4, Mg >2 - Anticipate repeat echo in 3 months to evaluate for improvement in LV function - if EF remains low, may need to consider ICD for primary prevention.   2. Multifocal atrial tachycardia: HR improved and EKG with NSR this morning. Electrolytes also improved on morning labs. She was initially started on a diltiazem gtt for rate control, however this was transitioned to po metoprolol tartrate after Echo results reviewed. Possible sarcoidosis could be contributing.   - Will consolidate to metoprolol succinate 50mg  daily - Will plan for outpatient cardiac MRI to evaluate for cardiac  involvement.  - Could consider a cardiac event monitor outpatient to determine arrhythmia burden  3. Elevated troponin: Trop trend 22>20>23>23. Some atypical chest pain complaints. Here with acute CHF with echo revealing EF 30-35%. Suspect CHF tachycardia vs HTN mediated, though cannot exclude CAD. - Plan for NST today to evaluate for ischemia  4. MR: noted to have moderate-severe MR on Echo. Likely functional MR in the setting of moderately dilated LV/ severely dilated LA.  - Plan to repeat echo in 3 months after appropriate goal directed HF medication management. Will consider additional work-up if no improvement on repeat echo  5. HTN: BP improved  - Managed in the context of #1  6. Electrolyte imbalance: hypokalemia and hypomagnesemia resolved on labs this morning.    7. HLD:  LDL 112 this admission. Started on atorvastatin 20 mg daily - Plan to repeat FLP/LFTs outpatient in 8 weeks.   8. Prolonged QT: QTc improved to 506 on EKG this morning (543 yesterday). Possibly related to electrolyte imbalance - Continue to monitor routinely and avoid QT prolonging medications  9 Sarcoidosis: primarily affects skin. She has known lung involvement without significant pulmonary complaints. Has not been seen outpatient by pulm in several years. Possible this is contributing to #1 and 2 - Will plan for cardiac MRI outpatient to evaluate RV/LV and LGE.  - May need immunosuppressive therapy   Anticipate follow-up in the advanced heart failure clinic after discharge. Suspect she will be ready for discharge from a cardiac standpoint within the next 24 hours.   For questions or updates, please contact [facility] Please consult [email or web address] for contact info under Cardiology/STEMI.      Signed, Beatriz StallionKrista M. Kalle Bernath, PA-C  03/17/2019, 8:28 AM   [phone]

## 2019-03-17 NOTE — Progress Notes (Signed)
PROGRESS NOTE    Robin Estes  KKX:[id number] DOB: 06/17/1946 DOA: 03/15/2019 PCP: [facility], [facility]   Brief Narrative:  HPI per Dr. Joaquim Nam on 03/15/2019  Robin Estes is a 73 y.o. female with medical history significant of Osteopenia, Sarcoidosis, Chronic Interstitial Cystitis      Presented with   progressive cough worsening over the past week worsening shortness of breath with exertion as well as lower extremity edema which happened more lately And having intermittent cough sometimes clear sometimes productive of yellow sputum no associated fevers no chest pain no nausea vomiting or diarrhea.   She has known history of sarcoidosis mainly affecting her skin but also lungs vulva and eyes. He is supposed to be followed by pulmonology but have not seen him in few years. 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 She has been prescribed steroid creams for her skin involvement of sarcoidosis.  Infectious risk factors:  Reports  shortness of breath  Cough  In  ER RAPID COVID TEST NEGATIVE      Regarding pertinent Chronic problems:  History of chronic interstitial cystitis used to be on [place] Glaucoma of the right eye    While in ER: Patient ambulated noted to have heart rate go up in 130s And as high as 187 EKG was obtained showed A. fib with RVR patient was not aware of having that in the past  Patient was given a dose of Cardizem IV and then started on a drip CTA ordered showed no evidence of pulmonary embolism but showed evidence of pulmonary nodules consistent with prior history of sarcoidosis as well as pleural effusion and pericardial effusion with cardiomegaly Ammonia noted to be slightly elevated but no change no ischemic changes on EKG and no chest pain  **Interim History  She was started on diuresis is been started improving and her shortness of breath is improving.  Echocardiogram showed an EF of 30 to 35%  as well as diastolic dysfunction and further care per cardiology and she will likely undergo a ischemic work-up.  Today she is going for a nuclear medicine stress test at [facility] and cardiology started the patient on metoprolol succinate as well as Entresto.  Diuresis has now been discontinued given that she has been started on Entresto.  Breathing seems back to baseline but further plans of care per cardiology after nuclear medicine stress test.  Assessment & Plan:   Active Problems:   SOB (shortness of breath)   Sarcoidosis   Pleural effusion   Elevated troponin   Cardiomegaly   Lymphadenopathy, hilar   Chronic interstitial cystitis   Sinus tachycardia by electrocardiogram   Prolonged QT interval   Multifocal atrial tachycardia (HCC)  SOB (shortness of breath) in the setting of Acute Combined Systolic and Diastolic CHF along with moderate mitral regurgitation secondary to mitral valve thickening and valvular disorder, improving  -Most likely secondary to fluid overload with pulmonary edema presumptive secondary to he new onset heart failure -BNP on admission was 446.6 -Obtain an echocardiogram and it showed an EF of 30 to 35% with also pseudonormalization and diastolic dysfunction as well as mitral valve dysfunction and regurgitation -Chest x-ray done showed interstitial edema; Repeat CXR this Am showed stable cardiomegaly and Right Hilar Adenopathy with no significant pulmonary disease noted  -Cardiology was consulted for further evaluation recommendations and further work-up per them; may need ischemic evaluation as an inpatient due to acuity -High-sensitivity troponin was slightly elevated but flat at 23.0 -Started  IV diuresis with Lasix and received IV 40 mg once and will continue with daily but stopped today by Cardiology as tjhey are favoring prn Lasix for Weight Gain/Swelling  -Strict I's and O's, daily weights, fluid restriction of 1500 mL's; -608.5 mL since admission -Was on a  beta-blocker of 12.5 mg Metoprolol every 6 scheduled but this was changed to po Metoprolol Succinate 50 mg po Daily  -Patient was hypokalemic yesterday 2/2 to IV Lasix but this has been improved to 47 -Currently given Atrovent 0.5 mg neb once -Currently not hypoxic or using any supplemental oxygen via nasal cannula -Cardizem drip has now been stopped secondary to her EF being low Further care per cardiology  -PT and OT recommending home health PT with a rolling walker and 5 inch wheels -Cardiology also started the patient on Entresto 24-26 mg p.o. twice daily and have also ordered a nuclear medicine stress test for the patient and [facility] this AM to evaluate for Ischemia -Cardiology recommending anticipating repeating her echo in 3 months to evaluate improvement in LV function and if EF remains low we will likely consider an ICD for primary prevention  Sarcoidosis -Chronic with mild pulmonary complications such as nodularity and hilar lymphadenopathy.   -Likely the shortness of breath can be explained by her CHF -If necessary will obtain pulmonary evaluation but likely her shortness of breath is attributed to CHF -Mainly has skin involvement -We will need to discuss with pulmonary eventually -Cardiology is planning a cardiac MRI as an outpatient to evaluate right ventricle and left ventricle and LGE  Pleural Effusion; appears to be improved  -Mild likely secondary to fluid overload  -will diurese and monitor if there is evidence of underlying pulmonary consolidation will repeat chest x-ray after the patient is diuresed -CTA of the chest showed and a small right greater than left pleural effusion with associated atelectasis or consolidation -Repeat chest x-ray had no mention of Pleural Effusion  Elevated Troponin in the setting of fluid overload  -likely demand ischemia we will continue to monitor  -Appreciate cardiology input  -Checked ECHO as beow -Risk Stratify  with hemoglobin A1c and lipid panel -Lipid panel showed a total cholesterol/HDL ratio 4.4, cholesterol level 163, HDL 37, LDL of 112, triglycerides of 69, and VLDL 14 and hemoglobin A1c was 5.6 -Reportedly had some intermittent chest pain but none now. -Patient going for nuclear medicine Stress [facility]   Cardiomegaly -ECHOCardiogram as below and shows that the cavity size is moderately dilated -CT scan showed cardiomegaly and trace pericardial effusion  Lymphadenopathy, hilar  -Most likely secondary to sarcoidosis -CTA showed "Bulky bilateral hilar and mediastinal lymphadenopathy, largest right hilar lymph nodes measuring at least 3.2 x 2.7 cm. By report, patient has known diagnosis of sarcoidosis."  Pulmonary Nodules -Noted on CTA and showed multiple bilateral pulmonary nodules with the largest in the right upper lobe measuring 1.0 centimeters.  These were nonspecific and a repeat follow-up CT as clinically indicated in 3 to 6 months to prove imaging stability  Chronic Interstitial Cystitis  -Her daughter has a history of trouble voiding -Currently is on Pitocin polysulfate 1 capsule 3 times a day before meals and will resume -Also taking Uribel which will resume along with Diazepam for Bladder Spasms  Sinus tachycardia / MAT -in the setting of patient ambulating review of EKG showed no evidence of A. fib but there is a lot of atrial ectopy.   -Will monitor patient on telemetry  to see if she develops any further worrisome rhythm.   -No longer on Diltiazem due to Concern for worsening CHF -Checked TSH and was 1.344 -Cardiology planning on outpatient cardiac MRI 10 diet for cardiac involvement and considering a cardiac event monitor as outpatient to determine arrhythmia burden  Prolonged QT interval  -Will monitor on Telemetry  -Avoid QT prolonging medications, rehydrate correct electrolytes -Repeat EKG this AM showing qTc of 506  Normocytic Anemia -Patient's  hemoglobin/hematocrit went from 11.3/37.2 is now 11.0/37.0 -Check Anemia Panel in the a.m. -Continue monitor for signs and symptoms of bleeding; currently no overt bleeding noted -Repeat CBC in the a.m.  Hypokalemia, improved  -In the setting of IV Lasix -Patient potassium yesterady morning was 2.6; Now improved and is 4.7 -Replete with p.o. potassium chloride 40 equivalents earlier this morning and then repeated again with 40 mg p.o. twice daily -Continue to monitor and replete as necessary  Nausea -Unclear etiology but will start metoclopramide 5 mg every 8 PRN for nausea vomiting given prolonged QT  Hyperlipidemia -Patient was started on atorvastatin 20 g p.o. nightly  Obesity -Estimated body mass index is 30.32 kg/m as calculated from the following:   Height as of this encounter:  (1.499 m).   Weight as of this encounter: 68.1 kg. -Weight Loss and Dietary Counseling given   Mitral Regurgitation -Noted to be moderate to severe on echo--cardiology planning on repeating echo in 3 months after goal-directed heart failure medications are in place and will consider outpatient work-up if no improvement on repeat echo in 3 months  Hyperglycemia -HbA1c was checked and was 5.6 -Continue to monitor blood sugars carefully and if necessary will need to place the patient on a sensitive NovoLog size, insulin before meals  DVT prophylaxis: Enoxaparin 40 mg sq q24h Code Status: FULL CODE Family Communication: Discussed with Daughter over the phone Disposition Plan: Remain Inpatient for further evaluation and she undergoing stress Testing at [facility]  Consultants:   Cardiology Dr. Mayford Knife   Procedures:  ECHOCARDIOGRAM IMPRESSIONS    1. The left ventricle has moderate-severely reduced systolic function, with an ejection fraction of 30-35%. The cavity size was moderately dilated. Left ventricular diastolic Doppler parameters are consistent with pseudonormalization.  Elevated mean left  atrial pressure.  2. The right ventricle has normal systolic function. The cavity was normal. There is no increase in right ventricular wall thickness.  3. Left atrial size was severely dilated.  4. The mitral valve is degenerative. Moderate thickening of the mitral valve leaflet. Moderate calcification of the mitral valve leaflet. Mitral valve regurgitation is moderate to severe by color flow Doppler.  5. Restricted posterior leaflet motion.  6. Tricuspid valve regurgitation is moderate.  7. The aortic valve is tricuspid. Moderate thickening of the aortic valve. Moderate calcification of the aortic valve. Aortic valve regurgitation is trivial by color flow Doppler.  8. The aortic root is normal in size and structure.  9. The interatrial septum was not well visualized.  FINDINGS  Left Ventricle: The left ventricle has moderate-severely reduced systolic function, with an ejection fraction of 30-35%. The cavity size was moderately dilated. There is no increase in left ventricular wall thickness. Left ventricular diastolic Doppler  parameters are consistent with pseudonormalization. Elevated mean left atrial pressure  Right Ventricle: The right ventricle has normal systolic function. The cavity was normal. There is no increase in right ventricular wall thickness.  Left Atrium: Left atrial size was severely dilated.  Right Atrium: Right atrial size was  normal in size. Right atrial pressure is estimated at 10 mmHg.  Interatrial Septum: The interatrial septum was not well visualized.  Pericardium: There is no evidence of pericardial effusion.  Mitral Valve: The mitral valve is degenerative in appearance. Moderate thickening of the mitral valve leaflet. Moderate calcification of the mitral valve leaflet. Mitral valve regurgitation is moderate to severe by color flow Doppler. Restricted  posterior leaflet motion.  Tricuspid Valve: The tricuspid valve is normal in  structure. Tricuspid valve regurgitation is moderate by color flow Doppler.  Aortic Valve: The aortic valve is tricuspid Moderate thickening of the aortic valve. Moderate calcification of the aortic valve. Aortic valve regurgitation is trivial by color flow Doppler. There is no evidence of aortic valve stenosis.  Pulmonic Valve: The pulmonic valve was normal in structure. Pulmonic valve regurgitation is mild by color flow Doppler.  Aorta: The aortic root is normal in size and structure.    +--------------+--------++  LEFT VENTRICLE                 +----------------+---------++ +--------------+--------++       Diastology                    PLAX 2D                        +----------------+---------++ +--------------+--------++       LV e' lateral:   5.00 cm/s    LVIDd:         5.10 cm         +----------------+---------++ +--------------+--------++       LV E/e' lateral: 23.8         LVIDs:         4.40 cm         +----------------+---------++ +--------------+--------++       LV e' medial:    5.44 cm/s    LV PW:         0.80 cm         +----------------+---------++ +--------------+--------++       LV E/e' medial:  21.9         LV IVS:        0.70 cm         +----------------+---------++ +--------------+--------++  LVOT diam:     2.00 cm    +--------------+--------++  LV SV:         36 ml      +--------------+--------++  LV SV Index:   20.39      +--------------+--------++  LVOT Area:     3.14 cm   +--------------+--------++                            +--------------+--------++   +------------------+---------++  LV Volumes (MOD)               +------------------+---------++  LV area d, A2C:    36.10 cm   +------------------+---------++  LV area d, A4C:    35.70 cm   +------------------+---------++  LV area s, A2C:    30.20 cm   +------------------+---------++  LV area s, A4C:    28.30 cm   +------------------+---------++  LV major d, A2C:   7.85 cm      +------------------+---------++  LV major d, A4C:   8.03 cm     +------------------+---------++  LV major s, A2C:   7.66 cm     +------------------+---------++  LV major s, A4C:   7.26  cm     +------------------+---------++  LV vol d, MOD A2C: 140.0 ml    +------------------+---------++  LV vol d, MOD A4C: 128.0 ml    +------------------+---------++  LV vol s, MOD A2C: 101.0 ml    +------------------+---------++  LV vol s, MOD A4C: 93.5 ml     +------------------+---------++  LV SV MOD A2C:     39.0 ml     +------------------+---------++  LV SV MOD A4C:     128.0 ml    +------------------+---------++  LV SV MOD BP:      34.6 ml     +------------------+---------++  +---------------+---------++  RIGHT VENTRICLE             +---------------+---------++  RV Basal diam:  3.00 cm     +---------------+---------++  RV Mid diam:    2.00 cm     +---------------+---------++  RV S prime:     9.46 cm/s   +---------------+---------++  TAPSE (M-mode): 1.8 cm      +---------------+---------++  +---------------+--------++-----------++  LEFT ATRIUM               Index         +---------------+--------++-----------++  LA diam:        5.20 cm   3.10 cm/m    +---------------+--------++-----------++  LA Vol (A2C):   93.0 ml   55.44 ml/m   +---------------+--------++-----------++  LA Vol (A4C):   105.0 ml  62.59 ml/m   +---------------+--------++-----------++  LA Biplane Vol: 102.0 ml  60.81 ml/m   +---------------+--------++-----------++  +------------------+-----------++ +--------------+--------++  AORTIC VALVE                      PULMONIC VALVE            +------------------+-----------++ +--------------+--------++  AV Area (Vmax):    2.61 cm       PV Vmax:       1.02 m/s   +------------------+-----------++ +--------------+--------++  AV Area (Vmean):   2.79 cm       PV Peak grad:  4.2 mmHg   +------------------+-----------++ +--------------+--------++  AV Area (VTI):      2.90 cm      +------------------+-----------++  AV Vmax:           129.00 cm/s   +------------------+-----------++  AV Vmean:          88.700 cm/s   +------------------+-----------++  AV VTI:            0.228 m       +------------------+-----------++  AV Peak Grad:      6.7 mmHg      +------------------+-----------++  AV Mean Grad:      3.5 mmHg      +------------------+-----------++  LVOT Vmax:         107.00 cm/s   +------------------+-----------++  LVOT Vmean:        78.800 cm/s   +------------------+-----------++  LVOT VTI:          0.210 m       +------------------+-----------++  LVOT/AV VTI ratio: 0.92          +------------------+-----------++   +-------------+-------++  AORTA                   +-------------+-------++  Ao Root diam: 2.60 cm   +-------------+-------++  Ao Asc diam:  3.00 cm   +-------------+-------++  +--------------+----------++  +---------------+-----------++  MITRAL VALVE                  TRICUSPID  VALVE               +--------------+----------++  +---------------+-----------++  MV Area (PHT): 4.49 cm       TR Peak grad:   29.2 mmHg     +--------------+----------++  +---------------+-----------++  MV Peak grad:  7.1 mmHg       TR Vmax:        284.00 cm/s   +--------------+----------++  +---------------+-----------++  MV Mean grad:  3.0 mmHg     +--------------+----------++  +--------------+-------+  MV Vmax:       1.33 m/s       SHUNTS                  +--------------+----------++  +--------------+-------+  MV Vmean:      83.9 cm/s      Systemic VTI:  0.21 m   +--------------+----------++  +--------------+-------+  MV VTI:        0.42 m         Systemic Diam: 2.00 cm  +--------------+----------++  +--------------+-------+  MV PHT:        49.01 msec   +--------------+----------++  MV Decel Time: 169 msec     +--------------+----------++ +--------------+-----------++  MV E velocity: 119.00 cm/s   +--------------+-----------++  MV A  velocity: 99.90 cm/s    +--------------+-----------++  MV E/A ratio:  1.19          +--------------+-----------++   Antimicrobials:  Anti-infectives (From admission, onward)   None     Subjective: Seen and examined at bedside this AM and she states that she is a little short winded after trying to rush to get ready.  No nausea or vomiting.  States that breathing is improved and does not really feel like she is much more swelling left.  No chest pain currently.  No lightheadedness or dizziness and understand that she will be on going for a nuclear medicine stress test later today.  No other concerns or complaints at this time.  Objective: Vitals:   03/17/19 0712 03/17/19 0800 03/17/19 0900 03/17/19 1000  BP:   (!) 141/66 131/80  Pulse:   88 74  Resp:   16 (!) 24  Temp:  97.8 F (36.6 C)    TempSrc:  Oral    SpO2:   95% 98%  Weight: 68.1 kg     Height:        Intake/Output Summary (Last 24 hours) at 03/17/2019 1228 Last data filed at 03/17/2019 0500 Gross per 24 hour  Intake 536.49 ml  Output 200 ml  Net 336.49 ml   Filed Weights   03/15/19 1011 03/17/19 0712  Weight: 72.6 kg 68.1 kg   Examination: Physical Exam:  Constitutional: Well-nourished, well-developed obese African-American female currently in no acute distress but is a little short winded after try to get ready quickly.  She appears calm but slightly uncomfortable. Eyes: Lids are normal.  Has glaucoma of the right eye. ENMT: External ears and nose appear normal.  Grossly normal hearing. Neck: Appears supple no JVD Respiratory: Diminished auscultation bilaterally no appreciable wheezing, rales rhonchi.  Patient not tachypneic wheezing accessory muscle breathe Cardiovascular: Regular rate and rhythm.  Slight murmur heard.  Mild lower extremity edema Abdomen: Soft, nontender, distended secondary body habitus.  Bowel sounds present GU: Deferred Musculoskeletal: No contractures or cyanosis.  No joint deformities in  the upper lower extremities noted Skin: No appreciable rashes or lesions on to skin evaluation but does have anterior tibial lesions characteristic of sarcoid as well as  on her forehead.  Skin is warm and dry Neurologic: Cranial nerves II through XII gross intact no appreciable focal deficits.  Romberg sign cerebellar functions were assessed Psychiatric: Pleasant mood and affect.  Intact judgment insight appears slightly anxious  Data Reviewed: I have personally reviewed following labs and imaging studies  CBC: Recent Labs  Lab 03/15/19 1206 03/16/19 0225 03/17/19 0245  WBC 4.7 4.6 7.5  NEUTROABS 3.8  --  5.6  HGB 11.3* 11.0* 11.0*  HCT 37.2 36.3 37.0  MCV 92.8 91.7 94.4  PLT 276 269 310   Basic Metabolic Panel: Recent Labs  Lab 03/15/19 1206 03/16/19 0225 03/16/19 1713 03/17/19 0245  NA 143 140 139 138  K 3.6 2.6* 4.2 4.7  CL 106 103 102 102  CO2 25 27 28 27   GLUCOSE 110* 91 125* 124*  BUN 10 9 10 16   CREATININE 0.73 0.70 1.04* 0.99  CALCIUM 8.8* 8.1* 8.7* 8.6*  MG  --  1.7  --  2.7*  PHOS  --  3.1  --  3.4   GFR: Estimated Creatinine Clearance: 41.9 mL/min (by C-G formula based on SCr of 0.99 mg/dL). Liver Function Tests: Recent Labs  Lab 03/16/19 0225 03/17/19 0245  AST 27 27  ALT 14 13  ALKPHOS 112 108  BILITOT 0.6 0.7  PROT 6.1* 6.3*  ALBUMIN 3.3* 3.4*   No results for input(s): LIPASE, AMYLASE in the last 168 hours. No results for input(s): AMMONIA in the last 168 hours. Coagulation Profile: No results for input(s): INR, PROTIME in the last 168 hours. Cardiac Enzymes: No results for input(s): CKTOTAL, CKMB, CKMBINDEX, TROPONINI in the last 168 hours. BNP (last 3 results) No results for input(s): PROBNP in the last 8760 hours. HbA1C: Recent Labs    03/16/19 0225  HGBA1C 5.6   CBG: No results for input(s): GLUCAP in the last 168 hours. Lipid Profile: Recent Labs    03/16/19 0225  CHOL 163  HDL 37*  LDLCALC 112*  TRIG 69  CHOLHDL 4.4    Thyroid Function Tests: Recent Labs    03/15/19 2007  TSH 1.344   Anemia Panel: No results for input(s): VITAMINB12, FOLATE, FERRITIN, TIBC, IRON, RETICCTPCT in the last 72 hours. Sepsis Labs: No results for input(s): PROCALCITON, LATICACIDVEN in the last 168 hours.  Recent Results (from the past 240 hour(s))  SARS Coronavirus 2 (CEPHEID - Performed in [facility] hospital lab), Hosp Order     Status: None   Collection Time: 03/15/19 12:12 PM   Specimen: Nasopharyngeal Swab  Result Value Ref Range Status   SARS Coronavirus 2 NEGATIVE NEGATIVE Final    Comment: (NOTE) If result is NEGATIVE SARS-CoV-2 target nucleic acids are NOT DETECTED. The SARS-CoV-2 RNA is generally detectable in upper and lower  respiratory specimens during the acute phase of infection. The lowest  concentration of SARS-CoV-2 viral copies this assay can detect is 250  copies / mL. A negative result does not preclude SARS-CoV-2 infection  and should not be used as the sole basis for treatment or other  patient management decisions.  A negative result may occur with  improper specimen collection / handling, submission of specimen other  than nasopharyngeal swab, presence of viral mutation(s) within the  areas targeted by this assay, and inadequate number of viral copies  (<250 copies / mL). A negative result must be combined with clinical  observations, patient history, and epidemiological information. If result is POSITIVE SARS-CoV-2 target nucleic acids are DETECTED. The SARS-CoV-2 RNA is  generally detectable in upper and lower  respiratory specimens dur ing the acute phase of infection.  Positive  results are indicative of active infection with SARS-CoV-2.  Clinical  correlation with patient history and other diagnostic information is  necessary to determine patient infection status.  Positive results do  not rule out bacterial infection or co-infection with other viruses. If result is PRESUMPTIVE  POSTIVE SARS-CoV-2 nucleic acids MAY BE PRESENT.   A presumptive positive result was obtained on the submitted specimen  and confirmed on repeat testing.  While 2019 novel coronavirus  (SARS-CoV-2) nucleic acids may be present in the submitted sample  additional confirmatory testing may be necessary for epidemiological  and / or clinical management purposes  to differentiate between  SARS-CoV-2 and other Sarbecovirus currently known to infect humans.  If clinically indicated additional testing with an alternate test  methodology [phone]([id number]) is advised. The SARS-CoV-2 RNA is generally  detectable in upper and lower respiratory sp ecimens during the acute  phase of infection. The expected result is Negative. Fact Sheet for Patients:  BoilerBrush.com.cyhttps://www.fda.gov/media/136312/download Fact Sheet for Healthcare Providers: https://pope.com/https://www.fda.gov/media/136313/download This test is not yet approved or cleared by the [place] FDA and has been authorized for detection and/or diagnosis of SARS-CoV-2 by FDA under an Emergency Use Authorization (EUA).  This EUA will remain in effect (meaning this test can be used) for the duration of the COVID-19 declaration under Section 564(b)(1) of the Act, 21 U.S.C. section 360bbb-3(b)(1), unless the authorization is terminated or revoked sooner. Performed at [facility], [address] [place], [place] [address]   MRSA PCR Screening     Status: None   Collection Time: 03/15/19  6:24 PM   Specimen: Nasal Mucosa; Nasopharyngeal  Result Value Ref Range Status   MRSA by PCR NEGATIVE NEGATIVE Final    Comment:        The GeneXpert MRSA Assay (FDA approved for NASAL specimens only), is one component of a comprehensive MRSA colonization surveillance program. It is not intended to diagnose MRSA infection nor to guide or monitor treatment for MRSA infections. Performed at [facility], [address] [place], [place] [address]       Radiology Studies: Ct Angio Chest Pe W/cm &/or Wo Cm  Result Date: 03/15/2019 CLINICAL DATA:  Shortness of breath, cough, lower extremity swelling, rule out PE EXAM: CT ANGIOGRAPHY CHEST WITH CONTRAST TECHNIQUE: Multidetector CT imaging of the chest was performed using the standard protocol during bolus administration of intravenous contrast. Multiplanar CT image reconstructions and MIPs were obtained to evaluate the vascular anatomy. CONTRAST:  100mL OMNIPAQUE IOHEXOL 350 MG/ML SOLN COMPARISON:  Same day chest radiograph FINDINGS: Cardiovascular: Satisfactory opacification of the pulmonary arteries to the segmental level. No evidence of pulmonary embolism. Cardiomegaly. Trace pericardial effusion. Mediastinum/Nodes: Bulky bilateral hilar and mediastinal lymphadenopathy, largest right hilar lymph nodes measuring at least 3.2 x 2.7 cm. Enlarged left lobe of the thyroid, poorly imaged due to adjacent dense streak artifact from contrast bolus. Trachea, and esophagus demonstrate no significant findings. Lungs/Pleura: Multiple bilateral pulmonary nodules, largest in the right upper lobe measuring 1.0 cm (series 5, image 33). There is extensive geographic airspace attenuation suggesting small airways disease. Small, right greater than left bilateral pleural effusions. Upper Abdomen: No acute abnormality. Musculoskeletal: No chest wall abnormality. No acute or significant osseous findings. Review of the MIP images confirms the above findings. IMPRESSION: 1.  Negative examination for pulmonary embolism. 2. Bulky bilateral hilar and mediastinal lymphadenopathy, largest right  hilar lymph nodes measuring at least 3.2 x 2.7 cm. By report, patient has known diagnosis of sarcoidosis. 3. Small, right greater than left pleural effusions with associated atelectasis or consolidation. 4. Multiple bilateral pulmonary nodules, largest in the right upper lobe measuring 1.0 cm (series 5, image 33). These are nonspecific. Recommend  follow-up CT at 3-6 months to evaluate for initial stability if there is no prior imaging available to prove stability. 5. There is extensive geographic airspace attenuation suggesting air trapping and small airways disease. 6.  Cardiomegaly and trace pericardial effusion. Electronically Signed   By: Lauralyn Primes M.D.   On: 03/15/2019 14:13   Dg Chest Port 1 View  Result Date: 03/17/2019 CLINICAL DATA:  Shortness of breath. EXAM: PORTABLE CHEST 1 VIEW COMPARISON:  Radiograph and CT scan of March 15, 2019. FINDINGS: Stable cardiomegaly. Stable right hilar adenopathy is noted. No pneumothorax or pleural effusion is noted. No acute pulmonary disease is noted. Bony thorax is unremarkable. IMPRESSION: Stable cardiomegaly and right hilar adenopathy. No significant pulmonary abnormality is noted. Electronically Signed   By: Lupita Raider M.D.   On: 03/17/2019 07:20   Scheduled Meds:  atorvastatin  20 mg Oral q1800   Chlorhexidine Gluconate Cloth  6 each Topical Daily   enoxaparin (LOVENOX) injection  40 mg Subcutaneous Q24H   guaiFENesin  600 mg Oral BID   ipratropium  0.5 mg Nebulization Once   metoprolol succinate  50 mg Oral Daily   sacubitril-valsartan  1 tablet Oral BID   sodium chloride flush  3 mL Intravenous Q12H   sodium chloride flush  3 mL Intravenous Q12H   Continuous Infusions:  sodium chloride     sodium chloride      LOS: 1 day   Merlene Laughter, DO [facility] PAGER is on AMION  If 7PM-7AM, please contact night-coverage [email or web address] Password [facility] 03/17/2019, 12:28 PM

## 2019-03-17 NOTE — Progress Notes (Signed)
Pt tolerated procedure well. She complained of some mild nausea and stomach pain. At the end of the procedure she has no complaints, vss.

## 2019-03-17 NOTE — Discharge Instructions (Signed)
A cardiac MRI has been ordered to further evaluation for evidence of sarcoidosis affecting your heart. Someone from [facility] radiology department will contact you to schedule this test.   Heart Failure Education: 1. Weigh yourself EVERY morning after you go to the bathroom but before you eat or drink anything. Write this number down in a weight log/diary. If you gain 3 pounds overnight or 5 pounds in a week, call the office. 2. Take your medicines as prescribed. If you have concerns about your medications, please call us before you stop taking them.  3. Eat low salt foods--Limit salt (sodium) to 2000 mg per day. This will help prevent your body from holding onto fluid. Read food labels as many processed foods have a lot of sodium, especially canned goods and prepackaged meats. If you would like some assistance choosing low sodium foods, we would be happy to set you up with a nutritionist. 4. Stay as active as you can everyday. Staying active will give you more energy and make your muscles stronger. Start with 5 minutes at a time and work your way up to 30 minutes a day. Break up your activities--do some in the morning and some in the afternoon. Start with 3 days per week and work your way up to 5 days as you can.  If you have chest pain, feel short of breath, dizzy, or lightheaded, STOP. If you don't feel better after a short rest, call 911. If you do feel better, call the office to let us know you have symptoms with exercise. 5. Limit all fluids for the day to less than 2 liters. Fluid includes all drinks, coffee, juice, ice chips, soup, jello, and all other liquids.

## 2019-03-17 NOTE — Progress Notes (Signed)
lexiscan myoview completed stress portion without complications.  Results are pending.

## 2019-03-17 NOTE — Progress Notes (Signed)
OT Cancellation Note  Patient Details Name: Robin Estes MRN: [id number] DOB: August 06, 1946   Cancelled Treatment:    Reason Eval/Treat Not Completed: Patient at procedure or test/ unavailable  Maytal Mijangos 03/17/2019, 2:29 PM  Lesle Chris, OTR/L Acute Rehabilitation Services [phone] [facility] pager [phone] office 03/17/2019

## 2019-03-18 ENCOUNTER — Inpatient Hospital Stay (HOSPITAL_COMMUNITY): Payer: Medicare Other

## 2019-03-18 LAB — CBC WITH DIFFERENTIAL/PLATELET
Abs Immature Granulocytes: 0.03 10*3/uL (ref 0.00–0.07)
Basophils Absolute: 0 10*3/uL (ref 0.0–0.1)
Basophils Relative: 0 %
Eosinophils Absolute: 0.2 10*3/uL (ref 0.0–0.5)
Eosinophils Relative: 4 %
HCT: 37.4 % (ref 36.0–46.0)
Hemoglobin: 11.1 g/dL — ABNORMAL LOW (ref 12.0–15.0)
Immature Granulocytes: 1 %
Lymphocytes Relative: 16 %
Lymphs Abs: 0.8 10*3/uL (ref 0.7–4.0)
MCH: 27.9 pg (ref 26.0–34.0)
MCHC: 29.7 g/dL — ABNORMAL LOW (ref 30.0–36.0)
MCV: 94 fL (ref 80.0–100.0)
Monocytes Absolute: 0.5 10*3/uL (ref 0.1–1.0)
Monocytes Relative: 9 %
Neutro Abs: 3.6 10*3/uL (ref 1.7–7.7)
Neutrophils Relative %: 70 %
Platelets: 302 10*3/uL (ref 150–400)
RBC: 3.98 MIL/uL (ref 3.87–5.11)
RDW: 14.4 % (ref 11.5–15.5)
WBC: 5.1 10*3/uL (ref 4.0–10.5)
nRBC: 0 % (ref 0.0–0.2)

## 2019-03-18 LAB — COMPREHENSIVE METABOLIC PANEL
ALT: 13 U/L (ref 0–44)
AST: 25 U/L (ref 15–41)
Albumin: 3.2 g/dL — ABNORMAL LOW (ref 3.5–5.0)
Alkaline Phosphatase: 109 U/L (ref 38–126)
Anion gap: 6 (ref 5–15)
BUN: 13 mg/dL (ref 8–23)
CO2: 28 mmol/L (ref 22–32)
Calcium: 8.6 mg/dL — ABNORMAL LOW (ref 8.9–10.3)
Chloride: 104 mmol/L (ref 98–111)
Creatinine, Ser: 0.77 mg/dL (ref 0.44–1.00)
GFR calc Af Amer: >60 mL/min (ref >60)
GFR calc non Af Amer: >60 mL/min (ref >60)
Glucose, Bld: 114 mg/dL — ABNORMAL HIGH (ref 70–99)
Potassium: 4.6 mmol/L (ref 3.5–5.1)
Sodium: 138 mmol/L (ref 135–145)
Total Bilirubin: 0.6 mg/dL (ref 0.3–1.2)
Total Protein: 6 g/dL — ABNORMAL LOW (ref 6.5–8.1)

## 2019-03-18 LAB — PHOSPHORUS: Phosphorus: 4.1 mg/dL (ref 2.5–4.6)

## 2019-03-18 LAB — MAGNESIUM: Magnesium: 2.1 mg/dL (ref 1.7–2.4)

## 2019-03-18 IMAGING — DX PORTABLE CHEST - 1 VIEW
1 series · 1 of 1 positions shown · non-contrast
Comparison: Chest x-rays dated [DATE] and [DATE]. Chest CT
dated [DATE].

CLINICAL DATA: Shortness of breath.

EXAM:
PORTABLE CHEST 1 VIEW

[chest ap]
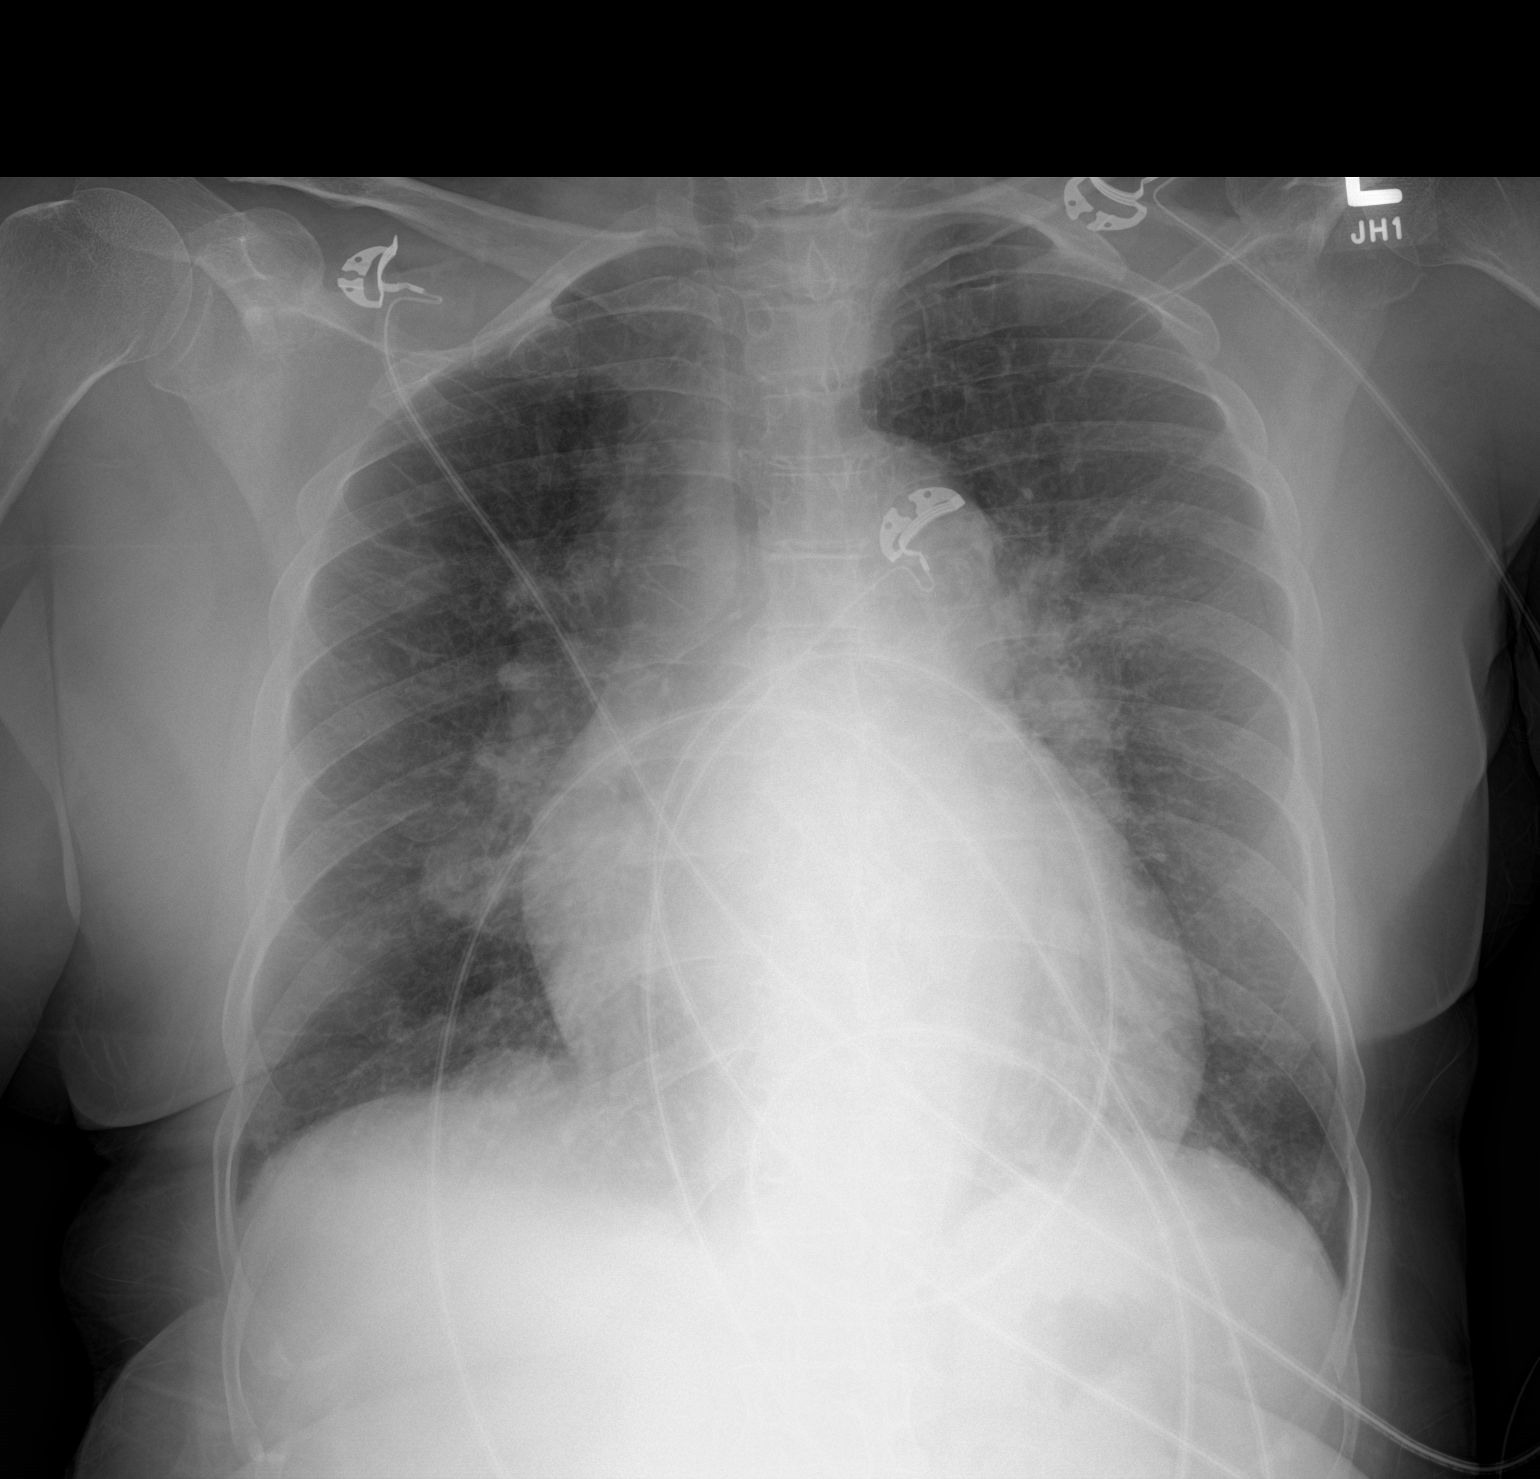

[1 of 1 positions shown; findings below may reference images not displayed]

FINDINGS: Stable cardiomegaly. Overall cardiomediastinal silhouette is stable.
Prominent perihilar shadows are stable, compatible with the bulky
perihilar and mediastinal lymphadenopathy demonstrated on earlier
chest CT angiogram, compatible with sarcoidosis by report.
Superimposed mild central pulmonary vascular congestion without
overt alveolar pulmonary edema. No confluent opacity to suggest a
developing pneumonia. No pleural effusion or pneumothorax seen.
Osseous structures about the chest are unremarkable.
IMPRESSION: 1. Cardiomegaly with mild central pulmonary vascular congestion
suggesting mild CHF/volume overload. No overt alveolar pulmonary
edema. No evidence of pneumonia.
2. Bulky perihilar and mediastinal lymphadenopathy better
demonstrated on earlier chest CTs, compatible with sarcoidosis by
report.

## 2019-03-18 MED ORDER — FUROSEMIDE 20 MG PO TABS
20.0000 mg | ORAL_TABLET | Freq: Every day | ORAL | Status: DC
  Administered 2019-03-18: 10:00:00 20 mg via ORAL
  Filled 2019-03-18: qty 1

## 2019-03-18 MED ORDER — GUAIFENESIN ER 600 MG PO TB12: 600.0000 mg | ORAL_TABLET | Freq: Two times a day (BID) | ORAL | 0 refills | Status: DC

## 2019-03-18 MED ORDER — SPIRONOLACTONE 25 MG PO TABS: 12.5000 mg | ORAL_TABLET | Freq: Every day | ORAL | 0 refills | Status: DC

## 2019-03-18 MED ORDER — FUROSEMIDE 20 MG PO TABS: 20.0000 mg | ORAL_TABLET | Freq: Every day | ORAL | 0 refills | Status: DC

## 2019-03-18 MED ORDER — ATORVASTATIN CALCIUM 20 MG PO TABS: 20.0000 mg | ORAL_TABLET | Freq: Every day | ORAL | 0 refills | Status: DC

## 2019-03-18 MED ORDER — SACUBITRIL-VALSARTAN 24-26 MG PO TABS: 1.0000 | ORAL_TABLET | Freq: Two times a day (BID) | ORAL | 0 refills | Status: DC

## 2019-03-18 MED ORDER — METOPROLOL SUCCINATE ER 50 MG PO TB24: 50.0000 mg | ORAL_TABLET | Freq: Every day | ORAL | 0 refills | Status: DC

## 2019-03-18 NOTE — Progress Notes (Signed)
SATURATION QUALIFICATIONS: (This note is used to comply with regulatory documentation for home oxygen)  Patient Saturations on Room Air at Rest = 96%  Patient Saturations on Room Air while Ambulating = 98%   Please briefly explain why patient needs home oxygen:

## 2019-03-18 NOTE — TOC Initial Note (Signed)
Transition of Care [facility]) - Initial/Assessment Note    Patient Details  Name: Robin Estes MRN: [id number] Date of Birth: 03/08/1946  Transition of Care [facility]) CM/SW Contact:    Joaquin Courts, RN Phone Number: 03/18/2019, 1:56 PM  Clinical Narrative:   CM spoke with patient at bedside who declines home health services, states has plenty of help at home and can get around fine on her own. CM explained rational for [facility] services and benefits to patient, who remains adamant that she does not want these services.                 Expected Discharge Plan: [place] Barriers to Discharge: No Barriers Identified   Patient Goals and CMS Choice Patient states their goals for this hospitalization and ongoing recovery are:: to go home      Expected Discharge Plan and Services Expected Discharge Plan: [place]   Discharge Planning Services: CM Consult Post Acute Care Choice: [place] arrangements for the past 2 months: Single Family Home Expected Discharge Date: 03/18/19               DME Arranged: N/A DME Agency: NA       HH Arranged: Patient Refused HH          Prior Living Arrangements/Services Living arrangements for the past 2 months: Single Family Home Lives with:: Spouse Patient language and need for interpreter reviewed:: Yes Do you feel safe going back to the place where you live?: Yes      Need for Family Participation in Patient Care: Yes (Comment) Care giver support system in place?: Yes (comment)   Criminal Activity/Legal Involvement Pertinent to Current Situation/Hospitalization: No - Comment as needed  Activities of Daily Living Home Assistive Devices/Equipment: Eyeglasses ADL Screening (condition at time of admission) Patient's cognitive ability adequate to safely complete daily activities?: Yes Is the patient deaf or have difficulty hearing?: No Does the patient have difficulty seeing, even when wearing  glasses/contacts?: No Does the patient have difficulty concentrating, remembering, or making decisions?: No Patient able to express need for assistance with ADLs?: Yes Does the patient have difficulty dressing or bathing?: No Independently performs ADLs?: Yes (appropriate for developmental age) Does the patient have difficulty walking or climbing stairs?: No Weakness of Legs: None Weakness of Arms/Hands: None  Permission Sought/Granted                  Emotional Assessment Appearance:: Appears stated age Attitude/Demeanor/Rapport: Engaged Affect (typically observed): Accepting Orientation: : Oriented to Place, Oriented to  Time, Oriented to Situation, Oriented to Self   Psych Involvement: No (comment)  Admission diagnosis:  Dyspnea on exertion [R06.09] SOB (shortness of breath) [R06.02] Patient Active Problem List   Diagnosis Date Noted  . Acute combined systolic and diastolic heart failure ([place])   . Multifocal atrial tachycardia ([facility])   . SOB (shortness of breath) 03/15/2019  . Sarcoidosis 03/15/2019  . Pleural effusion 03/15/2019  . Elevated troponin 03/15/2019  . Cardiomegaly 03/15/2019  . Lymphadenopathy, hilar 03/15/2019  . Chronic interstitial cystitis 03/15/2019  . Sinus tachycardia by electrocardiogram 03/15/2019  . Prolonged QT interval 03/15/2019   PCP:  [facility] [place]:   [place], [place] [address] Phone: [phone] Fax: [phone]     Social Determinants of Health (SDOH) Interventions    Readmission Risk Interventions No flowsheet data  found.  

## 2019-03-18 NOTE — Care Management Important Message (Signed)
Important Message  Patient Details  Name: Robin Estes MRN: [id number] Date of Birth: 08-06-1946   Medicare Important Message Given:  N/A - LOS <3 / Initial given by admissions     Joaquin Courts, RN 03/18/2019, 12:47 PM

## 2019-03-18 NOTE — Progress Notes (Signed)
Progress Note  Patient Name: Robin RompMargaret Dettore Date of Encounter: 03/18/2019  Primary Cardiologist: Armanda Magicraci Turner, MD   Subjective   Ready to go home   Inpatient Medications    Scheduled Meds: . atorvastatin  20 mg Oral q1800  . Chlorhexidine Gluconate Cloth  6 each Topical Daily  . enoxaparin (LOVENOX) injection  40 mg Subcutaneous Q24H  . furosemide  20 mg Oral Daily  . guaiFENesin  600 mg Oral BID  . ipratropium  0.5 mg Nebulization Once  . metoprolol succinate  50 mg Oral Daily  . sacubitril-valsartan  1 tablet Oral BID  . sodium chloride flush  3 mL Intravenous Q12H  . spironolactone  12.5 mg Oral Daily   Continuous Infusions: . sodium chloride    . sodium chloride     PRN Meds: sodium chloride, sodium chloride, acetaminophen, albuterol, hydrOXYzine, metoCLOPramide (REGLAN) injection, sodium chloride flush, sodium chloride flush, [place]   Vital Signs    Vitals:   03/18/19 0700 03/18/19 0746 03/18/19 0800 03/18/19 0957  BP: (!) 147/73  130/76 111/68  Pulse: 75  79 74  Resp: (!) 30  (!) 37 13  Temp:  98.2 F (36.8 C)    TempSrc:  Oral    SpO2: 96%  97% 98%  Weight:      Height:       No intake or output data in the 24 hours ending 03/18/19 1022 Filed Weights   03/15/19 1011 03/17/19 0712 03/18/19 0500  Weight: 72.6 kg 68.1 kg 67.7 kg    Telemetry    Sinus rhythm with occasional PACs - Personally Reviewed  ECG    NSR with rate 75bpm, LVH, early repol, no STE/D, QTc 506 (improved) - Personally Reviewed  Physical Exam   Affect appropriate Black female  HEENT: sarcoid skin changes on forehead  Neck supple with no adenopathy JVP normal no bruits no thyromegaly Lungs clear with no wheezing and good diaphragmatic motion Heart:  S1/S2 MR murmur, no rub, gallop or click PMI normal Abdomen: benighn, BS positve, no tenderness, no AAA no bruit.  No HSM or HJR Distal pulses intact with no bruits No edema Neuro non-focal Skin warm and dry No muscular  weakness   Labs    Chemistry Recent Labs  Lab 03/16/19 0225 03/16/19 1713 03/17/19 0245 03/18/19 0343  NA 140 139 138 138  K 2.6* 4.2 4.7 4.6  CL 103 102 102 104  CO2 27 28 27 28   GLUCOSE 91 125* 124* 114*  BUN 9 10 16 13   CREATININE 0.70 1.04* 0.99 0.77  CALCIUM 8.1* 8.7* 8.6* 8.6*  PROT 6.1*  --  6.3* 6.0*  ALBUMIN 3.3*  --  3.4* 3.2*  AST 27  --  27 25  ALT 14  --  13 13  ALKPHOS 112  --  108 109  BILITOT 0.6  --  0.7 0.6  GFRNONAA >60 53* 56* >60  GFRAA >60 >60 >60 >60  ANIONGAP 10 9 9 6      Hematology Recent Labs  Lab 03/16/19 0225 03/17/19 0245 03/18/19 0343  WBC 4.6 7.5 5.1  RBC 3.96 3.92 3.98  HGB 11.0* 11.0* 11.1*  HCT 36.3 37.0 37.4  MCV 91.7 94.4 94.0  MCH 27.8 28.1 27.9  MCHC 30.3 29.7* 29.7*  RDW 14.1 14.5 14.4  PLT 269 310 302    Cardiac EnzymesNo results for input(s): TROPONINI in the last 168 hours. No results for input(s): TROPIPOC in the last 168 hours.   BNP Recent  Labs  Lab 03/15/19 1206  BNP 446.6*     DDimer  Recent Labs  Lab 03/15/19 1206  DDIMER 1.56*     Radiology    Nm Myocar Multi W/spect W/wall Motion / Ef  Result Date: 03/17/2019  There was no ST segment deviation noted during stress.  No T wave inversion was noted during stress.  The left ventricular ejection fraction is moderately decreased (30-44%).  Nuclear stress EF: 30%.  The perfusion study is normal.  This is an intermediate risk study due to LV dysfunction.  Normal myocardial perfusion with no ischemia noted. Moderate LV dysfunction with EF 30%. Intermediate risk study due to LV dysfunction.    Dg Chest Port 1 View  Result Date: 03/18/2019 CLINICAL DATA:  Shortness of breath. EXAM: PORTABLE CHEST 1 VIEW COMPARISON:  Chest x-rays dated 03/17/2019 and 03/15/2019. Chest CT dated 03/15/2019. FINDINGS: Stable cardiomegaly. Overall cardiomediastinal silhouette is stable. Prominent perihilar shadows are stable, compatible with the bulky perihilar and  mediastinal lymphadenopathy demonstrated on earlier chest CT angiogram, compatible with sarcoidosis by report. Superimposed mild central pulmonary vascular congestion without overt alveolar pulmonary edema. No confluent opacity to suggest a developing pneumonia. No pleural effusion or pneumothorax seen. Osseous structures about the chest are unremarkable. IMPRESSION: 1. Cardiomegaly with mild central pulmonary vascular congestion suggesting mild CHF/volume overload. No overt alveolar pulmonary edema. No evidence of pneumonia. 2. Bulky perihilar and mediastinal lymphadenopathy better demonstrated on earlier chest CTs, compatible with sarcoidosis by report. Electronically Signed   By: Bary Richard M.D.   On: 03/18/2019 07:07   Dg Chest Port 1 View  Result Date: 03/17/2019 CLINICAL DATA:  Shortness of breath. EXAM: PORTABLE CHEST 1 VIEW COMPARISON:  Radiograph and CT scan of March 15, 2019. FINDINGS: Stable cardiomegaly. Stable right hilar adenopathy is noted. No pneumothorax or pleural effusion is noted. No acute pulmonary disease is noted. Bony thorax is unremarkable. IMPRESSION: Stable cardiomegaly and right hilar adenopathy. No significant pulmonary abnormality is noted. Electronically Signed   By: Lupita Raider M.D.   On: 03/17/2019 07:20    Cardiac Studies   Echocardiogram 03/16/2019: IMPRESSIONS  1. The left ventricle has moderate-severely reduced systolic function, with an ejection fraction of 30-35%. The cavity size was moderately dilated. Left ventricular diastolic Doppler parameters are consistent with pseudonormalization. Elevated mean left atrial pressure.  2. The right ventricle has normal systolic function. The cavity was normal. There is no increase in right ventricular wall thickness.  3. Left atrial size was severely dilated.  4. The mitral valve is degenerative. Moderate thickening of the mitral valve leaflet. Moderate calcification of the mitral valve leaflet. Mitral valve  regurgitation is moderate to severe by color flow Doppler.  5. Restricted posterior leaflet motion.  6. Tricuspid valve regurgitation is moderate.  7. The aortic valve is tricuspid. Moderate thickening of the aortic valve. Moderate calcification of the aortic valve. Aortic valve regurgitation is trivial by color flow Doppler.  8. The aortic root is normal in size and structure.  9. The interatrial septum was not well visualized.   Patient Profile      73 y.o. female with a PMH of interstitial cystitis, sarcoidosis (affecting mostly skin, with lung and eye involvement as well), and osteopenia, who is being followed by cardiology for the evaluation of CHF   Assessment & Plan    1. Acute combined CHF: patient presented with progressive DOE and LE edema. BNP 445. CXR with interstitial edema. Echo with EF 30-35%, moderately dilated LV, no  RWMA, severely dilated LA, and moderate to severe MR Improved myovue with normal perfusion EF 30% continue entresto d/c with lasix 20 mg daily will arrange f/u with Dr Radford Pax Titrate entresto and CHF meds will need f/u for MRI and echo in 3 months for MR and sarcoid  2. Multifocal atrial tachycardia: HR improved and EKG with NSR this morning. Electrolytes also improved on morning labs. She was initially started on a diltiazem gtt for rate control, however this was transitioned to po metoprolol tartrate after Echo results reviewed. Possible sarcoidosis could be contributing.    3. Elevated troponin: Trop trend 22>20>23>23. Some atypical chest pain complaints. Here with acute CHF with echo revealing EF 30-35%. Suspect CHF tachycardia vs HTN mediated, though cannot exclude CAD.Myovue with normal perfusion yesterday   4. MR: noted to have moderate-severe MR on Echo. Likely functional MR in the setting of moderately dilated LV/ severely dilated LA.  - Plan to repeat echo in 3 months after appropriate goal directed HF medication management. Will consider additional  work-up if no improvement on repeat echo  5. HTN: BP improved  - Managed in the context of #1  6. Electrolyte imbalance: hypokalemia and hypomagnesemia resolved on labs this morning.    7. HLD: LDL 112 this admission. Started on atorvastatin 20 mg daily - Plan to repeat FLP/LFTs outpatient in 8 weeks.   8. Prolonged QT: QTc improved to 506 on EKG this morning (543 yesterday). Possibly related to electrolyte imbalance - Continue to monitor routinely and avoid QT prolonging medications  9 Sarcoidosis: primarily affects skin. She has known lung involvement without significant pulmonary complaints. Has not been seen outpatient by pulm in several years. Possible this is contributing to #1 and 2 - Will plan for cardiac MRI outpatient to evaluate RV/LV and LGE.  - May need immunosuppressive therapy   D/c home today lasix 20 mg in addition to entresto and beta blocker will arrange f/u with Dr Radford Pax  For questions or updates, please contact [facility] Please consult [email or web address] for contact info under Cardiology/STEMI.      Signed, Jenkins Rouge, MD  03/18/2019, 10:22 AM   [phone]

## 2019-03-18 NOTE — Discharge Summary (Signed)
Physician Discharge Summary  Robin Estes ZOX:[id number] DOB: 1946-01-19 DOA: 03/15/2019  PCP: [facility]  Admit date: 03/15/2019 Discharge date: 03/18/2019  Admitted From: Home Disposition: Home  Recommendations for Outpatient Follow-up:  1. Follow up with PCP in 1-2 weeks 2. Follow up with Cardiology and Heart Failure Team 3. Please obtain CMP/CBC, Mag, Phos in one week 4. Repeat CXR in 3-6 months  5. Repeat chest CT for pulmonary nodules in 3 to 6 months to ensure stability 6. Please follow up on the following pending results:  Home Health: No  Equipment/Devices: None  Discharge Condition: Stable  CODE STATUS: FULL CODE  Diet recommendation: Heart Health Diet with Fluid Restriction 1500 mL   Brief/Interim Summary: HPI per Dr. Karen Kays on 03/15/2019  Honor Junes a 73 y.o.femalewith medical history significant of Osteopenia, Sarcoidosis, Chronic Interstitial Cystitis    Presented withprogressive cough worsening over the past week worsening shortness of breath with exertion as well as lower extremity edema which happened more lately And having intermittent cough sometimes clear sometimes productive of yellow sputum no associated fevers no chest pain no nausea vomiting or diarrhea.   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 He is supposed to be followed by pulmonology but have not seen him in few years. 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 She has been prescribed steroid creams for her skin involvement of sarcoidosis.  Infectious risk factors: Reports shortness of breath Cough  In ER RAPID COVID TEST NEGATIVE    Regarding pertinent Chronic problems: History of chronic interstitial cystitis used to be on Elmiron Glaucoma of the right eye   While in ER: Patient ambulated noted to have heart rate go up in 130s And as high as 187 EKG  was obtained showed A. fib with RVR patient was not aware of having that in the past  Patient was given a dose of Cardizem IV and then started on a drip CTA ordered showed no evidence of pulmonary embolism but showed evidence of pulmonary nodules consistent with prior history of sarcoidosis as well as pleural effusion and pericardial effusion with cardiomegaly Ammonia noted to be slightly elevated but no change no ischemic changes on EKG and no chest pain  **Interim History  She was started on diuresis is been started improving and her shortness of breath is improving.  Echocardiogram showed an EF of 30 to 35% as well as diastolic dysfunction and further care per cardiology and she will likely undergo a ischemic work-up.  Today she is going for a nuclear medicine stress test at [facility] and cardiology started the patient on metoprolol succinate as well as Entresto.  Diuresis has now been discontinued given that she has been started on Entresto.  Breathing seems back to baseline but further plans of care per cardiology after nuclear medicine stress test.  Nuclear stress test was done and was read as intermediate risk but this was because of her low EF.  Cardiology evaluated and deemed the patient stable for discharge.  Prior to discharge she ambulated and did not desaturate.  He has close follow-up with the advanced heart failure clinic in 5 days and will be sent out on Entresto, metoprolol, furosemide, and spironolactone as well as atorvastatin.  Discharge Diagnoses:  Active Problems:   SOB (shortness of breath)   Sarcoidosis   Pleural effusion   Elevated troponin   Cardiomegaly   Lymphadenopathy, hilar   Chronic interstitial cystitis  Sinus tachycardia by electrocardiogram   Prolonged QT interval   Multifocal atrial tachycardia (HCC)   Acute combined systolic and diastolic heart failure (HCC)  SOB(shortness of breath) in the setting of Acute Combined Systolic and Diastolic CHF along  with moderate mitral regurgitation secondary to mitral valve thickening and valvular disorder, improving  -Most likely secondary to fluid overload with pulmonary edema presumptive secondary to he new onset heart failure -BNP on admission was 446.6 -Obtain an echocardiogram and it showed an EF of 30 to 35% with also pseudonormalization and diastolic dysfunction as well as mitral valve dysfunction and regurgitation -Chest x-ray done showed interstitial edema; Repeat CXR this Am showed stable cardiomegaly and Right Hilar Adenopathy with no significant pulmonary disease noted  -Cardiology was consulted for further evaluation recommendations and further work-up per them; may need ischemic evaluation as an inpatient due to acuity -High-sensitivity troponin was slightly elevated but flat at 23.0 -Started IV diuresis with Lasix and received IV 40 mg once and will continue with daily but stopped yesterday by Cardiology as they were favoring prn Lasix for Weight Gain/Swelling; cardiology then instead started 20 Lasix p.o. daily along with spironolactone 12.5 mg p.o. daily -Strict I's and O's, daily weights, fluid restriction of 1500 mL's; -368.5 mL since admission -Was on a beta-blocker of 12.5 mg Metoprolol every 6 scheduled but this was changed to po Metoprolol Succinate 50 mg po Daily  -Patient was hypokalemic yesterday 2/2 to IV Lasix but this has been improved to 4.6 -Currently given Atrovent 0.5 mg neb once -Currently not hypoxic or using any supplemental oxygen via nasal cannula -Cardizem drip has now been stopped secondary to her EF being low Further care per cardiology  -PT and OT recommending home health PT with a rolling walker and 5 inch wheels -Cardiology also started the patient on Entresto 24-26 mg p.o. twice daily and have also ordered a nuclear medicine stress test for the patient and [facility] to evaluate for Ischemia -Nuclear medicine stress test was read as  intermediate but likely secondary to her low EF; there was normal perfusion on her stress test though -Discussed with cardiology Dr. Jolly MangoPeter Nixon who recommended discharging the patient home with close follow-up and she will need to go on Entresto, metoprolol, atorvastatin, spironolactone, furosemide -Cardiology recommending anticipating repeating her echo in 3 months to evaluate improvement in LV function and if EF remains low we will likely consider an ICD for primary prevention  Sarcoidosis -Chronic with mild pulmonary complications such as nodularity and hilar lymphadenopathy. -Likely the shortness of breath can be explained by her CHF -If necessary will obtain pulmonary evaluation but likely her shortness of breath is attributed to CHF -Mainly has skin involvement -We will need to discuss with pulmonary eventually -Cardiology is planning a cardiac MRI as an outpatient to evaluate right ventricle and left ventricle and LGE in next week  Pleural Effusion; appears to be improved  -Mild likely secondary to fluid overload  -will diurese and monitor if there is evidence of underlying pulmonary consolidation will repeat chest x-ray after the patient is diuresed -CTA of the chest showed and a small right greater than left pleural effusion with associated atelectasis or consolidation -Repeat chest x-ray had no mention of Pleural Effusion -Today chest x-ray showed cardiomegaly with mild central pulmonary vascular congestion suggesting mild CHF or volume overload but no overt alveolar pulmonary edema and no evidence of pneumonia.  There is bulky perihilar mediastinal lymphadenopathy better demonstrated  on CT is compatible with sarcoidosis  Elevated Troponinin the setting of fluid overload  -likely demand ischemia we will continue to monitor  -Appreciate cardiology input  -Checked ECHO as beow -Risk Stratify with hemoglobin A1c and lipid panel -Lipid panel showed a total cholesterol/HDL ratio  4.4, cholesterol level 163, HDL 37, LDL of 112, triglycerides of 69, and VLDL 14 and hemoglobin A1c was 5.6 -Reportedly had some intermittent chest pain but none now. -Patient [facility] medicine [facility]  and this was intermediate.  Patient has a low EF but normal perfusion study  Cardiomegaly -ECHOCardiogram as below and shows that the cavity size is moderately dilated -CT scan showed cardiomegaly and trace pericardial effusion -As above -Repeat chest x-ray as above -Follow-up with cardiology in outpatient setting  Lymphadenopathy, hilar -Most likely secondary to sarcoidosis -CTA showed "Bulky bilateral hilar and mediastinal lymphadenopathy, largest right hilar lymph nodes measuring at least 3.2 x 2.7 cm. By report, patient has known diagnosis of sarcoidosis." -Going for cardiac MRI later this week  Pulmonary Nodules -Noted on CTA and showed multiple bilateral pulmonary nodules with the largest in the right upper lobe measuring 1.0 centimeters.  These were nonspecific and a repeat follow-up CT as clinically indicated in 3 to 6 months to prove imaging stability  Chronic Interstitial Cystitis -Her daughter has a history of trouble voiding -Currently is on Pitocin polysulfate 1 capsule 3 times a day before meals and will resume -Also taking Uribel which will resume along with Diazepam for Bladder Spasms  Sinus tachycardia / MAT -in the setting of patient ambulating review of EKG showed no evidence of A. fib but there is a lot of atrial ectopy.  -Will monitor patient on telemetry to see if she develops any further worrisome rhythm.  -No longer on Diltiazem due to Concern for worsening CHF -Checked TSH and was 1.344 -Cardiology planning on outpatient cardiac MRI 10 diet for cardiac involvement and considering a cardiac event monitor as outpatient to determine arrhythmia burden -Continue with BB at D/C  Prolonged QT interval  -Will monitor on Telemetry   -Avoid QT prolonging medications, rehydrate correct electrolytes -Repeat EKG this AM showing qTc of 506  Normocytic Anemia -Patient's hemoglobin/hematocrit went from 11.3/37.2 is now 11.0/37.0 -Check Anemia Panel in the a.m. -Continue monitor for signs and symptoms of bleeding; currently no overt bleeding noted -Repeat CBC in the a.m.  Hypokalemia, improved  -In the setting of IV Lasix -Patient potassium yesterady morning was 2.6; Now improved and is 4.6 -Replete with p.o. potassium chloride 40 equivalents earlier this morning and then repeated again with 40 mg p.o. twice daily -Continue to monitor and replete as necessary  Nausea -Unclear etiology but will start metoclopramide 5 mg every 8 PRN for nausea vomiting given prolonged QT  Hyperlipidemia -Patient was started on Atorvastatin 20 g p.o. nightly  Obesity -Estimated body mass index is 30.15 kg/m as calculated from the following:   Height as of this encounter: 4\' 11"  (1.499 m).   Weight as of this encounter: 67.7 kg. -Weight Loss and Dietary Counseling given   Mitral Regurgitation -Noted to be moderate to severe on echo--cardiology planning on repeating echo in 3 months after goal-directed heart failure medications are in place and will consider outpatient work-up if no improvement on repeat echo in 3 months -Follow up with Outpatient ECHO  Hyperglycemia -HbA1c was checked and was 5.6 -Continue to monitor blood sugars carefully and if necessary will need to place the patient on  a sensitive NovoLog size, insulin before meals  Discharge Instructions  Discharge Instructions    (HEART FAILURE PATIENTS) Call MD:  Anytime you have any of the following symptoms: 1) 3 pound weight gain in 24 hours or 5 pounds in 1 week 2) shortness of breath, with or without a dry hacking cough 3) swelling in the hands, feet or stomach 4) if you have to sleep on extra pillows at night in order to breathe.   Complete by: As directed     Call MD for:  difficulty breathing, headache or visual disturbances   Complete by: As directed    Call MD for:  extreme fatigue   Complete by: As directed    Call MD for:  hives   Complete by: As directed    Call MD for:  persistant dizziness or light-headedness   Complete by: As directed    Call MD for:  persistant nausea and vomiting   Complete by: As directed    Call MD for:  redness, tenderness, or signs of infection (pain, swelling, redness, odor or green/yellow discharge around incision site)   Complete by: As directed    Call MD for:  severe uncontrolled pain   Complete by: As directed    Call MD for:  temperature >100.4   Complete by: As directed    Diet - low sodium heart healthy   Complete by: As directed    Discharge instructions   Complete by: As directed    You were cared for by a hospitalist during your hospital stay. If you have any questions about your discharge medications or the care you received while you were in the hospital after you are discharged, you can call the unit and ask to speak with the hospitalist on call if the hospitalist that took care of you is not available. Once you are discharged, your primary care physician will handle any further medical issues. Please note that NO REFILLS for any discharge medications will be authorized once you are discharged, as it is imperative that you return to your primary care physician (or establish a relationship with a primary care physician if you do not have one) for your aftercare needs so that they can reassess your need for medications and monitor your lab values.  Follow up with PCP and Cardiology with 1 week. Take all medications as prescribed. If symptoms change or worsen please return to the ED for evaluation   Increase activity slowly   Complete by: As directed      Allergies as of 03/18/2019      Reactions   Cinnamon Other (See Comments)   MOUTH ULCERS      Medication List    STOP taking these  medications   pentosan polysulfate 100 MG capsule Commonly known as: Elmiron     TAKE these medications   acetaminophen 500 MG tablet Commonly known as: TYLENOL Take 1,000 mg by mouth every 6 (six) hours as needed for headache.   atorvastatin 20 MG tablet Commonly known as: LIPITOR Take 1 tablet (20 mg total) by mouth daily at 6 PM.   Calcium Glycerophosphate 340 (65-50) MG (CA-P) Tabs Commonly known as: Prelief Take 1 capsule by mouth 3 (three) times daily before meals.   diazepam 5 MG tablet Commonly known as: VALIUM Place 5 mg vaginally as directed.   furosemide 20 MG tablet Commonly known as: LASIX Take 1 tablet (20 mg total) by mouth daily. Start taking on: March 19, 2019   guaiFENesin  600 MG 12 hr tablet Commonly known as: MUCINEX Take 1 tablet (600 mg total) by mouth 2 (two) times daily.   hydrOXYzine 10 MG tablet Commonly known as: ATARAX/VISTARIL Take 1 tablet (10 mg total) by mouth 3 (three) times daily as needed for itching or anxiety (if medication causes sleepiness, may take  hs.).   metoprolol succinate 50 MG 24 hr tablet Commonly known as: TOPROL-XL Take 1 tablet (50 mg total) by mouth daily. Start taking on: March 19, 2019   oxyCODONE-acetaminophen 5-325 MG tablet Commonly known as: Roxicet Take 1 tablet by mouth every 6 (six) hours as needed for pain.   sacubitril-valsartan 24-26 MG Commonly known as: ENTRESTO Take 1 tablet by mouth 2 (two) times daily.   spironolactone 25 MG tablet Commonly known as: ALDACTONE Take 0.5 tablets (12.5 mg total) by mouth daily. Start taking on: March 19, 2019   Uribel 118 MG Caps Take 1 capsule (118 mg total) by mouth 3 times/day as needed-between meals & bedtime.      Follow-up Information    Bensimhon, Bevelyn Buckles, MD Follow up on 03/23/2019.   Specialty: Cardiology Why: Please arrive 15 minutes early for your 1:30pm post-hospital cardiology follow-up appointment. You will see Tonye Becket, NP and Dr. Gala Romney.  The parking code is 9009. Please call the office if you need directions.  Contact information: [address] [place] [address] [phone]        [facility]. Call.   Specialty: Family Medicine Why: Follow up within 1-2 weeks Contact information: [address] [place] [address] [phone]        Quintella Reichert, MD .   Specialty: Cardiology Contact information: [address] [place] [address] [phone]          Allergies  Allergen Reactions  . Cinnamon Other (See Comments)    MOUTH ULCERS   Consultations:  Cardiology  Procedures/Studies: Ct Angio Chest Pe W/cm &/or Wo Cm  Result Date: 03/15/2019 CLINICAL DATA:  Shortness of breath, cough, lower extremity swelling, rule out PE EXAM: CT ANGIOGRAPHY CHEST WITH CONTRAST TECHNIQUE: Multidetector CT imaging of the chest was performed using the standard protocol during bolus administration of intravenous contrast. Multiplanar CT image reconstructions and MIPs were obtained to evaluate the vascular anatomy. CONTRAST:  OMNIPAQUE IOHEXOL 350 MG/ML SOLN COMPARISON:  Same day chest radiograph FINDINGS: Cardiovascular: Satisfactory opacification of the pulmonary arteries to the segmental level. No evidence of pulmonary embolism. Cardiomegaly. Trace pericardial effusion. Mediastinum/Nodes: Bulky bilateral hilar and mediastinal lymphadenopathy, largest right hilar lymph nodes measuring at least 3.2 x 2.7 cm. Enlarged left lobe of the thyroid, poorly imaged due to adjacent dense streak artifact from contrast bolus. Trachea, and esophagus demonstrate no significant findings. Lungs/Pleura: Multiple bilateral pulmonary nodules, largest in the right upper lobe measuring 1.0 cm (series 5, image 33). There is extensive geographic airspace attenuation suggesting small airways disease. Small, right greater than left bilateral pleural effusions. Upper Abdomen: No acute  abnormality. Musculoskeletal: No chest wall abnormality. No acute or significant osseous findings. Review of the MIP images confirms the above findings. IMPRESSION: 1.  Negative examination for pulmonary embolism. 2. Bulky bilateral hilar and mediastinal lymphadenopathy, largest right hilar lymph nodes measuring at least 3.2 x 2.7 cm. By report, patient has known diagnosis of sarcoidosis. 3. Small, right greater than left pleural effusions with associated atelectasis or consolidation. 4. Multiple bilateral pulmonary nodules, largest in the right upper lobe measuring 1.0 cm (  series 5, image 33). These are nonspecific. Recommend follow-up CT at 3-6 months to evaluate for initial stability if there is no prior imaging available to prove stability. 5. There is extensive geographic airspace attenuation suggesting air trapping and small airways disease. 6.  Cardiomegaly and trace pericardial effusion. Electronically Signed   By: Lauralyn Primes M.D.   On: 03/15/2019 14:13   Nm Myocar Multi W/spect W/wall Motion / Ef  Result Date: 03/17/2019  There was no ST segment deviation noted during stress.  No T wave inversion was noted during stress.  The left ventricular ejection fraction is moderately decreased (30-44%).  Nuclear stress EF: 30%.  The perfusion study is normal.  This is an intermediate risk study due to LV dysfunction.  Normal myocardial perfusion with no ischemia noted. Moderate LV dysfunction with EF 30%. Intermediate risk study due to LV dysfunction.    Dg Chest Port 1 View  Result Date: 03/18/2019 CLINICAL DATA:  Shortness of breath. EXAM: PORTABLE CHEST 1 VIEW COMPARISON:  Chest x-rays dated 03/17/2019 and 03/15/2019. Chest CT dated 03/15/2019. FINDINGS: Stable cardiomegaly. Overall cardiomediastinal silhouette is stable. Prominent perihilar shadows are stable, compatible with the bulky perihilar and mediastinal lymphadenopathy demonstrated on earlier chest CT angiogram, compatible with sarcoidosis  by report. Superimposed mild central pulmonary vascular congestion without overt alveolar pulmonary edema. No confluent opacity to suggest a developing pneumonia. No pleural effusion or pneumothorax seen. Osseous structures about the chest are unremarkable. IMPRESSION: 1. Cardiomegaly with mild central pulmonary vascular congestion suggesting mild CHF/volume overload. No overt alveolar pulmonary edema. No evidence of pneumonia. 2. Bulky perihilar and mediastinal lymphadenopathy better demonstrated on earlier chest CTs, compatible with sarcoidosis by report. Electronically Signed   By: Bary Richard M.D.   On: 03/18/2019 07:07   Dg Chest Port 1 View  Result Date: 03/17/2019 CLINICAL DATA:  Shortness of breath. EXAM: PORTABLE CHEST 1 VIEW COMPARISON:  Radiograph and CT scan of March 15, 2019. FINDINGS: Stable cardiomegaly. Stable right hilar adenopathy is noted. No pneumothorax or pleural effusion is noted. No acute pulmonary disease is noted. Bony thorax is unremarkable. IMPRESSION: Stable cardiomegaly and right hilar adenopathy. No significant pulmonary abnormality is noted. Electronically Signed   By: Lupita Raider M.D.   On: 03/17/2019 07:20   Dg Chest Port 1 View  Result Date: 03/15/2019 CLINICAL DATA:  One week history of cough and shortness of breath. EXAM: PORTABLE CHEST 1 VIEW COMPARISON:  None. FINDINGS: Borderline cardiac enlargement given the AP projection and portable technique. Prominent mediastinal and hilar contours. Could not exclude adenopathy. Central vascular congestion and increased interstitial markings could suggest CHF. Suspect small bilateral pleural effusions, right greater than left with overlying atelectasis. IMPRESSION: 1. Suspect mild CHF with interstitial edema and small effusions. 2. Findings worrisome for mediastinal and hilar adenopathy. I would recommend a repeat two-view chest film once the patient's symptoms of improved to reassess this. A chest CT with contrast may be  necessary. 3. No definite infiltrates. Electronically Signed   By: Rudie Meyer M.D.   On: 03/15/2019 12:20    ECHOCARDIOGRAM IMPRESSIONS   1. The left ventricle has moderate-severely reduced systolic function, with an ejection fraction of 30-35%. The cavity size was moderately dilated. Left ventricular diastolic Doppler parameters are consistent with pseudonormalization. Elevated mean left atrial pressure. 2. The right ventricle has normal systolic function. The cavity was normal. There is no increase in right ventricular wall thickness. 3. Left atrial size was severely dilated. 4. The mitral valve is degenerative.  Moderate thickening of the mitral valve leaflet. Moderate calcification of the mitral valve leaflet. Mitral valve regurgitation is moderate to severe by color flow Doppler. 5. Restricted posterior leaflet motion. 6. Tricuspid valve regurgitation is moderate. 7. The aortic valve is tricuspid. Moderate thickening of the aortic valve. Moderate calcification of the aortic valve. Aortic valve regurgitation is trivial by color flow Doppler. 8. The aortic root is normal in size and structure. 9. The interatrial septum was not well visualized.  FINDINGS Left Ventricle: The left ventricle has moderate-severely reduced systolic function, with an ejection fraction of 30-35%. The cavity size was moderately dilated. There is no increase in left ventricular wall thickness. Left ventricular diastolic Doppler  parameters are consistent with pseudonormalization. Elevated mean left atrial pressure  Right Ventricle: The right ventricle has normal systolic function. The cavity was normal. There is no increase in right ventricular wall thickness.  Left Atrium: Left atrial size was severely dilated.  Right Atrium: Right atrial size was normal in size. Right atrial pressure is estimated at 10 mmHg.  Interatrial Septum: The interatrial septum was not well  visualized.  Pericardium: There is no evidence of pericardial effusion.  Mitral Valve: The mitral valve is degenerative in appearance. Moderate thickening of the mitral valve leaflet. Moderate calcification of the mitral valve leaflet. Mitral valve regurgitation is moderate to severe by color flow Doppler. Restricted  posterior leaflet motion.  Tricuspid Valve: The tricuspid valve is normal in structure. Tricuspid valve regurgitation is moderate by color flow Doppler.  Aortic Valve: The aortic valve is tricuspid Moderate thickening of the aortic valve. Moderate calcification of the aortic valve. Aortic valve regurgitation is trivial by color flow Doppler. There is no evidence of aortic valve stenosis.  Pulmonic Valve: The pulmonic valve was normal in structure. Pulmonic valve regurgitation is mild by color flow Doppler.  Aorta: The aortic root is normal in size and structure.   +--------------+--------++ LEFT VENTRICLE  +----------------+---------++ +--------------+--------++ Diastology   PLAX 2D   +----------------+---------++ +--------------+--------++ LV e' lateral: 5.00 cm/s LVIDd: 5.10 cm  +----------------+---------++ +--------------+--------++ LV E/e' lateral:23.8  LVIDs: 4.40 cm  +----------------+---------++ +--------------+--------++ LV e' medial: 5.44 cm/s LV PW: 0.80 cm  +----------------+---------++ +--------------+--------++ LV E/e' medial: 21.9  LV IVS: 0.70 cm  +----------------+---------++ +--------------+--------++ LVOT diam: 2.00 cm  +--------------+--------++ LV SV: 36 ml  +--------------+--------++ LV SV Index: 20.39  +--------------+--------++ LVOT Area: 3.14 cm +--------------+--------++     +--------------+--------++  +------------------+---------++ LV Volumes (MOD)   +------------------+---------++ LV area d, A2C: 36.10 cm +------------------+---------++ LV area d, A4C: 35.70 cm +------------------+---------++ LV area s, A2C: 30.20 cm +------------------+---------++ LV area s, A4C: 28.30 cm +------------------+---------++ LV major d, A2C: 7.85 cm  +------------------+---------++ LV major d, A4C: 8.03 cm  +------------------+---------++ LV major s, A2C: 7.66 cm  +------------------+---------++ LV major s, A4C: 7.26 cm  +------------------+---------++ LV vol d, MOD A2C:140.0 ml  +------------------+---------++ LV vol d, MOD A4C:128.0 ml  +------------------+---------++ LV vol s, MOD A2C:101.0 ml  +------------------+---------++ LV vol s, MOD A4C:93.5 ml  +------------------+---------++ LV SV MOD A2C: 39.0 ml  +------------------+---------++ LV SV MOD A4C: 128.0 ml  +------------------+---------++ LV SV MOD BP: 34.6 ml  +------------------+---------++  +---------------+---------++ RIGHT VENTRICLE  +---------------+---------++ RV Basal diam: 3.00 cm  +---------------+---------++ RV Mid diam: 2.00 cm  +---------------+---------++ RV S prime: 9.46 cm/s +---------------+---------++ TAPSE (M-mode):1.8 cm  +---------------+---------++  +---------------+--------++-----------++ LEFT ATRIUM  Index  +---------------+--------++-----------++ LA diam: 5.20 cm 3.10 cm/m  +---------------+--------++-----------++ LA Vol (A2C): 93.0 ml 55.44 ml/m +---------------+--------++-----------++ LA Vol (A4C): 105.0 ml62.59 ml/m +---------------+--------++-----------++  LA Biplane Vol:102.0 ml60.81  ml/m +---------------+--------++-----------++ +------------------+-----------++ +--------------+--------++ AORTIC VALVE   PULMONIC VALVE  +------------------+-----------++ +--------------+--------++ AV Area (Vmax): 2.61 cm  PV Vmax: 1.02 m/s +------------------+-----------++ +--------------+--------++ AV Area (Vmean): 2.79 cm  PV Peak grad: 4.2 mmHg +------------------+-----------++ +--------------+--------++ AV Area (VTI): 2.90 cm  +------------------+-----------++ AV Vmax: 129.00 cm/s +------------------+-----------++ AV Vmean: 88.700 cm/s +------------------+-----------++ AV VTI: 0.228 m  +------------------+-----------++ AV Peak Grad: 6.7 mmHg  +------------------+-----------++ AV Mean Grad: 3.5 mmHg  +------------------+-----------++ LVOT Vmax: 107.00 cm/s +------------------+-----------++ LVOT Vmean: 78.800 cm/s +------------------+-----------++ LVOT VTI: 0.210 m  +------------------+-----------++ LVOT/AV VTI ratio:0.92  +------------------+-----------++  +-------------+-------++ AORTA   +-------------+-------++ Ao Root diam:2.60 cm +-------------+-------++ Ao Asc diam: 3.00 cm +-------------+-------++  +--------------+----------++ +---------------+-----------++ MITRAL VALVE   TRICUSPID VALVE  +--------------+----------++ +---------------+-----------++ MV Area (PHT):4.49 cm  TR Peak grad: 29.2 mmHg  +--------------+----------++ +---------------+-----------++ MV Peak grad: 7.1 mmHg  TR Vmax: 284.00 cm/s +--------------+----------++ +---------------+-----------++ MV Mean grad: 3.0 mmHg  +--------------+----------++ +--------------+-------+ MV Vmax: 1.33 m/s  SHUNTS    +--------------+----------++ +--------------+-------+ MV Vmean: 83.9 cm/s  Systemic VTI: 0.21 m  +--------------+----------++ +--------------+-------+ MV VTI: 0.42 m  Systemic Diam:2.00 cm +--------------+----------++ +--------------+-------+ MV PHT: 49.01 msec +--------------+----------++ MV Decel Time:169 msec  +--------------+----------++ +--------------+-----------++ MV E velocity:119.00 cm/s +--------------+-----------++ MV A velocity:99.90 cm/s  +--------------+-----------++ MV E/A ratio: 1.19  +--------------+-----------++  NM STRESS TEST  There was no ST segment deviation noted during stress.  No T wave inversion was noted during stress.  The left ventricular ejection fraction is moderately decreased (30-44%).  Nuclear stress EF: 30%.  The perfusion study is normal.  This is an intermediate risk study due to LV dysfunction.   Normal myocardial perfusion with no ischemia noted. Moderate LV dysfunction with EF 30%. Intermediate risk study due to LV dysfunction.    Subjective: Seen and examined at bedside and she is doing better.  Denies chest pain, lightheadedness or dizziness.  No nausea or vomiting.  Felt well and was ready to go home and ambulated without desaturating.  Has an appointment with the advanced heart failure team of this week.  Discharge Exam: Vitals:   03/18/19 1000 03/18/19 1400  BP: (!) 136/58   Pulse: 73   Resp: 16   Temp:  98.2 F (36.8 C)  SpO2: 98%    Vitals:   03/18/19 0800 03/18/19 0957 03/18/19 1000 03/18/19 1400  BP: 130/76 111/68 (!) 136/58   Pulse: 79 74 73   Resp: (!) 37 13 16   Temp:    98.2 F (36.8 C)  TempSrc:    Oral  SpO2: 97% 98% 98%   Weight:      Height:       General: Pt is alert, awake, not in acute distress Cardiovascular: RRR, S1/S2 +, no rubs, no gallops Respiratory: Diminished bilaterally, no wheezing, no rhonchi Abdominal: Soft, NT,  Distended 2/2 body habitus, bowel sounds + Extremities: Trace edema, no cyanosis; Has Sarcoid lesions  The results of significant diagnostics from this hospitalization (including imaging, microbiology, ancillary and laboratory) are listed below for reference.    Microbiology: Recent Results (from the past 240 hour(s))  SARS Coronavirus 2 (CEPHEID - Performed in [facility] hospital lab), Hosp Order     Status: None   Collection Time: 03/15/19 12:12 PM   Specimen: Nasopharyngeal Swab  Result Value Ref Range Status   SARS Coronavirus 2 NEGATIVE NEGATIVE Final    Comment: (NOTE) If result is NEGATIVE SARS-CoV-2 target nucleic acids are NOT DETECTED. The SARS-CoV-2 RNA is generally detectable in upper  and lower  respiratory specimens during the acute phase of infection. The lowest  concentration of SARS-CoV-2 viral copies this assay can detect is 250  copies / mL. A negative result does not preclude SARS-CoV-2 infection  and should not be used as the sole basis for treatment or other  patient management decisions.  A negative result may occur with  improper specimen collection / handling, submission of specimen other  than nasopharyngeal swab, presence of viral mutation(s) within the  areas targeted by this assay, and inadequate number of viral copies  (<250 copies / mL). A negative result must be combined with clinical  observations, patient history, and epidemiological information. If result is POSITIVE SARS-CoV-2 target nucleic acids are DETECTED. The SARS-CoV-2 RNA is generally detectable in upper and lower  respiratory specimens dur ing the acute phase of infection.  Positive  results are indicative of active infection with SARS-CoV-2.  Clinical  correlation with patient history and other diagnostic information is  necessary to determine patient infection status.  Positive results do  not rule out bacterial infection or co-infection with other viruses. If result is PRESUMPTIVE  POSTIVE SARS-CoV-2 nucleic acids MAY BE PRESENT.   A presumptive positive result was obtained on the submitted specimen  and confirmed on repeat testing.  While 2019 novel coronavirus  (SARS-CoV-2) nucleic acids may be present in the submitted sample  additional confirmatory testing may be necessary for epidemiological  and / or clinical management purposes  to differentiate between  SARS-CoV-2 and other Sarbecovirus currently known to infect humans.  If clinically indicated additional testing with an alternate test  methodology [phone]) is advised. The SARS-CoV-2 RNA is generally  detectable in upper and lower respiratory sp ecimens during the acute  phase of infection. The expected result is Negative. Fact Sheet for Patients:  BoilerBrush.com.cy Fact Sheet for Healthcare Providers: https://pope.com/ This test is not yet approved or cleared by the [place] FDA and has been authorized for detection and/or diagnosis of SARS-CoV-2 by FDA under an Emergency Use Authorization (EUA).  This EUA will remain in effect (meaning this test can be used) for the duration of the COVID-19 declaration under Section 564(b)(1) of the Act, 21 U.S.C. section 360bbb-3(b)(1), unless the authorization is terminated or revoked sooner. Performed at [facility], [address] [place], [place] [address]   MRSA PCR Screening     Status: None   Collection Time: 03/15/19  6:24 PM   Specimen: Nasal Mucosa; Nasopharyngeal  Result Value Ref Range Status   MRSA by PCR NEGATIVE NEGATIVE Final    Comment:        The GeneXpert MRSA Assay (FDA approved for NASAL specimens only), is one component of a comprehensive MRSA colonization surveillance program. It is not intended to diagnose MRSA infection nor to guide or monitor treatment for MRSA infections. Performed at [facility], [address], [place], [place] [address]      Labs: BNP (last 3 results) Recent Labs    03/15/19 1206  BNP 446.6*   Basic Metabolic Panel: Recent Labs  Lab 03/15/19 1206 03/16/19 0225 03/16/19 1713 03/17/19 0245 03/18/19 0343  NA 143 140 139 138 138  K 3.6 2.6* 4.2 4.7 4.6  CL 106 103 102 102 104  CO2 GLUCOSE 110* 91 125* 124* 114*  BUN CREATININE 0.73 0.70 1.04* 0.99 0.77  CALCIUM 8.8* 8.1* 8.7* 8.6* 8.6*  MG  --  1.7  --  2.7* 2.1  PHOS  --  3.1  --  3.4 4.1   Liver Function Tests: Recent Labs  Lab 03/16/19 0225 03/17/19 0245 03/18/19 0343  AST 27 27 25   ALT 14 13 13   ALKPHOS 112 108 109  BILITOT 0.6 0.7 0.6  PROT 6.1* 6.3* 6.0*  ALBUMIN 3.3* 3.4* 3.2*   No results for input(s): LIPASE, AMYLASE in the last 168 hours. No results for input(s): AMMONIA in the last 168 hours. CBC: Recent Labs  Lab 03/15/19 1206 03/16/19 0225 03/17/19 0245 03/18/19 0343  WBC 4.7 4.6 7.5 5.1  NEUTROABS 3.8  --  5.6 3.6  HGB 11.3* 11.0* 11.0* 11.1*  HCT 37.2 36.3 37.0 37.4  MCV 92.8 91.7 94.4 94.0  PLT 276 269 310 302   Cardiac Enzymes: No results for input(s): CKTOTAL, CKMB, CKMBINDEX, TROPONINI in the last 168 hours. BNP: Invalid input(s): POCBNP CBG: No results for input(s): GLUCAP in the last 168 hours. D-Dimer No results for input(s): DDIMER in the last 72 hours. Hgb A1c Recent Labs    03/16/19 0225  HGBA1C 5.6   Lipid Profile Recent Labs    03/16/19 0225  CHOL 163  HDL 37*  LDLCALC 112*  TRIG 69  CHOLHDL 4.4   Thyroid function studies Recent Labs    03/15/19 2007  TSH 1.344   Anemia work up No results for input(s): VITAMINB12, FOLATE, FERRITIN, TIBC, IRON, RETICCTPCT in the last 72 hours. Urinalysis    Component Value Date/Time   COLORURINE STRAW (A) 03/15/2019 2353   APPEARANCEUR CLEAR 03/15/2019 2353   LABSPEC 1.018 03/15/2019 2353   PHURINE 6.0 03/15/2019 2353   GLUCOSEU NEGATIVE 03/15/2019 2353   HGBUR NEGATIVE 03/15/2019 2353   BILIRUBINUR  NEGATIVE 03/15/2019 2353   KETONESUR NEGATIVE 03/15/2019 2353   PROTEINUR NEGATIVE 03/15/2019 2353   NITRITE NEGATIVE 03/15/2019 2353   LEUKOCYTESUR NEGATIVE 03/15/2019 2353   Sepsis Labs Invalid input(s): PROCALCITONIN,  WBC,  LACTICIDVEN Microbiology Recent Results (from the past 240 hour(s))  SARS Coronavirus 2 (CEPHEID - Performed in [facility] hospital lab), Hosp Order     Status: None   Collection Time: 03/15/19 12:12 PM   Specimen: Nasopharyngeal Swab  Result Value Ref Range Status   SARS Coronavirus 2 NEGATIVE NEGATIVE Final    Comment: (NOTE) If result is NEGATIVE SARS-CoV-2 target nucleic acids are NOT DETECTED. The SARS-CoV-2 RNA is generally detectable in upper and lower  respiratory specimens during the acute phase of infection. The lowest  concentration of SARS-CoV-2 viral copies this assay can detect is 250  copies / mL. A negative result does not preclude SARS-CoV-2 infection  and should not be used as the sole basis for treatment or other  patient management decisions.  A negative result may occur with  improper specimen collection / handling, submission of specimen other  than nasopharyngeal swab, presence of viral mutation(s) within the  areas targeted by this assay, and inadequate number of viral copies  (<250 copies / mL). A negative result must be combined with clinical  observations, patient history, and epidemiological information. If result is POSITIVE SARS-CoV-2 target nucleic acids are DETECTED. The SARS-CoV-2 RNA is generally detectable in upper and lower  respiratory specimens dur ing the acute phase of infection.  Positive  results are indicative of active infection with SARS-CoV-2.  Clinical  correlation with patient history and other diagnostic information is  necessary to determine patient infection status.  Positive results do  not rule out bacterial infection  or co-infection with other viruses. If result is PRESUMPTIVE POSTIVE SARS-CoV-2  nucleic acids MAY BE PRESENT.   A presumptive positive result was obtained on the submitted specimen  and confirmed on repeat testing.  While 2019 novel coronavirus  (SARS-CoV-2) nucleic acids may be present in the submitted sample  additional confirmatory testing may be necessary for epidemiological  and / or clinical management purposes  to differentiate between  SARS-CoV-2 and other Sarbecovirus currently known to infect humans.  If clinically indicated additional testing with an alternate test  methodology [phone]) is advised. The SARS-CoV-2 RNA is generally  detectable in upper and lower respiratory sp ecimens during the acute  phase of infection. The expected result is Negative. Fact Sheet for Patients:  BoilerBrush.com.cy Fact Sheet for Healthcare Providers: https://pope.com/ This test is not yet approved or cleared by the [place] FDA and has been authorized for detection and/or diagnosis of SARS-CoV-2 by FDA under an Emergency Use Authorization (EUA).  This EUA will remain in effect (meaning this test can be used) for the duration of the COVID-19 declaration under Section 564(b)(1) of the Act, 21 U.S.C. section 360bbb-3(b)(1), unless the authorization is terminated or revoked sooner. Performed at [facility], [address] [place], [place] [address]   MRSA PCR Screening     Status: None   Collection Time: 03/15/19  6:24 PM   Specimen: Nasal Mucosa; Nasopharyngeal  Result Value Ref Range Status   MRSA by PCR NEGATIVE NEGATIVE Final    Comment:        The GeneXpert MRSA Assay (FDA approved for NASAL specimens only), is one component of a comprehensive MRSA colonization surveillance program. It is not intended to diagnose MRSA infection nor to guide or monitor treatment for MRSA infections. Performed at [facility], [address] [place], [place] [address]    Time coordinating  discharge: 35 minutes  SIGNED:  Merlene Laughter, DO [facility] 03/18/2019, 5:31 PM Pager is on AMION  If 7PM-7AM, please contact night-coverage [email or web address] Password [email or web address]

## 2019-03-22 NOTE — Progress Notes (Signed)
ADVANCED HF CLINIC CONSULT NOTE  Referring Physician: Dr Eden EmmsNishan  Primary Care: Primary Cardiologist:Dr Eden EmmsNishan   HPI: Robin Estes is a 73 year old referred to the HF clinic by Dr Eden EmmsNishan for HF consultation.   73 year old with a history of interstitial cystitis, sarcoidosis (eye, lung, and skin), osteopenia, MAT, HTN,MR, pulmonary nodules on CT 03/2019, and recently diagnosed systolic heart failure. Followed by dermatology for sarcoid.   [facility]. EKG showed A fib/MAT. Started on diltiazem drip and later swtiched to toprol xl. ECHO showed reduced EF 30-35%  and mod-severe MR. Diuresed with IV lasix and transitioned to po.  Started on HF medications.   Overall feeling fine. Complaining of fatigue.Having mild chest discomfort.  Mild SOB with exertion. Denies PND/Orthopnea. Appetite ok. No fever or chills. Weight at home 142-148  pounds. Taking all medications.   ECHO 03/2019-  30-35% , RV normal, MV mod-severe MR.   Myoview 03/2019: no ischemia, moderate LV dysfunction 30%    Review of Systems: [y] = yes, [ ]  = no   General: Weight gain [ ] ; Weight loss [ ] ; Anorexia [ ] ; Fatigue [Y ]; Fever [ ] ; Chills [ ] ; Weakness [Y ]  Cardiac: Chest pain/pressure [ ] ; Resting SOB [ ] ; Exertional SOB [ Y]; Orthopnea [ ] ; Pedal Edema [ ] ; Palpitations [ ] ; Syncope [ ] ; Presyncope [ ] ; Paroxysmal nocturnal dyspnea[ ]   Pulmonary: Cough [ ] ; Wheezing[ ] ; Hemoptysis[ ] ; Sputum [ ] ; Snoring [ ]   GI: Vomiting[ ] ; Dysphagia[ ] ; Melena[ ] ; Hematochezia [ ] ; Heartburn[ ] ; Abdominal pain [ ] ; Constipation [ ] ; Diarrhea [ ] ; BRBPR [ ]   GU: Hematuria[ ] ; Dysuria [ ] ; Nocturia[ ]   Vascular: Pain in legs with walking [ ] ; Pain in feet with lying flat [ ] ; Non-healing sores [ ] ; Stroke [ ] ; TIA [ ] ; Slurred speech [ ] ;  Neuro: Headaches[ ] ; Vertigo[ ] ; Seizures[ ] ; Paresthesias[ ] ;Blurred vision [ ] ; Diplopia [ ] ; Vision changes [ ]   Ortho/Skin: Arthritis [  ]; Joint pain [ Y]; Muscle pain [ ] ; Joint swelling [ ] ; Back Pain [ ] ; Rash [ ]   Psych: Depression[ ] ; Anxiety[ ]   Heme: Bleeding problems [ ] ; Clotting disorders [ ] ; Anemia [ ]   Endocrine: Diabetes [ ] ; Thyroid dysfunction[ ]    Past Medical History:  Diagnosis Date  . Bladder spasms   . Chronic interstitial cystitis   . Dysuria   . End-stage glaucoma    RIGHT EYE  . Frequency of urination   . Legally blind in right eye, as [place]    SECONDARY TO GLAUCOMA  . Nocturia   . Sarcoidosis of skin   . Sensation of pressure in bladder area   . Urgency of urination     Current Outpatient Medications  Medication Sig Dispense Refill  . acetaminophen (TYLENOL) 500 MG tablet Take 1,000 mg by mouth every 6 (six) hours as needed for headache.    Marland Kitchen. atorvastatin (LIPITOR) 20 MG tablet Take 1 tablet (20 mg total) by mouth daily at 6 PM. 30 tablet 0  . Calcium Glycerophosphate (PRELIEF) 340 (65-50) MG (CA-P) TABS Take 1 capsule by mouth 3 (three) times daily before meals. 90 each 11  . diazepam (VALIUM) 5 MG tablet Place 5 mg vaginally as directed.    . furosemide (LASIX) 20 MG tablet Take 1 tablet (20 mg total) by mouth daily. 30 tablet 0  . guaiFENesin (  MUCINEX) 600 MG 12 hr tablet Take 1 tablet (600 mg total) by mouth 2 (two) times daily. 10 tablet 0  . hydrOXYzine (ATARAX/VISTARIL) 10 MG tablet Take 1 tablet (10 mg total) by mouth 3 (three) times daily as needed for itching or anxiety (if medication causes sleepiness, may take 30mg  hs.). 90 tablet 6  . Meth-Hyo-M Bl-Na Phos-Ph Sal (URIBEL) 118 MG CAPS Take 1 capsule (118 mg total) by mouth 3 times/day as needed-between meals & bedtime. 120 capsule 3  . metoprolol succinate (TOPROL-XL) 50 MG 24 hr tablet Take 1 tablet (50 mg total) by mouth daily. 30 tablet 0  . oxyCODONE-acetaminophen (ROXICET) 5-325 MG per tablet Take 1 tablet by mouth every 6 (six) hours as needed for pain. 30 tablet 0  . sacubitril-valsartan (ENTRESTO) 24-26 MG Take 1  tablet by mouth 2 (two) times daily. 60 tablet 0  . spironolactone (ALDACTONE) 25 MG tablet Take 0.5 tablets (12.5 mg total) by mouth daily. 30 tablet 0   No current facility-administered medications for this visit.    Facility-Administered Medications Ordered in Other Visits  Medication Dose Route Frequency Provider Last Rate Last Dose  . bupivacaine (MARCAINE) 0.5 % 10 mL, triamcinolone acetonide (KENALOG-40) 40 mg injection   Subcutaneous Once Carolan Clines, MD      . bupivacaine (MARCAINE) 0.5 % 15 mL, phenazopyridine (PYRIDIUM) 400 mg bladder mixture   Bladder Instillation Once Carolan Clines, MD        Allergies  Allergen Reactions  . Cinnamon Other (See Comments)    MOUTH ULCERS      Social History   Socioeconomic History  . Marital status: Married    Spouse name: Not on file  . Number of children: Not on file  . Years of education: Not on file  . Highest education level: Not on file  Occupational History  . Not on file  Social Needs  . Financial resource strain: Not on file  . Food insecurity    Worry: Not on file    Inability: Not on file  . Transportation needs    Medical: Not on file    Non-medical: Not on file  Tobacco Use  . Smoking status: Never Smoker  . Smokeless tobacco: Never Used  Substance and Sexual Activity  . Alcohol use: No  . Drug use: No  . Sexual activity: Not on file  Lifestyle  . Physical activity    Days per week: Not on file    Minutes per session: Not on file  . Stress: Not on file  Relationships  . Social [name] on phone: Not on file    Gets together: Not on file    Attends religious service: Not on file    Active member of club or organization: Not on file    Attends meetings of clubs or organizations: Not on file    Relationship status: Not on file  . Intimate partner violence    Fear of current or ex partner: Not on file    Emotionally abused: Not on file    Physically abused: Not on file     Forced sexual activity: Not on file  Other Topics Concern  . Not on file  Social History Narrative  . Not on file      Family History  Problem Relation Age of Onset  . CAD Father   . Sarcoidosis Other     Vitals:   03/23/19 1354  BP: 122/80  Pulse: 91  SpO2:  96%  Weight: 66.3 kg (146 lb 3.2 oz)    PHYSICAL EXAM: General:   No respiratory difficulty HEENT: normal   Hypopigmented rash Neck: supple. no JVD. Carotids 2+ bilat; no bruits. No lymphadenopathy or thryomegaly appreciated. Cor: PMI nondisplaced. Mildly irregular. No rubs, gallops or murmurs. Lungs: clear Abdomen: soft, nontender, nondistended. No hepatosplenomegaly. No bruits or masses. Good bowel sounds. Extremities: no cyanosis, clubbing, rash, edema Neuro: alert & oriented x 3, cranial nerves grossly intact. moves all 4 extremities w/o difficulty. Affect pleasant.  ECG: SR PACS 88 bpm Personally reviewed    ASSESSMENT & PLAN:  1. Chronic Systolic Heart Failure ECHO 03/2019 EF 30-35% . Myoview - no ischemia. 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 Hold off on cath for now.  NYHA IIIb- Volume status stable. Change lasix to 20 mg daily as needed.  Continue Toprol xl 50 mg daily.  - Continue entresto 24-26 mg twice a day.  -Continue 12.5 mg spironolactone daily.  - Check BMET   2. MAT On toprol Xl 50 mg daily . Today she is in NSR. Continue current regimen.   3. Sarcoidosis  With lung nodules noted on CT.    F/U 3-4 weeks   Tonye Becket NP  Patient seen and examined with the above-signed Advanced Practice Provider and/or Housestaff. I personally reviewed laboratory data, imaging studies and relevant notes. I independently examined the patient and formulated the important aspects of the plan. I have edited the note to reflect any of my changes or salient points. I have personally discussed the plan with the patient and/or family.  Robin Estes is a 73 y/o with biopsy proven sarcoid who  was recently diagnosed with systolic HF with EF 30-35% and moderate MR.  Also had MAT/AF.   Feels much better after diuresis and med titration. NYHA II. Denies significant angina.   On exam volume status looks good. Mildly irregular. Soft MR No edema. Sarcoid rash   ECG with NSR with PACs  Etiology of LV dysfunction remains unclear. Suspect sarcoid but may also be related to AF/MAT or ischemia (less likely). Wil continue current meds for now. Will check cMRI. If suspicious for sarcoid will need PET. If not will proceed with cath.   Will see back in a few weeks to discuss results and titrate meds. May need ZioPatch to assess AF burden and consider AC.   I have reviewed ECHO, ecgs, labs and previous notes personally.   Arvilla Meres, MD  7:06 PM

## 2019-03-23 ENCOUNTER — Other Ambulatory Visit: Payer: Self-pay

## 2019-03-23 ENCOUNTER — Ambulatory Visit (HOSPITAL_COMMUNITY)
Admission: RE | Admit: 2019-03-23 | Discharge: 2019-03-23 | Disposition: A | Discharge status: Home / Self Care | Payer: Medicare Other | Source: Ambulatory Visit | Attending: Internal Medicine | Admitting: Internal Medicine

## 2019-03-23 ENCOUNTER — Encounter (HOSPITAL_COMMUNITY): Payer: Self-pay

## 2019-03-23 VITALS — BP 122/80 | HR 91 | Wt 146.2 lb

## 2019-03-23 DIAGNOSIS — R918 Other nonspecific abnormal finding of lung field: Secondary | ICD-10-CM | POA: Diagnosis not present

## 2019-03-23 DIAGNOSIS — D860 Sarcoidosis of lung: Secondary | ICD-10-CM | POA: Diagnosis not present

## 2019-03-23 DIAGNOSIS — R9431 Abnormal electrocardiogram [ECG] [EKG]: Secondary | ICD-10-CM | POA: Diagnosis not present

## 2019-03-23 DIAGNOSIS — I110 Hypertensive heart disease with heart failure: Secondary | ICD-10-CM | POA: Diagnosis present

## 2019-03-23 DIAGNOSIS — Z91018 Allergy to other foods: Secondary | ICD-10-CM | POA: Insufficient documentation

## 2019-03-23 DIAGNOSIS — I5022 Chronic systolic (congestive) heart failure: Secondary | ICD-10-CM | POA: Diagnosis present

## 2019-03-23 DIAGNOSIS — I480 Paroxysmal atrial fibrillation: Secondary | ICD-10-CM

## 2019-03-23 DIAGNOSIS — Z8489 Family history of other specified conditions: Secondary | ICD-10-CM | POA: Insufficient documentation

## 2019-03-23 DIAGNOSIS — H409 Unspecified glaucoma: Secondary | ICD-10-CM | POA: Insufficient documentation

## 2019-03-23 DIAGNOSIS — Z79899 Other long term (current) drug therapy: Secondary | ICD-10-CM | POA: Diagnosis not present

## 2019-03-23 DIAGNOSIS — Z8249 Family history of ischemic heart disease and other diseases of the circulatory system: Secondary | ICD-10-CM | POA: Insufficient documentation

## 2019-03-23 DIAGNOSIS — I491 Atrial premature depolarization: Secondary | ICD-10-CM | POA: Insufficient documentation

## 2019-03-23 LAB — BASIC METABOLIC PANEL
Anion gap: 11 (ref 5–15)
BUN: 14 mg/dL (ref 8–23)
CO2: 22 mmol/L (ref 22–32)
Calcium: 9 mg/dL (ref 8.9–10.3)
Chloride: 102 mmol/L (ref 98–111)
Creatinine, Ser: 0.99 mg/dL (ref 0.44–1.00)
GFR calc Af Amer: >60 mL/min (ref >60)
GFR calc non Af Amer: 56 mL/min — ABNORMAL LOW (ref >60)
Glucose, Bld: 118 mg/dL — ABNORMAL HIGH (ref 70–99)
Potassium: 4.1 mmol/L (ref 3.5–5.1)
Sodium: 135 mmol/L (ref 135–145)

## 2019-03-23 MED ORDER — FUROSEMIDE 20 MG PO TABS: 20.0000 mg | ORAL_TABLET | ORAL | 2 refills | Status: DC | PRN

## 2019-03-23 NOTE — Patient Instructions (Signed)
CHANGE Lasix to 20mg  (1 tab) as needed  Labs today We will only contact you if something comes back abnormal or we need to make some changes. Otherwise no news is good news!  Your physician has requested that you have a cardiac MRI. Cardiac MRI uses a computer to create images of your heart as its beating, producing both still and moving pictures of your heart and major blood vessels. For further information please visit [email or web address]. You will get a call to schedule this appointment.   Your physician recommends that you schedule a follow-up appointment in: 3-4 weeks with Dr Haroldine Laws   At the [facility], you and your health needs are our priority. As part of our continuing mission to provide you with exceptional heart care, we have created designated Provider Care Teams. These Care Teams include your primary Cardiologist (physician) and Advanced Practice Providers (APPs- Physician Assistants and Nurse Practitioners) who all work together to provide you with the care you need, when you need it.   You may see any of the following providers on your designated Care Team at your next follow up: Marland Kitchen Dr Glori Bickers . Dr Loralie Champagne . Darrick Grinder, NP

## 2019-03-29 ENCOUNTER — Encounter: Payer: Self-pay | Admitting: *Deleted

## 2019-04-13 ENCOUNTER — Telehealth (HOSPITAL_COMMUNITY): Payer: Self-pay | Admitting: *Deleted

## 2019-04-13 ENCOUNTER — Other Ambulatory Visit (HOSPITAL_COMMUNITY): Payer: Self-pay

## 2019-04-13 MED ORDER — METOPROLOL SUCCINATE ER 50 MG PO TB24: 50.0000 mg | ORAL_TABLET | Freq: Every day | ORAL | 0 refills | Status: DC

## 2019-04-13 MED ORDER — ATORVASTATIN CALCIUM 20 MG PO TABS: 20.0000 mg | ORAL_TABLET | Freq: Every day | ORAL | 0 refills | Status: DC

## 2019-04-13 MED ORDER — SACUBITRIL-VALSARTAN 24-26 MG PO TABS: 1.0000 | ORAL_TABLET | Freq: Two times a day (BID) | ORAL | 0 refills | Status: DC

## 2019-04-13 MED ORDER — SPIRONOLACTONE 25 MG PO TABS: 12.5000 mg | ORAL_TABLET | Freq: Every day | ORAL | 0 refills | Status: DC

## 2019-04-13 NOTE — Telephone Encounter (Signed)
Pt left vm requesting a call back about a medication concern. I called pt back no answer/left vm for pt to call back.

## 2019-04-14 ENCOUNTER — Telehealth (HOSPITAL_COMMUNITY): Payer: Self-pay | Admitting: Vascular Surgery

## 2019-04-14 NOTE — Telephone Encounter (Signed)
Spoke to pt to reschedule 8/26 appt w/ db , pt states that daughter will call to resch appt

## 2019-04-21 ENCOUNTER — Telehealth (HOSPITAL_COMMUNITY): Payer: Self-pay | Admitting: Emergency Medicine

## 2019-04-21 NOTE — Telephone Encounter (Signed)
Left message on voicemail with name and callback number Messina Kosinski RN Navigator Cardiac Imaging [facility] [phone] Office [phone] Cell  

## 2019-04-24 ENCOUNTER — Ambulatory Visit (HOSPITAL_COMMUNITY)
Admission: RE | Admit: 2019-04-24 | Discharge: 2019-04-24 | Disposition: A | Discharge status: Home / Self Care | Payer: BC Managed Care – PPO | Source: Ambulatory Visit | Attending: Adult Health | Admitting: Adult Health

## 2019-04-24 ENCOUNTER — Other Ambulatory Visit: Payer: Self-pay

## 2019-04-24 DIAGNOSIS — I5022 Chronic systolic (congestive) heart failure: Secondary | ICD-10-CM | POA: Insufficient documentation

## 2019-04-24 IMAGING — MR MR CARDIA MORPHOLOGY WITHOUT AND WITH CONTRAST
14 of 16 series · 38 of 40 positions shown · IV contrast (Gadavist)
Comparison: none

CLINICAL DATA: Cardiomyopathy, history of sarcoidosis.

EXAM:
CARDIAC MRI
TECHNIQUE: The patient was scanned on a 1.5 Tesla GE magnet. A dedicated
cardiac coil was used. Functional imaging was done using Fiesta
sequences. [DATE], and 4 chamber views were done to assess for RWMA's.
Modified JUMPER rule using a short axis stack was used to
calculate an ejection fraction on a dedicated work station using
Circle software. The patient received 8 cc of Gadavist. After 10
minutes inversion recovery sequences were used to assess for
infiltration and scar tissue.
CONTRAST:  8 cc Gadavist

[Series 7: t2_stir_db_sax · oblique · 8.0mm · 1.54mm/px · 1 of 15 slices shown]
[im 1/15]
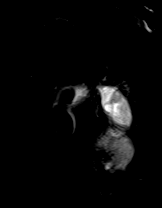

[Series 11: bSSFP · oblique · 8.0mm · 1.61mm/px · 25 of 350 slices shown (1 of 4)]
[im 1/350]
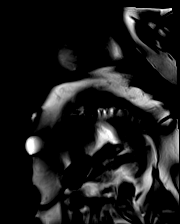
[im 15/350]
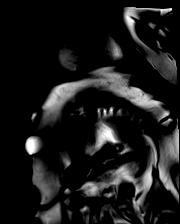
[im 30/350]
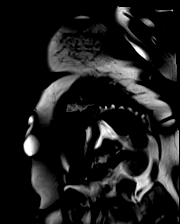
[im 44/350]
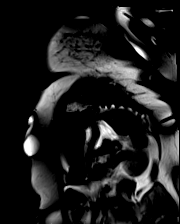
[im 59/350]
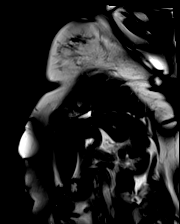
[im 73/350]
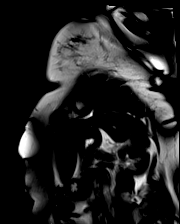
[im 88/350]
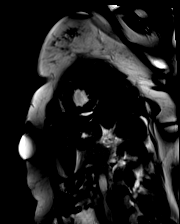
[im 102/350]
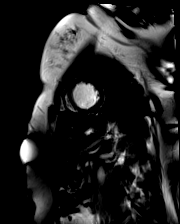
[im 117/350]
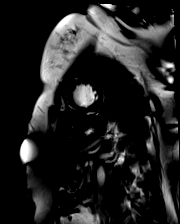
[im 131/350]
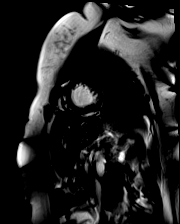
[im 146/350]
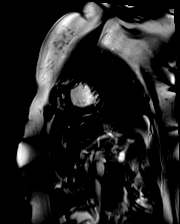
[im 160/350]
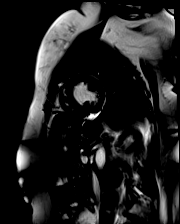
[im 175/350]
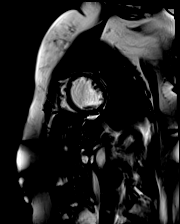
[im 190/350]
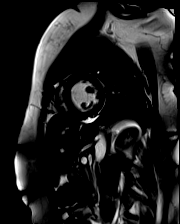
[im 204/350]
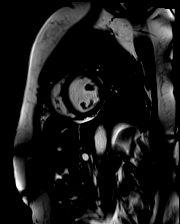
[im 219/350]
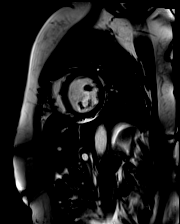
[im 233/350]
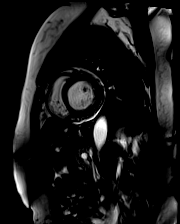
[im 248/350]
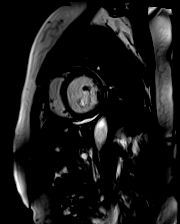
[im 262/350]
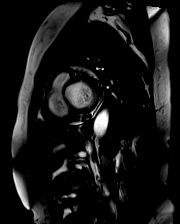
[im 277/350]
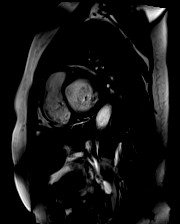
[im 291/350]
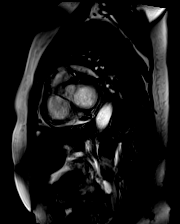
[im 306/350]
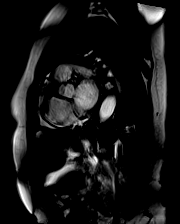
[im 320/350]
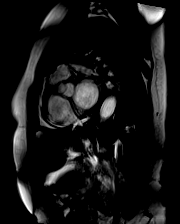
[im 335/350]
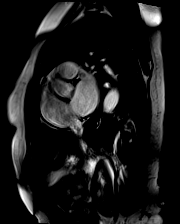
[im 350/350]
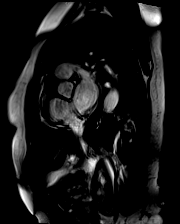

[Series 12: bSSFP · axial · 6.0mm · 1.25mm/px · 1 of 25 slices shown (2 of 4)]
[im 1/25]
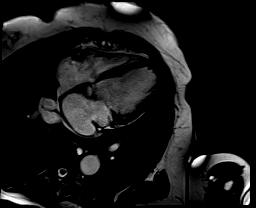

[Series 13: bSSFP · coronal · 6.0mm · 1.25mm/px · 1 of 25 slices shown (3 of 4)]
[im 1/25]
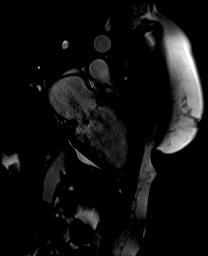

[Series 14: bSSFP · oblique · 6.0mm · 1.25mm/px · 1 of 25 slices shown (4 of 4)]
[im 1/25]
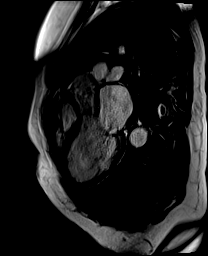

[Series 15: t2_stir_db_radial ((date)ch) · axial · 6.0mm · 1.63mm/px · 1 of 3 slices shown]
[im 1/3]
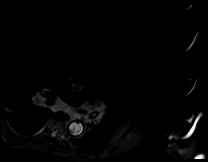

[Series 17: lge_single shot radial_mag · axial · 6.0mm · 1.98mm/px · 1 of 1 slices shown]
[im 1/1]
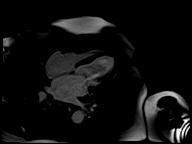

[Series 18: lge_single shot radial_psir · axial · 6.0mm · 1.98mm/px · 1 of 1 slices shown]
[im 1/1]
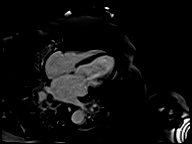

[Series 23: lge_single shot sa · oblique · 8.0mm · 1.98mm/px · 1 of 14 slices shown (1 of 2)]
[im 1/14]
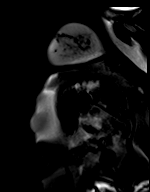

[Series 24: lge_single shot sa · oblique · 8.0mm · 1.98mm/px · 1 of 14 slices shown (2 of 2)]
[im 1/14]
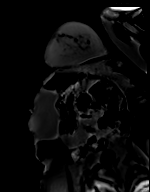

[Series 26: lge short axis_mag · oblique · 6.0mm · 1.61mm/px · 1 of 19 slices shown]
[im 1/19]
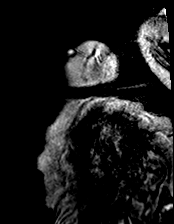

[Series 27: lge short axis_psir · oblique · 6.0mm · 1.61mm/px · 1 of 19 slices shown]
[im 1/19]
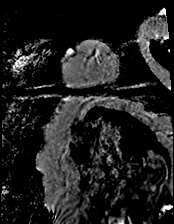

[Series 28: lge radial ((date)ch)_mag · axial · 6.0mm · 1.43mm/px · 1 of 1 slices shown]
[im 1/1]
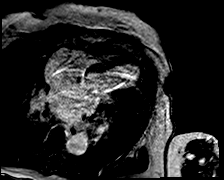

[Series 29: lge radial ((date)ch)_psir · axial · 6.0mm · 1.43mm/px · 1 of 1 slices shown]
[im 1/1]
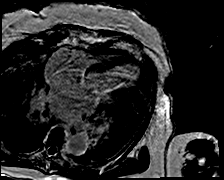

[38 of 40 positions shown; findings below may reference images not displayed]

FINDINGS: Hilar lymphadenopathy noted.

Small pericardial effusion. Mildly dilated left ventricle with
normal wall thickness. There was mild diffuse hypokinesis with mid
inferior severe hypokinesis, EF 45%. Normal right ventricular size
and systolic function, EF 51%. Moderate left atrial enlargement.
Mild right atrial enlargement. Trileaflet aortic valve with trivial
aortic insufficiency, no significant stenosis. Visually, severe
mitral regurgitation. Unfortunately, flow sequences to confirm were
not done. Mild tricuspid regurgitation.

On delayed enhancement imaging, there was possible very subtle
mid-wall late gadolinium enhancement in the mid inferior wall. It
was only seen on short axis images and not confirmed on long axis
images.

Measurements:

LVEDV 160 mL

LVSV 87 mL

LVEF 45%

RVEDV 84 mL
RVSV 43 mL

RVEF 51%
IMPRESSION: 1.  Hilar lymphadenopathy consistent with sarcoidosis diagnosis.

2. Mildly dilated LV with EF 45%, there was mild diffuse hypokinesis
with severe mid inferior hypokinesis.

3.  Normal RV size and systolic function, EF 51%.

4. Mitral regurgitation appeared severe (flow sequences to quantify
were not done).

5. Possible subtle mid-wall LGE in the mid inferior wall (not seen
on long axis views). This could be consistent with cardiac
sarcoidosis. This is not consistent with prior MI.

JUMPER

## 2019-04-24 MED ORDER — GADOBUTROL 1 MMOL/ML IV SOLN
7.5000 mL | Freq: Once | INTRAVENOUS | Status: AC | PRN
  Administered 2019-04-24: 7.5 mL via INTRAVENOUS

## 2019-04-25 ENCOUNTER — Telehealth (HOSPITAL_COMMUNITY): Payer: Self-pay | Admitting: Vascular Surgery

## 2019-04-25 NOTE — Telephone Encounter (Signed)
Left pt second message to reschedule 8/26 appt w/ db , asked pt to call back to reschedule for later date

## 2019-04-26 ENCOUNTER — Encounter (HOSPITAL_COMMUNITY): Payer: Self-pay | Admitting: Internal Medicine

## 2019-04-26 ENCOUNTER — Encounter: Payer: Self-pay | Admitting: *Deleted

## 2019-04-26 ENCOUNTER — Telehealth: Payer: Self-pay | Admitting: *Deleted

## 2019-04-26 NOTE — Telephone Encounter (Signed)
This encounter was created in error - please disregard.

## 2019-04-26 NOTE — Telephone Encounter (Signed)
Patient notified her appointment has been cancelled for 9/1 at 9 am. Spoke to spouse Lurena Joiner).

## 2019-05-02 ENCOUNTER — Ambulatory Visit: Payer: Self-pay | Admitting: Cardiology

## 2019-05-15 ENCOUNTER — Other Ambulatory Visit (HOSPITAL_COMMUNITY): Payer: Self-pay | Admitting: *Deleted

## 2019-05-15 MED ORDER — FUROSEMIDE 20 MG PO TABS: 20.0000 mg | ORAL_TABLET | ORAL | 2 refills | Status: DC | PRN

## 2019-05-15 MED ORDER — ATORVASTATIN CALCIUM 20 MG PO TABS: 20.0000 mg | ORAL_TABLET | Freq: Every day | ORAL | 3 refills | Status: DC

## 2019-05-15 MED ORDER — METOPROLOL SUCCINATE ER 50 MG PO TB24: 50.0000 mg | ORAL_TABLET | Freq: Every day | ORAL | 3 refills | Status: DC

## 2019-05-17 ENCOUNTER — Other Ambulatory Visit (HOSPITAL_COMMUNITY): Payer: Self-pay | Admitting: *Deleted

## 2019-05-17 MED ORDER — METOPROLOL SUCCINATE ER 50 MG PO TB24: 50.0000 mg | ORAL_TABLET | Freq: Every day | ORAL | 6 refills | Status: DC

## 2019-05-17 MED ORDER — SACUBITRIL-VALSARTAN 24-26 MG PO TABS: 1.0000 | ORAL_TABLET | Freq: Two times a day (BID) | ORAL | 6 refills | Status: DC

## 2019-05-17 MED ORDER — ATORVASTATIN CALCIUM 20 MG PO TABS: 20.0000 mg | ORAL_TABLET | Freq: Every day | ORAL | 6 refills | Status: DC

## 2019-05-31 ENCOUNTER — Other Ambulatory Visit: Payer: Self-pay

## 2019-05-31 ENCOUNTER — Ambulatory Visit (HOSPITAL_COMMUNITY)
Admission: RE | Admit: 2019-05-31 | Discharge: 2019-05-31 | Disposition: A | Discharge status: Home / Self Care | Payer: Medicare Other | Source: Ambulatory Visit | Attending: Internal Medicine | Admitting: Internal Medicine

## 2019-05-31 ENCOUNTER — Encounter (HOSPITAL_COMMUNITY): Payer: Self-pay | Admitting: Internal Medicine

## 2019-05-31 VITALS — BP 130/65 | HR 78 | Wt 142.8 lb

## 2019-05-31 DIAGNOSIS — D869 Sarcoidosis, unspecified: Secondary | ICD-10-CM | POA: Diagnosis not present

## 2019-05-31 DIAGNOSIS — H4051X0 Glaucoma secondary to other eye disorders, right eye, stage unspecified: Secondary | ICD-10-CM | POA: Insufficient documentation

## 2019-05-31 DIAGNOSIS — Z91018 Allergy to other foods: Secondary | ICD-10-CM | POA: Insufficient documentation

## 2019-05-31 DIAGNOSIS — I340 Nonrheumatic mitral (valve) insufficiency: Secondary | ICD-10-CM | POA: Diagnosis not present

## 2019-05-31 DIAGNOSIS — I493 Ventricular premature depolarization: Secondary | ICD-10-CM | POA: Diagnosis not present

## 2019-05-31 DIAGNOSIS — Z79899 Other long term (current) drug therapy: Secondary | ICD-10-CM | POA: Insufficient documentation

## 2019-05-31 DIAGNOSIS — Z832 Family history of diseases of the blood and blood-forming organs and certain disorders involving the immune mechanism: Secondary | ICD-10-CM | POA: Diagnosis not present

## 2019-05-31 DIAGNOSIS — H547 Unspecified visual loss: Secondary | ICD-10-CM | POA: Insufficient documentation

## 2019-05-31 DIAGNOSIS — I5022 Chronic systolic (congestive) heart failure: Secondary | ICD-10-CM | POA: Diagnosis present

## 2019-05-31 DIAGNOSIS — I5041 Acute combined systolic (congestive) and diastolic (congestive) heart failure: Secondary | ICD-10-CM | POA: Diagnosis not present

## 2019-05-31 DIAGNOSIS — I471 Supraventricular tachycardia: Secondary | ICD-10-CM | POA: Diagnosis not present

## 2019-05-31 DIAGNOSIS — Z8249 Family history of ischemic heart disease and other diseases of the circulatory system: Secondary | ICD-10-CM | POA: Diagnosis not present

## 2019-05-31 DIAGNOSIS — I110 Hypertensive heart disease with heart failure: Secondary | ICD-10-CM | POA: Insufficient documentation

## 2019-05-31 LAB — BASIC METABOLIC PANEL
Anion gap: 9 (ref 5–15)
BUN: 9 mg/dL (ref 8–23)
CO2: 25 mmol/L (ref 22–32)
Calcium: 8.8 mg/dL — ABNORMAL LOW (ref 8.9–10.3)
Chloride: 103 mmol/L (ref 98–111)
Creatinine, Ser: 0.78 mg/dL (ref 0.44–1.00)
GFR calc Af Amer: >60 mL/min (ref >60)
GFR calc non Af Amer: >60 mL/min (ref >60)
Glucose, Bld: 131 mg/dL — ABNORMAL HIGH (ref 70–99)
Potassium: 3.7 mmol/L (ref 3.5–5.1)
Sodium: 137 mmol/L (ref 135–145)

## 2019-05-31 LAB — MAGNESIUM: Magnesium: 1.9 mg/dL (ref 1.7–2.4)

## 2019-05-31 LAB — BRAIN NATRIURETIC PEPTIDE: B Natriuretic Peptide: 292.6 pg/mL — ABNORMAL HIGH (ref 0.0–100.0)

## 2019-05-31 MED ORDER — ENTRESTO 49-51 MG PO TABS: 1.0000 | ORAL_TABLET | Freq: Two times a day (BID) | ORAL | 3 refills | Status: DC

## 2019-05-31 NOTE — Progress Notes (Signed)
Zio patch placed onto patient by Irine Seal,  Shelley All instructions and information reviewed with patient, they verbalize understanding with no questions.

## 2019-05-31 NOTE — Progress Notes (Signed)
ADVANCED HF CLINIC NOTE  Referring Physician: Dr Johnsie Cancel  Primary Care: Primary Cardiologist:Dr Johnsie Cancel   HPI: Ms Robin Estes is a 73 year old referred to the HF clinic by Dr Johnsie Cancel for HF consultation.   73 year old with a history of interstitial cystitis, sarcoidosis (eye, lung, and skin), osteopenia, MAT, HTN,MR, pulmonary nodules on CT 03/2019, and recently diagnosed systolic heart failure. Followed by dermatology for sarcoid.   In July 2020 she was admitted to [facility] with increased shortness breath. EKG showed A fib/MAT. Started on diltiazem drip and later swtiched to toprol xl. ECHO showed reduced EF 30-35%  and mod-severe MR. Diuresed with IV lasix and transitioned to po.  Started on HF medications.   Here for routine f/u. Here with her daughter. Feels good. Can do all ADLs. Can walk in the park without too much problem. No CP or SOB. No edema. No palpitations. + dry cough. Weight stable 139-141  ECHO 03/2019-  30-35% , RV normal, MV mod-severe MR.   Myoview 03/2019: no ischemia, moderate LV dysfunction 30%   cMRI 04/24/19  1.  Hilar lymphadenopathy consistent with sarcoidosis diagnosis.  2. Mildly dilated LV with EF 45%, there was mild diffuse hypokinesis with severe mid inferior hypokinesis.  3.  Normal RV size and systolic function, EF 16%.  4. Mitral regurgitation appeared severe (flow sequences to quantify were not done).  5. Possible subtle mid-wall LGE in the mid inferior wall (not seen on long axis views). This could be consistent with cardiac sarcoidosis. This is not consistent with prior MI.  Past Medical History:  Diagnosis Date  . Bladder spasms   . Chronic interstitial cystitis   . Dysuria   . End-stage glaucoma    RIGHT EYE  . Frequency of urination   . Legally blind in right eye, as defined in [place]    SECONDARY TO GLAUCOMA  . Nocturia   . Sarcoidosis of skin   . Sensation of pressure in bladder area   . Urgency of urination     Current Outpatient  Medications  Medication Sig Dispense Refill  . atorvastatin (LIPITOR) 20 MG tablet Take 1 tablet (20 mg total) by mouth daily at 6 PM. 30 tablet 6  . furosemide (LASIX) 20 MG tablet Take 1 tablet (20 mg total) by mouth as needed. (Patient taking differently: Take 20 mg by mouth every other day. ) 30 tablet 2  . guaiFENesin (MUCINEX) 600 MG 12 hr tablet Take 1 tablet (600 mg total) by mouth 2 (two) times daily. (Patient taking differently: Take 600 mg by mouth daily. ) 10 tablet 0  . metoprolol succinate (TOPROL-XL) 50 MG 24 hr tablet Take 1 tablet (50 mg total) by mouth daily. 30 tablet 6  . sacubitril-valsartan (ENTRESTO) 24-26 MG Take 1 tablet by mouth 2 (two) times daily. 60 tablet 6  . spironolactone (ALDACTONE) 25 MG tablet Take 0.5 tablets (12.5 mg total) by mouth daily. 30 tablet 0   No current facility-administered medications for this encounter.    Facility-Administered Medications Ordered in Other Encounters  Medication Dose Route Frequency Provider Last Rate Last Dose  . bupivacaine (MARCAINE) 0.5 % 10 mL, triamcinolone acetonide (KENALOG-40) 40 mg injection   Subcutaneous Once Carolan Clines, MD      . bupivacaine (MARCAINE) 0.5 % 15 mL, phenazopyridine (PYRIDIUM) 400 mg bladder mixture   Bladder Instillation Once Carolan Clines, MD        Allergies  Allergen Reactions  . Cinnamon Other (See Comments)  MOUTH ULCERS      Social History   Socioeconomic History  . Marital status: Married    Spouse name: Not on file  . Number of children: Not on file  . Years of education: Not on file  . Highest education level: Not on file  Occupational History  . Not on file  Social Needs  . Financial resource strain: Not on file  . Food insecurity    Worry: Not on file    Inability: Not on file  . Transportation needs    Medical: Not on file    Non-medical: Not on file  Tobacco Use  . Smoking status: Never Smoker  . Smokeless tobacco: Never Used  Substance and  Sexual Activity  . Alcohol use: No  . Drug use: No  . Sexual activity: Not on file  Lifestyle  . Physical activity    Days per week: Not on file    Minutes per session: Not on file  . Stress: Not on file  Relationships  . Social Musician on phone: Not on file    Gets together: Not on file    Attends religious service: Not on file    Active member of club or organization: Not on file    Attends meetings of clubs or organizations: Not on file    Relationship status: Not on file  . Intimate partner violence    Fear of current or ex partner: Not on file    Emotionally abused: Not on file    Physically abused: Not on file    Forced sexual activity: Not on file  Other Topics Concern  . Not on file  Social History Narrative  . Not on file      Family History  Problem Relation Age of Onset  . CAD Father   . Sarcoidosis Other     Vitals:   05/31/19 1411  BP: 130/65  Pulse: 78  SpO2: 96%  Weight: 64.8 kg (142 lb 12.8 oz)    PHYSICAL EXAM: General:  Well appearing. No resp difficulty HEENT: normal with loss of pigmentation at hairline Neck: supple. no JVD. Carotids 2+ bilat; no bruits. No lymphadenopathy or thryomegaly appreciated. Cor: PMI nondisplaced. Regular rate & rhythm. 2/6 MR Lungs: clear Abdomen: soft, nontender, nondistended. No hepatosplenomegaly. No bruits or masses. Good bowel sounds. Extremities: no cyanosis, clubbing, rash, edema + erythema nodusum lesion on left shin  Neuro: alert & orientedx3, cranial nerves grossly intact. moves all 4 extremities w/o difficulty. Affect pleasant    ECG 03/23/19: SR PACS 88 bpm Personally reviewed   ASSESSMENT & PLAN:  1. Chronic Systolic Heart Failure - ECHO 01/5992 EF 30-35% . Myoview - no ischemia. 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 RV normal. Minimal LGE -> doubt significant cardiac sarcoid - Much improved NYHA II-III - volume status looks good.  - Continue Toprol xl 50 mg daily.   - Increase entresto to 49/51 mg twice a day.  - Continue 12.5 mg spironolactone daily.  - Check BMET   2. MAT - On toprol Xl 50 mg daily . Today she is in NSR. Continue current regimen.  - Will place Zio patch x 3 days to evaluate ectopy burden as potential cause of CM  3. Severe mitral regurgitaion - echo removed. This is central MR so suspect function but LV not that big. MV is thickened but otherwise seems pretty normal - will repeat echo in 2 months and see  if MR improves with treatment of cardiomyopathy. If not, will plan TEE to get better look at MV  4. Sarcoidosis  - Biopsy proven by Dermatology - Needs Pulmonary f/u. I called and d/w Dr. Mechele Collin in Pulmonary who will see her soon    Arvilla Meres, MD  2:53 PM

## 2019-05-31 NOTE — Patient Instructions (Signed)
Routine lab work today. Will notify you of abnormal results  Increase Entresto to 49/51mg  twice daily.  Your provider requests you wear a zio patch for 3 days  (please see zio instructions)  You have been referred to Rodman Pickle with Pulmonary.   Follow up in 2 months with an echo.

## 2019-07-04 ENCOUNTER — Ambulatory Visit (INDEPENDENT_AMBULATORY_CARE_PROVIDER_SITE_OTHER): Payer: BC Managed Care – PPO | Admitting: Pulmonary Disease

## 2019-07-04 ENCOUNTER — Other Ambulatory Visit: Payer: Self-pay

## 2019-07-04 ENCOUNTER — Encounter: Payer: Self-pay | Admitting: Pulmonary Disease

## 2019-07-04 VITALS — BP 122/76 | HR 73 | Temp 98.7°F | Ht 59.0 in | Wt 147.1 lb

## 2019-07-04 DIAGNOSIS — R911 Solitary pulmonary nodule: Secondary | ICD-10-CM | POA: Diagnosis not present

## 2019-07-04 DIAGNOSIS — D869 Sarcoidosis, unspecified: Secondary | ICD-10-CM | POA: Diagnosis not present

## 2019-07-04 DIAGNOSIS — D860 Sarcoidosis of lung: Secondary | ICD-10-CM

## 2019-07-04 DIAGNOSIS — D863 Sarcoidosis of skin: Secondary | ICD-10-CM | POA: Diagnosis not present

## 2019-07-04 NOTE — Patient Instructions (Addendum)
Pulmonary sarcoidosis - Will arrange pulmonary function tests - Recommend annual OPTHALMOLOGY exam - Obtain labwork for BMP and quantiferon-GOLD (Tuberculosis test) - Please let us know which dermatology office the biopsy was done so we can request results  Right upper lobe lung nodule - Obtain CT Chest with contrast  Follow-up after CT Chest

## 2019-07-04 NOTE — Progress Notes (Signed)
Subjective:   PATIENT ID: Robin Estes GENDER: female DOB: [birth year], MRN: [id number]   HPI  Chief Complaint  Patient presents with  . Consult    sarcoidosis-abnormal imaging    Reason for Visit: New consult for sarcoid  73 year old female never smoker with sarcoidosis diagnosed via scalp biopsy who presents as a referral from Cardiology for abnormal chest imaging. Daughter is presents and provides history as noted below.  She is a patient of Dr. Gala Romney who follows her for heart failure and atrial fibrillation/MAT. She was referred to our clinic for pulmonary nodules and general sarcoid management. She reports she was diagnosed with sarcoidosis via skin biopsy many years ago. She had multiple red lesions along her scalp and her legs and confirmed diagnosis after biopsy. She also reports long history of eye issues. Patient sees an optometrist but not an opthalmologist. She has not mentioned her history of sarcoid to her eye doctor before and is seen for routine vision checks. From a respiratory standpoint, she has an occasional cough that can be productive around this time of the year but denies long-standing issues with breathing, cough, wheezing or chest pain.  I have personally reviewed patient's past medical/family/social history, allergies, current medications.  Past Medical History:  Diagnosis Date  . Bladder spasms   . Chronic interstitial cystitis   . Dysuria   . End-stage glaucoma    RIGHT EYE  . Frequency of urination   . Legally blind in right eye, as defined in [place]    SECONDARY TO GLAUCOMA  . Nocturia   . Sarcoidosis of skin   . Sensation of pressure in bladder area   . Urgency of urination      Family History  Problem Relation Age of Onset  . CAD Father   . Sarcoidosis Other      Social History   Occupational History  . Not on file  Tobacco Use  . Smoking status: Never Smoker  . Smokeless tobacco: Never Used  Substance and Sexual Activity  .  Alcohol use: No  . Drug use: No  . Sexual activity: Not on file    Allergies  Allergen Reactions  . Cinnamon Other (See Comments)    MOUTH ULCERS     Outpatient Medications Prior to Visit  Medication Sig Dispense Refill  . atorvastatin (LIPITOR) 20 MG tablet Take 1 tablet (20 mg total) by mouth daily at 6 PM. 30 tablet 6  . furosemide (LASIX) 20 MG tablet Take 1 tablet (20 mg total) by mouth as needed. (Patient taking differently: Take 20 mg by mouth every other day. ) 30 tablet 2  . guaiFENesin (MUCINEX) 600 MG 12 hr tablet Take 1 tablet (600 mg total) by mouth 2 (two) times daily. (Patient taking differently: Take 600 mg by mouth daily. ) 10 tablet 0  . metoprolol succinate (TOPROL-XL) 50 MG 24 hr tablet Take 1 tablet (50 mg total) by mouth daily. 30 tablet 6  . sacubitril-valsartan (ENTRESTO) 49-51 MG Take 1 tablet by mouth 2 (two) times daily. 60 tablet 3  . spironolactone (ALDACTONE) 25 MG tablet Take 0.5 tablets (12.5 mg total) by mouth daily. 30 tablet 0   Facility-Administered Medications Prior to Visit  Medication Dose Route Frequency Provider Last Rate Last Dose  . bupivacaine (MARCAINE) 0.5 % 10 mL, triamcinolone acetonide (KENALOG-40) 40 mg injection   Subcutaneous Once Jethro Bolus, MD      . bupivacaine (MARCAINE) 0.5 % 15 mL, phenazopyridine (PYRIDIUM) 400 mg  bladder mixture   Bladder Instillation Once Carolan Clines, MD        Review of Systems  Constitutional: Negative for chills, diaphoresis, fever, malaise/fatigue and weight loss.  HENT: Positive for congestion and sore throat. Negative for ear pain.   Respiratory: Positive for cough and sputum production. Negative for hemoptysis, shortness of breath and wheezing.   Cardiovascular: Positive for palpitations. Negative for chest pain and leg swelling.  Gastrointestinal: Negative for abdominal pain, heartburn and nausea.  Genitourinary: Positive for frequency.  Musculoskeletal: Negative for joint pain and  myalgias.  Skin: Negative for itching and rash.  Neurological: Negative for dizziness, weakness and headaches.  Endo/Heme/Allergies: Does not bruise/bleed easily.  Psychiatric/Behavioral: Negative for depression. The patient is not nervous/anxious.      Objective:   Vitals:   07/04/19 1617 07/04/19 1618  BP:  122/76  Pulse:  73  Temp: 98.7 F (37.1 C)   TempSrc: Temporal   SpO2:  98%  Weight: 147 lb 1.6 oz (66.7 kg)   Height: 4\' 11"  (1.499 m)      Physical Exam: General: Well-appearing, no acute distress HENT: [place], AT, OP clear, MMM Eyes: Right eye blindness, EOMI, no scleral icterus Respiratory: Clear to auscultation bilaterally.  No crackles, wheezing or rales Cardiovascular: RRR, -M/R/G, no JVD GI: BS+, soft, nontender Extremities:-Edema,-tenderness Neuro: AAO x4, CNII-XII grossly intact Skin: Multiple mildly erythematous lesions with flaking near the anterior and posterior scalpline, hyperpigmented lesions on the anterior leg bilaterally Psych: Normal mood, normal affect  Data Reviewed:  Imaging: CTA 03/15/19 - Bulky bilateral hilar and mediastinal adenopathy, multiple bilateral pulmonary nodules with the larges ~1mm in RUL, small bilateral pleural effusion  PFT: None on file  Labs: CBC    Component Value Date/Time   WBC 5.1 03/18/2019 0343   RBC 3.98 03/18/2019 0343   HGB 11.1 (L) 03/18/2019 0343   HCT 37.4 03/18/2019 0343   PLT 302 03/18/2019 0343   MCV 94.0 03/18/2019 0343   MCH 27.9 03/18/2019 0343   MCHC 29.7 (L) 03/18/2019 0343   RDW 14.4 03/18/2019 0343   LYMPHSABS 0.8 03/18/2019 0343   MONOABS 0.5 03/18/2019 0343   EOSABS 0.2 03/18/2019 0343   BASOSABS 0.0 03/18/2019 0343   CMP Latest Ref Rng & Units 07/04/2019 05/31/2019 03/23/2019  Glucose 70 - 99 mg/dL 108(H) 131(H) 118(H)  BUN 6 - 23 mg/dL 10 9 14   Creatinine 0.40 - 1.20 mg/dL 0.75 0.78 0.99  Sodium 135 - 145 mEq/L 141 137 135  Potassium 3.5 - 5.1 mEq/L 3.4(L) 3.7 4.1  Chloride 96 - 112  mEq/L 103 103 102  CO2 19 - 32 mEq/L 32 25 22  Calcium 8.4 - 10.5 mg/dL 9.1 8.8(L) 9.0  Total Protein 6.5 - 8.1 g/dL - - -  Total Bilirubin 0.3 - 1.2 mg/dL - - -  Alkaline Phos 38 - 126 U/L - - -  AST 15 - 41 U/L - - -  ALT 0 - 44 U/L - - -    Imaging, labs and tests noted above have been reviewed independently by me.    Assessment & Plan:   Discussion: 73 year old female with sarcoidosis with pulmonary (stage I radiographic findings) and skin involvement and ?eye involvement who presents with multiple pulmonary nodules. She denies chronic respiratory symptoms at this time however would benefit from baseline pulmonary function tests. We spent time discussing the progressive course of sarcoidosis and recommended clinical monitoring including with labs, PFTs and eye exams.  We also discussed  her pulmonary nodules which due to the size of the RUL lung nodule, will require repeat chest imaging to ensure stability of the lesion.  Pulmonary sarcoidosis with extrapulmonary manifestations - Dx via skin biopsy. Requested daughter to bring in records - No indication for prednisone therapy at this time - Will obtain PFTs and obtain annually - Recommend annual ophthalmology exam. Has only seen optometry - Cardiac MRI 04/24/19 no sarcoid involvement per Cardiology - Continue to keep Dermatology follow-up for skin lesion management - QuantiFERON-TB negative - Will obtain CBC, CMP and urinary calcium at next three month interval   Right upper lobe lung nodule - Obtain CT Chest with contrast  Health Maintenance Immunization History  Administered Date(s) Administered  . Influenza,inj,Quad PF,6+ Mos 07/04/2019  . Pneumococcal Conjugate-13 02/12/2015, 02/12/2015  . Pneumococcal Polysaccharide-23 02/05/2010, 02/05/2010  . Tdap 02/25/2007    Orders Placed This Encounter  Procedures  . CT Chest W Contrast    After 07/16/2019 in the afternoon after 2:30pm .    Standing Status:   Future     Standing Expiration Date:   09/02/2020    Order Specific Question:   If indicated for the ordered procedure, I authorize the administration of contrast media per Radiology protocol    Answer:   Yes    Order Specific Question:   Preferred imaging location?    Answer:   [facility] Specific Question:   Radiology Contrast Protocol - do NOT remove file path    Answer:   [email or web address]  . Basic Metabolic Panel (BMET)    Standing Status:   Future    Number of Occurrences:   1    Standing Expiration Date:   07/03/2020  . QuantiFERON-TB Gold Plus    Standing Status:   Future    Number of Occurrences:   1    Standing Expiration Date:   07/03/2020  No orders of the defined types were placed in this encounter.   Return in about 3 months (around 10/04/2019).  Enrika Aguado Mechele Collin, MD [facility] 07/04/2019 9:45 AM  Office Number [phone]

## 2019-07-05 LAB — BASIC METABOLIC PANEL
BUN: 10 mg/dL (ref 6–23)
CO2: 32 mEq/L (ref 19–32)
Calcium: 9.1 mg/dL (ref 8.4–10.5)
Chloride: 103 mEq/L (ref 96–112)
Creatinine, Ser: 0.75 mg/dL (ref 0.40–1.20)
GFR: 91.28 mL/min (ref >60.00)
Glucose, Bld: 108 mg/dL — ABNORMAL HIGH (ref 70–99)
Potassium: 3.4 mEq/L — ABNORMAL LOW (ref 3.5–5.1)
Sodium: 141 mEq/L (ref 135–145)

## 2019-07-06 NOTE — Addendum Note (Signed)
Encounter addended by: Micki Riley, RN on: 07/06/2019 10:47 AM  Actions taken: Imaging Exam ended

## 2019-07-07 LAB — QUANTIFERON-TB GOLD PLUS
Mitogen-NIL: >10 IU/mL
NIL: 0.08 IU/mL
QuantiFERON-TB Gold Plus: NEGATIVE
TB1-NIL: 0 IU/mL
TB2-NIL: 0 IU/mL

## 2019-07-09 DIAGNOSIS — R911 Solitary pulmonary nodule: Secondary | ICD-10-CM

## 2019-07-09 DIAGNOSIS — D860 Sarcoidosis of lung: Secondary | ICD-10-CM | POA: Insufficient documentation

## 2019-07-09 DIAGNOSIS — D863 Sarcoidosis of skin: Secondary | ICD-10-CM | POA: Insufficient documentation

## 2019-07-09 HISTORY — DX: Solitary pulmonary nodule: R91.1

## 2019-07-09 HISTORY — DX: Sarcoidosis of lung: D86.0

## 2019-07-10 ENCOUNTER — Other Ambulatory Visit (HOSPITAL_COMMUNITY): Payer: Self-pay

## 2019-07-10 MED ORDER — SPIRONOLACTONE 25 MG PO TABS: 12.5000 mg | ORAL_TABLET | Freq: Every day | ORAL | 0 refills | Status: DC

## 2019-07-17 ENCOUNTER — Ambulatory Visit (HOSPITAL_BASED_OUTPATIENT_CLINIC_OR_DEPARTMENT_OTHER): Admission: RE | Admit: 2019-07-17 | Payer: BC Managed Care – PPO | Source: Ambulatory Visit

## 2019-07-26 ENCOUNTER — Other Ambulatory Visit: Payer: Self-pay

## 2019-07-26 ENCOUNTER — Ambulatory Visit (HOSPITAL_BASED_OUTPATIENT_CLINIC_OR_DEPARTMENT_OTHER)
Admission: RE | Admit: 2019-07-26 | Discharge: 2019-07-26 | Disposition: A | Discharge status: Home / Self Care | Payer: BC Managed Care – PPO | Source: Ambulatory Visit | Attending: Pulmonary Disease | Admitting: Pulmonary Disease

## 2019-07-26 DIAGNOSIS — R911 Solitary pulmonary nodule: Secondary | ICD-10-CM | POA: Diagnosis present

## 2019-07-26 IMAGING — CT CT CHEST W/ CM
2 of 3 series · 15 of 36 positions shown, 18 images · IV contrast (Omnipaque)
Comparison: CT [DATE]

CLINICAL DATA: Follow-up pulmonary nodules. Short of breath.
Sarcoidosis.

EXAM:
CT CHEST WITH CONTRAST
TECHNIQUE: Multidetector CT imaging of the chest was performed during
intravenous contrast administration.
CONTRAST:  100mL OMNIPAQUE IOHEXOL 300 MG/ML  SOLN

[Series 2: axial st · axial · 0.66mm/px · z∈[+1192,+1424]mm · 12 of 138 slices shown, 15 images]
[im 11/138  mediastinal]
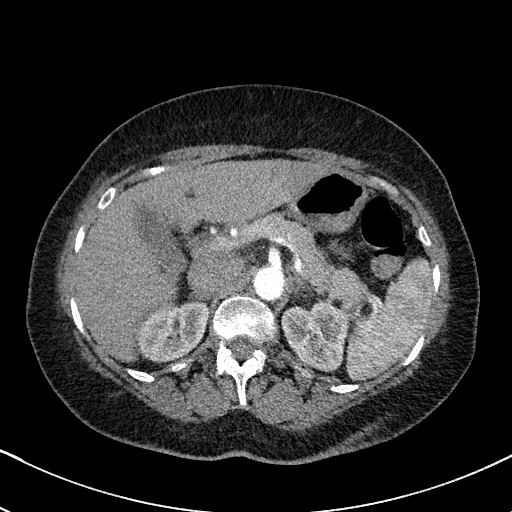
[im 11/138  lung]
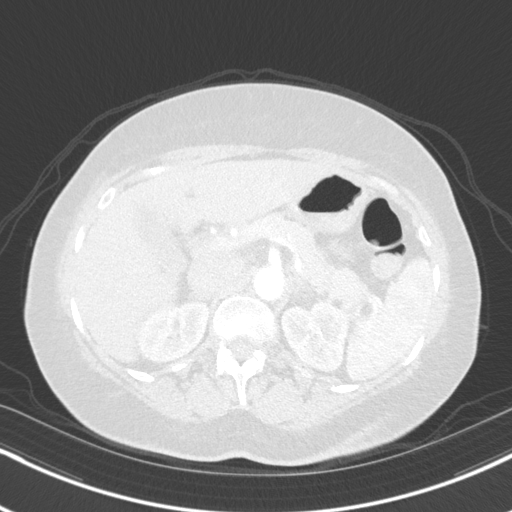
[im 21/138  lung]
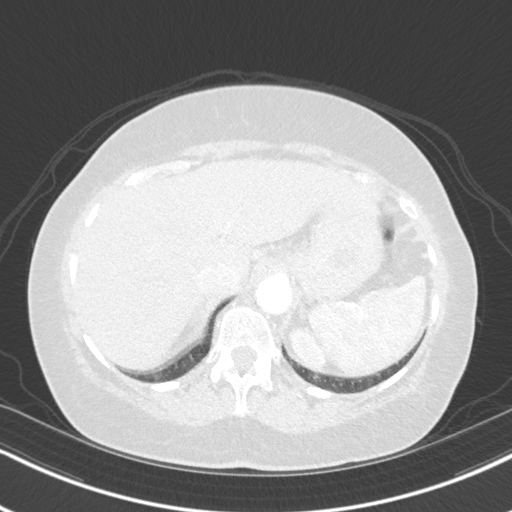
[im 31/138  lung]
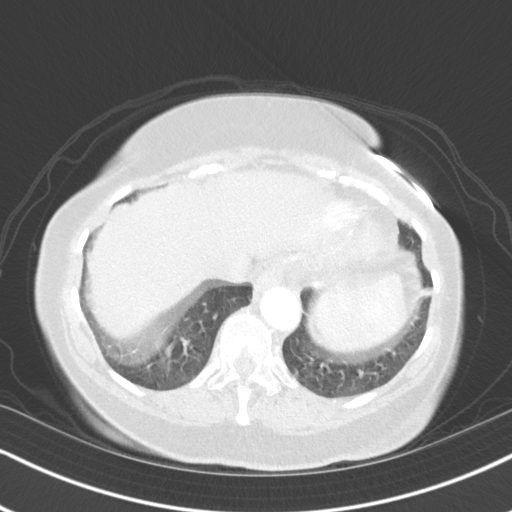
[im 41/138  lung]
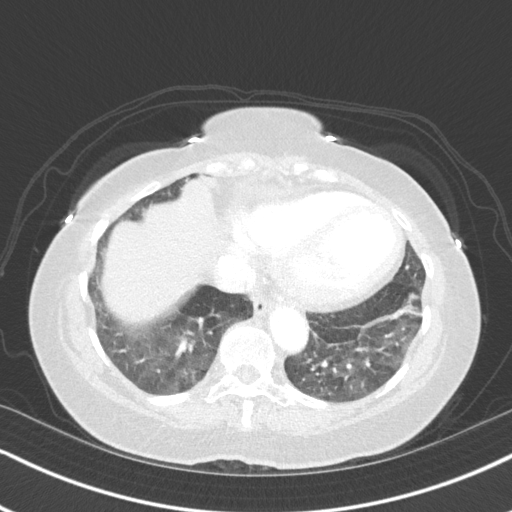
[im 51/138  mediastinal]
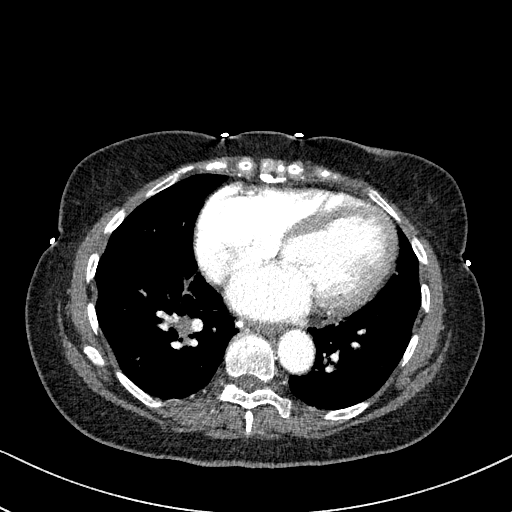
[im 51/138  lung]
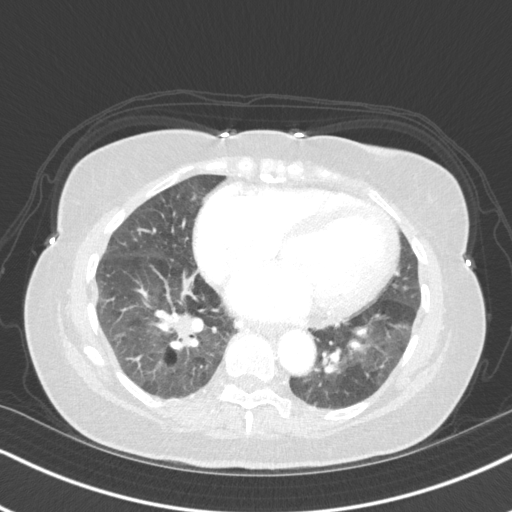
[im 61/138  lung]
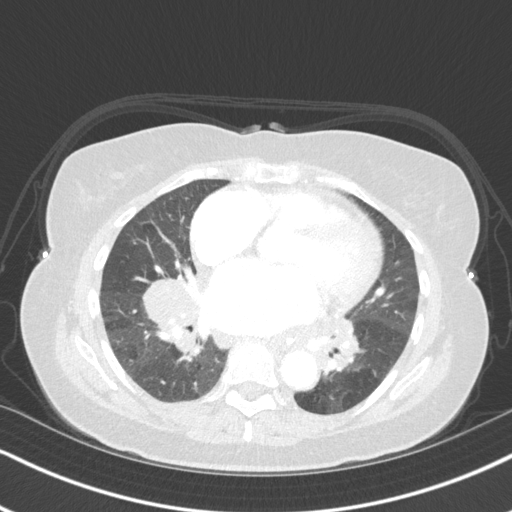
[im 77/138  lung]
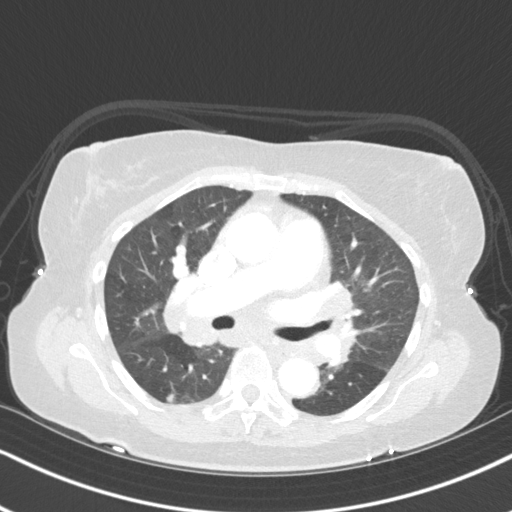
[im 87/138  lung]
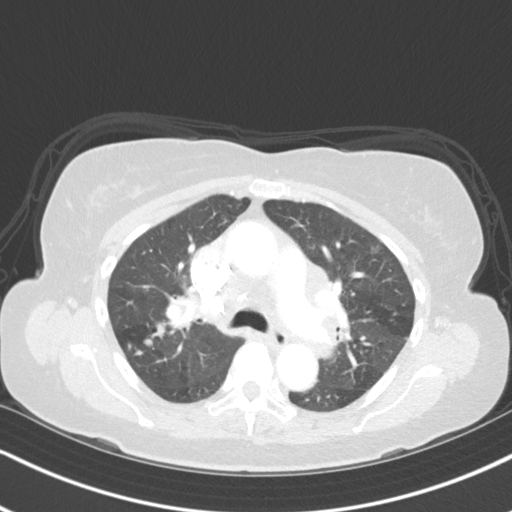
[im 97/138  mediastinal]
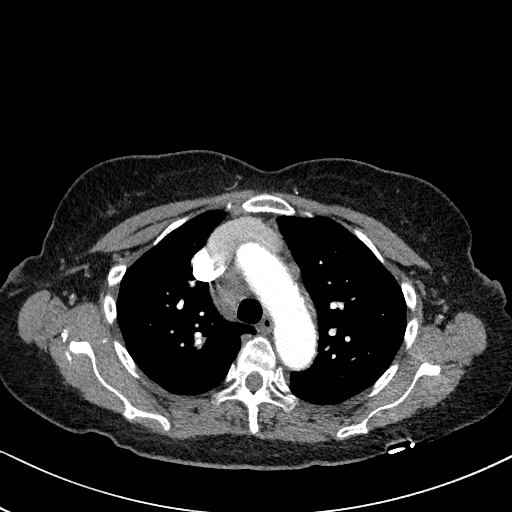
[im 97/138  lung]
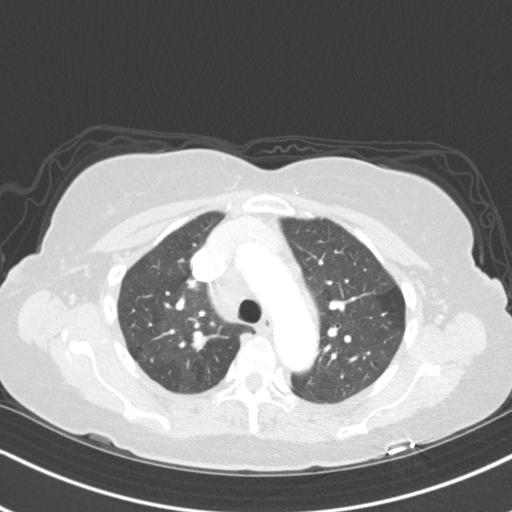
[im 107/138  lung]
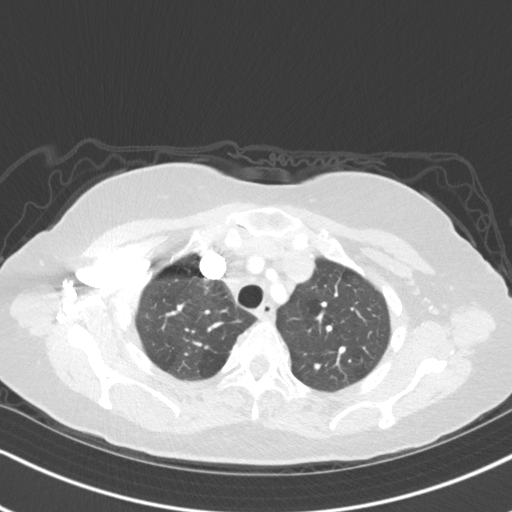
[im 117/138  lung]
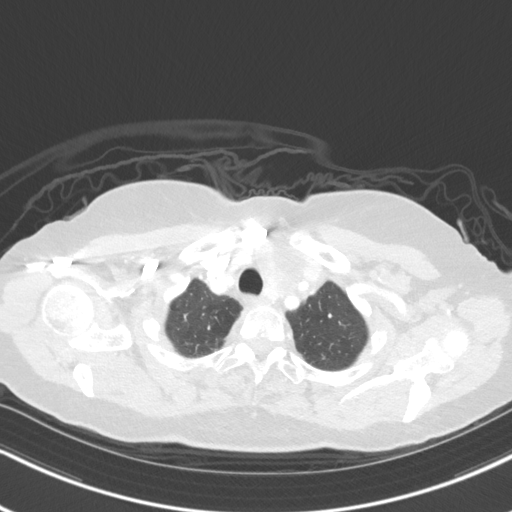
[im 127/138  lung]
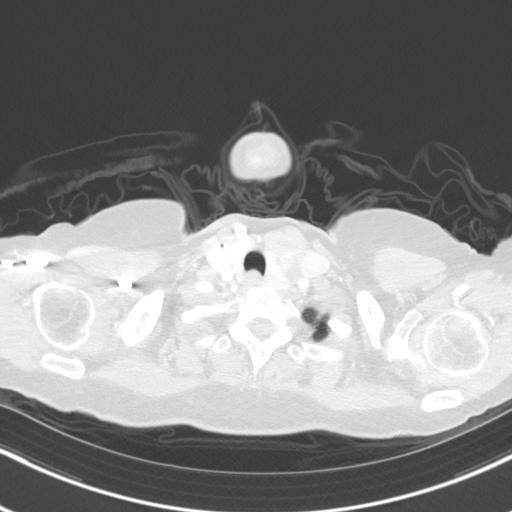

[Series 5: coronal · coronal · 0.59mm/px · 3 of 146 slices shown]
[im 30/146  lung]
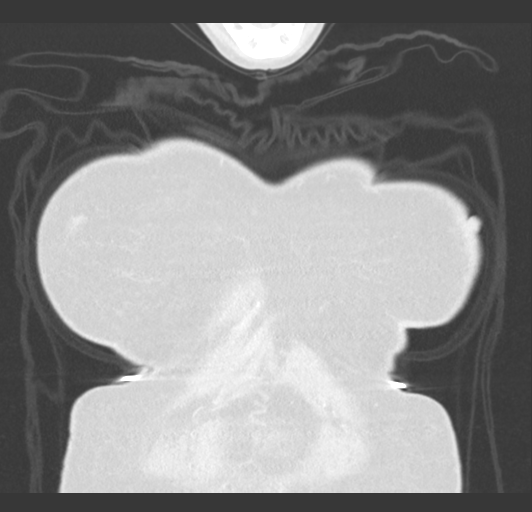
[im 59/146  lung]
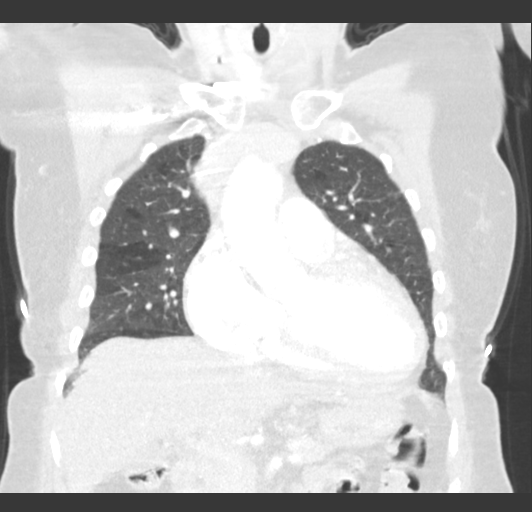
[im 88/146  lung]
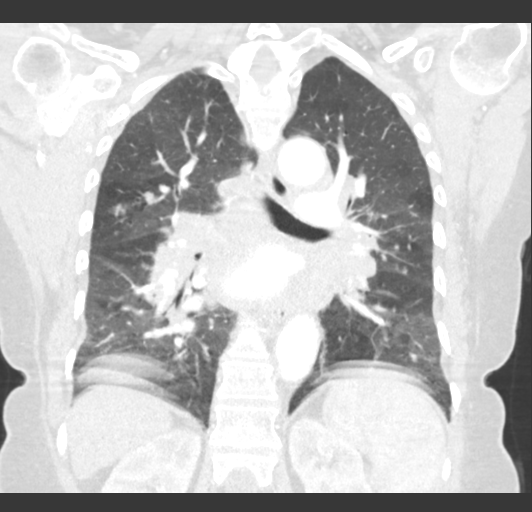

[15 of 36 positions shown; findings below may reference images not displayed]

FINDINGS: Cardiovascular: No filling defects within the pulmonary arteries to
suggest pulmonary embolism. Acute findings the aorta great vessels.

Mediastinum/Nodes: No axillary adenopathy. No supraclavicular
adenopathy. There is large mixed density enlargement of the LEFT
lobe of thyroid gland to 4.3 cm not changed from prior.

Bulky mediastinum and hilar lymphadenopathy again noted. The degree
of adenopathy is unchanged the interval from [DATE]. Some lymph
nodes have calcifications typical of sarcoidosis

Lungs/Pleura: Again demonstrated bilateral pulmonary nodules.
Nodules are not increased in size. There is a central peribronchial
vascular pattern to some of the nodules typical sarcoidosis.

Example nodule in the RIGHT upper lobe measures 9 mm (image 50/4)
compared with 9 mm. RIGHT upper lobe nodule on image 56/4 measures 7
mm compared with 7 mm. No new nodules identified.

Smaller scattered nodules are present in LEFT lung additionally.
Example subpleural nodule in LEFT upper lobe measuring 5 mm (image
53/4 is unchanged.

There is interval resolution of bilateral pleural effusions.

Upper Abdomen: Limited view of the liver, kidneys, pancreas are
unremarkable. Normal adrenal glands.

Musculoskeletal: No aggressive osseous lesion.
IMPRESSION: 1. No evidence of pulmonary embolism.
2. Stable bulky mediastinal and hilar lymphadenopathy consistent
with sarcoidosis.
3. Stable bilateral pulmonary nodules consistent with pulmonary
sarcoidosis.
4. Resolution of bilateral pleural effusions.
5. Stable large mixed density enlargement of the LEFT lobe of
thyroid gland. Consider thyroid ultrasound for further evaluation if
patient is clinically hyperthyroid, consider nuclear medicine
thyroid uptake and scan.

## 2019-07-26 MED ORDER — IOHEXOL 300 MG/ML  SOLN
100.0000 mL | Freq: Once | INTRAMUSCULAR | Status: AC | PRN
  Administered 2019-07-26: 100 mL via INTRAVENOUS

## 2019-08-07 ENCOUNTER — Other Ambulatory Visit: Payer: Self-pay

## 2019-08-07 ENCOUNTER — Ambulatory Visit (HOSPITAL_COMMUNITY)
Admission: RE | Admit: 2019-08-07 | Discharge: 2019-08-07 | Disposition: A | Discharge status: Home / Self Care | Payer: BC Managed Care – PPO | Source: Ambulatory Visit | Attending: Internal Medicine | Admitting: Internal Medicine

## 2019-08-07 ENCOUNTER — Encounter (HOSPITAL_COMMUNITY): Payer: Self-pay | Admitting: Internal Medicine

## 2019-08-07 ENCOUNTER — Ambulatory Visit (HOSPITAL_BASED_OUTPATIENT_CLINIC_OR_DEPARTMENT_OTHER)
Admission: RE | Admit: 2019-08-07 | Discharge: 2019-08-07 | Disposition: A | Discharge status: Home / Self Care | Payer: BC Managed Care – PPO | Source: Ambulatory Visit | Attending: Internal Medicine | Admitting: Internal Medicine

## 2019-08-07 VITALS — BP 150/70 | HR 87 | Wt 145.2 lb

## 2019-08-07 DIAGNOSIS — I083 Combined rheumatic disorders of mitral, aortic and tricuspid valves: Secondary | ICD-10-CM | POA: Diagnosis not present

## 2019-08-07 DIAGNOSIS — I340 Nonrheumatic mitral (valve) insufficiency: Secondary | ICD-10-CM | POA: Diagnosis not present

## 2019-08-07 DIAGNOSIS — I5040 Unspecified combined systolic (congestive) and diastolic (congestive) heart failure: Secondary | ICD-10-CM | POA: Diagnosis not present

## 2019-08-07 DIAGNOSIS — Z79899 Other long term (current) drug therapy: Secondary | ICD-10-CM | POA: Diagnosis not present

## 2019-08-07 DIAGNOSIS — R590 Localized enlarged lymph nodes: Secondary | ICD-10-CM | POA: Insufficient documentation

## 2019-08-07 DIAGNOSIS — I5041 Acute combined systolic (congestive) and diastolic (congestive) heart failure: Secondary | ICD-10-CM

## 2019-08-07 DIAGNOSIS — I5022 Chronic systolic (congestive) heart failure: Secondary | ICD-10-CM | POA: Diagnosis present

## 2019-08-07 DIAGNOSIS — I471 Supraventricular tachycardia: Secondary | ICD-10-CM | POA: Diagnosis not present

## 2019-08-07 DIAGNOSIS — M8580 Other specified disorders of bone density and structure, unspecified site: Secondary | ICD-10-CM | POA: Insufficient documentation

## 2019-08-07 DIAGNOSIS — N3010 Interstitial cystitis (chronic) without hematuria: Secondary | ICD-10-CM | POA: Diagnosis not present

## 2019-08-07 DIAGNOSIS — D869 Sarcoidosis, unspecified: Secondary | ICD-10-CM | POA: Diagnosis not present

## 2019-08-07 DIAGNOSIS — I110 Hypertensive heart disease with heart failure: Secondary | ICD-10-CM | POA: Diagnosis not present

## 2019-08-07 DIAGNOSIS — I10 Essential (primary) hypertension: Secondary | ICD-10-CM | POA: Diagnosis not present

## 2019-08-07 DIAGNOSIS — Z8249 Family history of ischemic heart disease and other diseases of the circulatory system: Secondary | ICD-10-CM | POA: Diagnosis not present

## 2019-08-07 LAB — BASIC METABOLIC PANEL
Anion gap: 8 (ref 5–15)
BUN: 8 mg/dL (ref 8–23)
CO2: 27 mmol/L (ref 22–32)
Calcium: 8.6 mg/dL — ABNORMAL LOW (ref 8.9–10.3)
Chloride: 105 mmol/L (ref 98–111)
Creatinine, Ser: 0.69 mg/dL (ref 0.44–1.00)
GFR calc Af Amer: >60 mL/min (ref >60)
GFR calc non Af Amer: >60 mL/min (ref >60)
Glucose, Bld: 99 mg/dL (ref 70–99)
Potassium: 2.8 mmol/L — ABNORMAL LOW (ref 3.5–5.1)
Sodium: 140 mmol/L (ref 135–145)

## 2019-08-07 LAB — CBC
HCT: 36.1 % (ref 36.0–46.0)
Hemoglobin: 11.3 g/dL — ABNORMAL LOW (ref 12.0–15.0)
MCH: 29.3 pg (ref 26.0–34.0)
MCHC: 31.3 g/dL (ref 30.0–36.0)
MCV: 93.5 fL (ref 80.0–100.0)
Platelets: 290 10*3/uL (ref 150–400)
RBC: 3.86 MIL/uL — ABNORMAL LOW (ref 3.87–5.11)
RDW: 13.9 % (ref 11.5–15.5)
WBC: 3.8 10*3/uL — ABNORMAL LOW (ref 4.0–10.5)
nRBC: 0 % (ref 0.0–0.2)

## 2019-08-07 LAB — PROTIME-INR
INR: 1.1 (ref 0.8–1.2)
Prothrombin Time: 14 seconds (ref 11.4–15.2)

## 2019-08-07 MED ORDER — ENTRESTO 97-103 MG PO TABS: 1.0000 | ORAL_TABLET | Freq: Two times a day (BID) | ORAL | 3 refills | Status: DC

## 2019-08-07 NOTE — Progress Notes (Signed)
  Echocardiogram 2D Echocardiogram has been performed.  Caly Pellum G Farron Watrous 08/07/2019, 2:05 PM

## 2019-08-07 NOTE — Progress Notes (Signed)
ADVANCED HF CLINIC NOTE  Referring Physician: Dr Johnsie Cancel  Primary Care: Primary Cardiologist:Dr Johnsie Cancel   HPI: Robin Estes is a 73 year old referred to the HF clinic by Dr Johnsie Cancel for HF consultation.   73 year old with a history of interstitial cystitis, sarcoidosis (eye, lung, and skin), osteopenia, MAT, HTN,MR, pulmonary nodules on CT 03/2019, and recently diagnosed systolic heart failure. Followed by dermatology for sarcoid.   In July 2020 she was admitted to [facility] with increased shortness breath. EKG showed A fib/MAT. Started on diltiazem drip and later swtiched to toprol xl. ECHO showed reduced EF 30-35%  and mod-severe MR. Diuresed with IV lasix and transitioned to po.  Started on HF medications.   Here for routine f/u. Here with her daughter. Says she is feeling good. Can do all ADLs and walk without SOB. No orthopnea or PND. Weight stable.   Echo today EF ~45% MV appears thickened and possible rheumatic with 3+ MR. Severe LAE. Mod TR. Personally reviewed   ECHO 03/2019-  30-35% , RV normal, MV mod-severe MR.   Myoview 03/2019: no ischemia, moderate LV dysfunction 30%   cMRI 04/24/19  1.  Hilar lymphadenopathy consistent with sarcoidosis diagnosis.  2. Mildly dilated LV with EF 45%, there was mild diffuse hypokinesis with severe mid inferior hypokinesis.  3.  Normal RV size and systolic function, EF 84%.  4. Mitral regurgitation appeared severe (flow sequences to quantify were not done).  5. Possible subtle mid-wall LGE in the mid inferior wall (not seen on long axis views). This could be consistent with cardiac sarcoidosis. This is not consistent with prior MI.  Past Medical History:  Diagnosis Date  . Bladder spasms   . CHF (congestive heart failure) ([place])   . Chronic interstitial cystitis   . Dysuria   . End-stage glaucoma    RIGHT EYE  . Frequency of urination   . Legally blind in right eye, as defined in [place]    SECONDARY TO GLAUCOMA  . Nocturia   .  Sarcoidosis of skin   . Sensation of pressure in bladder area   . Urgency of urination     Current Outpatient Medications  Medication Sig Dispense Refill  . atorvastatin (LIPITOR) 20 MG tablet Take 1 tablet (20 mg total) by mouth daily at 6 PM. 30 tablet 6  . furosemide (LASIX) 20 MG tablet Take 1 tablet (20 mg total) by mouth as needed. (Patient taking differently: Take 20 mg by mouth every other day. ) 30 tablet 2  . guaiFENesin (MUCINEX) 600 MG 12 hr tablet Take 1 tablet (600 mg total) by mouth 2 (two) times daily. (Patient taking differently: Take 600 mg by mouth daily. ) 10 tablet 0  . metoprolol succinate (TOPROL-XL) 50 MG 24 hr tablet Take 1 tablet (50 mg total) by mouth daily. 30 tablet 6  . sacubitril-valsartan (ENTRESTO) 49-51 MG Take 1 tablet by mouth 2 (two) times daily. 60 tablet 3  . spironolactone (ALDACTONE) 25 MG tablet Take 0.5 tablets (12.5 mg total) by mouth daily. 30 tablet 0   No current facility-administered medications for this encounter.    Facility-Administered Medications Ordered in Other Encounters  Medication Dose Route Frequency Provider Last Rate Last Dose  . bupivacaine (MARCAINE) 0.5 % 10 mL, triamcinolone acetonide (KENALOG-40) 40 mg injection   Subcutaneous Once Carolan Clines, MD      . bupivacaine (MARCAINE) 0.5 % 15 mL, phenazopyridine (PYRIDIUM) 400 mg bladder mixture   Bladder Instillation Once Carolan Clines,  MD        Allergies  Allergen Reactions  . Cinnamon Other (See Comments)    MOUTH ULCERS      Social History   Socioeconomic History  . Marital status: Married    Spouse name: Not on file  . Number of children: Not on file  . Years of education: Not on file  . Highest education level: Not on file  Occupational History  . Not on file  Social Needs  . Financial resource strain: Not on file  . Food insecurity    Worry: Not on file    Inability: Not on file  . Transportation needs    Medical: Not on file    Non-medical:  Not on file  Tobacco Use  . Smoking status: Never Smoker  . Smokeless tobacco: Never Used  Substance and Sexual Activity  . Alcohol use: No  . Drug use: No  . Sexual activity: Not on file  Lifestyle  . Physical activity    Days per week: Not on file    Minutes per session: Not on file  . Stress: Not on file  Relationships  . Social Musician on phone: Not on file    Gets together: Not on file    Attends religious service: Not on file    Active member of club or organization: Not on file    Attends meetings of clubs or organizations: Not on file    Relationship status: Not on file  . Intimate partner violence    Fear of current or ex partner: Not on file    Emotionally abused: Not on file    Physically abused: Not on file    Forced sexual activity: Not on file  Other Topics Concern  . Not on file  Social History Narrative  . Not on file      Family History  Problem Relation Age of Onset  . CAD Father   . Heart attack Father   . Sarcoidosis Other   . Healthy Sister   . Hyperlipidemia Sister   . Healthy Sister     Vitals:   08/07/19 1414  BP: (!) 150/70  Pulse: 87  SpO2: 98%  Weight: 65.9 kg (145 lb 3.2 oz)    PHYSICAL EXAM: General:  Elderly Well appearing. No resp difficulty HEENT: normal x for opacified R eye  Neck: supple. no JVD. Carotids 2+ bilat; no bruits. No lymphadenopathy or thryomegaly appreciated. Cor: PMI nondisplaced. Regular rate & rhythm. 2/6 MR Lungs: clear Abdomen: obese soft, nontender, nondistended. No hepatosplenomegaly. No bruits or masses. Good bowel sounds. Extremities: no cyanosis, clubbing, rash, edema Neuro: alert & orientedx3, cranial nerves grossly intact. moves all 4 extremities w/o difficulty. Affect pleasant   ASSESSMENT & PLAN:  1. Chronic Systolic Heart Failure - ECHO 04/8279 EF 30-35% . Myoview - no ischemia. 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 RV normal. Minimal LGE -> doubt significant  cardiac sarcoid - Echo today EF ~45% MV appears thickened and possible rheumatic with 3+ MR. Severe LAE. Mod TR. Personally reviewed - Much improved NYHA II - Volume status looks good.  - Continue Toprol xl 50 mg daily.  - Increase entresto to 97/103 mg twice a day.  - Continue 12.5 mg spironolactone daily.  - Check BMET   2. MAT - On toprol Xl 50 mg daily. She is in SNR - Zio in 9/20       1. Sinus rhythm -  avg HR of 82      2. 22 Supraventricular Tachycardia runs occurred, the run with the fastest interval lasting 5 beats with a max rate of 184 bpm, the longest lasting 10.9 secs with an avg rate of 162 bpm.     3. Isolated PACs were occasional (4.7%, 18936)     4. Rare PVCs (<1.0%)  3. Severe mitral regurgitaion - echo reviewed with Dr. Delton See. ? Rheumatic MV with severe MR or related to severe LAE. Plan TEE to further evaluate  4. 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 Everardo All in Pulmonary. Sarcoid not felt to be active   5. HTN - BP up. Increase Jannet Askew, MD  2:32 PM

## 2019-08-07 NOTE — Patient Instructions (Signed)
INCREASE Entresto to 97/103mg  twice daily  Routine lab work today. Will notify you of abnormal results  Your provider has scheduled you for a transesophageal echo (TEE) on 08/21/2019. (please see attached instruction sheet)  Follow up in 2 months.

## 2019-08-08 ENCOUNTER — Telehealth (HOSPITAL_COMMUNITY): Payer: Self-pay | Admitting: *Deleted

## 2019-08-08 DIAGNOSIS — I5022 Chronic systolic (congestive) heart failure: Secondary | ICD-10-CM

## 2019-08-08 MED ORDER — METOPROLOL SUCCINATE ER 50 MG PO TB24: 50.0000 mg | ORAL_TABLET | Freq: Every day | ORAL | 6 refills | Status: DC

## 2019-08-08 MED ORDER — POTASSIUM CHLORIDE CRYS ER 20 MEQ PO TBCR: 40.0000 meq | EXTENDED_RELEASE_TABLET | Freq: Every day | ORAL | 3 refills | Status: DC

## 2019-08-08 MED ORDER — SPIRONOLACTONE 25 MG PO TABS: 25.0000 mg | ORAL_TABLET | Freq: Every day | ORAL | 3 refills | Status: DC

## 2019-08-08 MED ORDER — ENTRESTO 97-103 MG PO TABS: 1.0000 | ORAL_TABLET | Freq: Two times a day (BID) | ORAL | 3 refills | Status: DC

## 2019-08-08 MED ORDER — FUROSEMIDE 20 MG PO TABS: 20.0000 mg | ORAL_TABLET | ORAL | 3 refills | Status: DC

## 2019-08-08 NOTE — Telephone Encounter (Signed)
-----   Message from Jolaine Artist, MD sent at 08/08/2019  2:02 PM EST ----- Increase spiro to 25 Start kcl 40 daily.  Have her take K 120 today. Recheck firday am

## 2019-08-08 NOTE — Telephone Encounter (Signed)
Notes recorded by Harvie Junior, CMA on 08/08/2019 at 3:19 PM EST  Spoke with patients daughter she is aware of plan and verbalized understanding. Lab appt scheduled for Friday.  ------   Notes recorded by Jolaine Artist, MD on 08/08/2019 at 2:02 PM EST  Increase spiro to 25  Start kcl 40 daily.  Have her take K 120 today. Recheck firday am

## 2019-08-11 ENCOUNTER — Other Ambulatory Visit (HOSPITAL_COMMUNITY): Payer: Self-pay

## 2019-08-11 ENCOUNTER — Other Ambulatory Visit (HOSPITAL_COMMUNITY): Payer: BC Managed Care – PPO

## 2019-08-11 DIAGNOSIS — I5022 Chronic systolic (congestive) heart failure: Secondary | ICD-10-CM

## 2019-08-11 DIAGNOSIS — D869 Sarcoidosis, unspecified: Secondary | ICD-10-CM

## 2019-08-18 ENCOUNTER — Inpatient Hospital Stay (HOSPITAL_COMMUNITY): Admission: RE | Admit: 2019-08-18 | Payer: BC Managed Care – PPO | Source: Ambulatory Visit

## 2019-08-20 NOTE — Progress Notes (Signed)
Pt did not get covid test prior to TEE scheduled on 12/21. Procedure canceled.  Daughter aware and verbalized understanding, also instructed procedure will need to be rescheduled with office.  Vista Lawman, RN

## 2019-08-21 ENCOUNTER — Ambulatory Visit (HOSPITAL_COMMUNITY)
Admission: RE | Admit: 2019-08-21 | Payer: BC Managed Care – PPO | Source: Home / Self Care | Admitting: Internal Medicine

## 2019-08-21 ENCOUNTER — Telehealth (HOSPITAL_COMMUNITY): Payer: Self-pay | Admitting: *Deleted

## 2019-08-21 ENCOUNTER — Encounter (HOSPITAL_COMMUNITY): Admission: RE | Payer: Self-pay | Source: Home / Self Care

## 2019-08-21 SURGERY — ECHOCARDIOGRAM, TRANSESOPHAGEAL
Anesthesia: Moderate Sedation

## 2019-08-21 NOTE — Telephone Encounter (Signed)
Left VM to r/s TEE. TEE cancelled for today because patient did not have covid test done on 12/18 @ 1:20 pm

## 2019-08-22 NOTE — Telephone Encounter (Signed)
2nd attempt to reach pt to r/s TEE. No answer/left VM for pt to call [phone] opt.2 to r/s.

## 2019-08-29 NOTE — Telephone Encounter (Signed)
Left VM to r/s TEE on 12/21, 12/22, and today 12/28. Letter mailed for pt to call office and r/s TEE.

## 2019-10-11 ENCOUNTER — Encounter (HOSPITAL_COMMUNITY): Payer: BC Managed Care – PPO | Admitting: Internal Medicine

## 2019-12-19 ENCOUNTER — Other Ambulatory Visit (HOSPITAL_COMMUNITY): Payer: Self-pay | Admitting: Internal Medicine

## 2020-01-29 ENCOUNTER — Other Ambulatory Visit (HOSPITAL_COMMUNITY): Payer: Self-pay | Admitting: Internal Medicine

## 2020-03-05 ENCOUNTER — Other Ambulatory Visit (HOSPITAL_COMMUNITY): Payer: Self-pay | Admitting: Internal Medicine

## 2020-03-28 ENCOUNTER — Ambulatory Visit (HOSPITAL_COMMUNITY)
Admission: RE | Admit: 2020-03-28 | Discharge: 2020-03-28 | Disposition: A | Discharge status: Home / Self Care | Payer: Self-pay | Source: Ambulatory Visit | Attending: Internal Medicine | Admitting: Internal Medicine

## 2020-03-28 ENCOUNTER — Other Ambulatory Visit (HOSPITAL_COMMUNITY): Payer: Self-pay | Admitting: *Deleted

## 2020-03-28 ENCOUNTER — Other Ambulatory Visit: Payer: Self-pay

## 2020-03-28 VITALS — BP 125/85 | HR 88 | Wt 134.6 lb

## 2020-03-28 DIAGNOSIS — N3010 Interstitial cystitis (chronic) without hematuria: Secondary | ICD-10-CM | POA: Insufficient documentation

## 2020-03-28 DIAGNOSIS — I471 Supraventricular tachycardia: Secondary | ICD-10-CM | POA: Insufficient documentation

## 2020-03-28 DIAGNOSIS — M8580 Other specified disorders of bone density and structure, unspecified site: Secondary | ICD-10-CM | POA: Insufficient documentation

## 2020-03-28 DIAGNOSIS — I340 Nonrheumatic mitral (valve) insufficiency: Secondary | ICD-10-CM

## 2020-03-28 DIAGNOSIS — Z8249 Family history of ischemic heart disease and other diseases of the circulatory system: Secondary | ICD-10-CM | POA: Insufficient documentation

## 2020-03-28 DIAGNOSIS — H409 Unspecified glaucoma: Secondary | ICD-10-CM | POA: Insufficient documentation

## 2020-03-28 DIAGNOSIS — R918 Other nonspecific abnormal finding of lung field: Secondary | ICD-10-CM | POA: Insufficient documentation

## 2020-03-28 DIAGNOSIS — I110 Hypertensive heart disease with heart failure: Secondary | ICD-10-CM | POA: Insufficient documentation

## 2020-03-28 DIAGNOSIS — D8689 Sarcoidosis of other sites: Secondary | ICD-10-CM | POA: Insufficient documentation

## 2020-03-28 DIAGNOSIS — I493 Ventricular premature depolarization: Secondary | ICD-10-CM | POA: Insufficient documentation

## 2020-03-28 DIAGNOSIS — I051 Rheumatic mitral insufficiency: Secondary | ICD-10-CM

## 2020-03-28 DIAGNOSIS — D869 Sarcoidosis, unspecified: Secondary | ICD-10-CM

## 2020-03-28 DIAGNOSIS — Z79899 Other long term (current) drug therapy: Secondary | ICD-10-CM | POA: Insufficient documentation

## 2020-03-28 DIAGNOSIS — I10 Essential (primary) hypertension: Secondary | ICD-10-CM

## 2020-03-28 DIAGNOSIS — R32 Unspecified urinary incontinence: Secondary | ICD-10-CM | POA: Insufficient documentation

## 2020-03-28 DIAGNOSIS — I5022 Chronic systolic (congestive) heart failure: Secondary | ICD-10-CM

## 2020-03-28 DIAGNOSIS — H5461 Unqualified visual loss, right eye, normal vision left eye: Secondary | ICD-10-CM | POA: Insufficient documentation

## 2020-03-28 DIAGNOSIS — I4891 Unspecified atrial fibrillation: Secondary | ICD-10-CM | POA: Insufficient documentation

## 2020-03-28 DIAGNOSIS — N8110 Cystocele, unspecified: Secondary | ICD-10-CM | POA: Insufficient documentation

## 2020-03-28 LAB — BASIC METABOLIC PANEL
Anion gap: 9 (ref 5–15)
BUN: 5 mg/dL — ABNORMAL LOW (ref 8–23)
CO2: 29 mmol/L (ref 22–32)
Calcium: 8.7 mg/dL — ABNORMAL LOW (ref 8.9–10.3)
Chloride: 103 mmol/L (ref 98–111)
Creatinine, Ser: 0.71 mg/dL (ref 0.44–1.00)
GFR calc Af Amer: >60 mL/min (ref >60)
GFR calc non Af Amer: >60 mL/min (ref >60)
Glucose, Bld: 105 mg/dL — ABNORMAL HIGH (ref 70–99)
Potassium: 3.5 mmol/L (ref 3.5–5.1)
Sodium: 141 mmol/L (ref 135–145)

## 2020-03-28 LAB — BRAIN NATRIURETIC PEPTIDE: B Natriuretic Peptide: 2201.4 pg/mL — ABNORMAL HIGH (ref 0.0–100.0)

## 2020-03-28 MED ORDER — FUROSEMIDE 20 MG PO TABS: 40.0000 mg | ORAL_TABLET | Freq: Every day | ORAL | 6 refills | Status: DC

## 2020-03-28 MED ORDER — POTASSIUM CHLORIDE CRYS ER 20 MEQ PO TBCR: 20.0000 meq | EXTENDED_RELEASE_TABLET | Freq: Every day | ORAL | 6 refills | Status: DC

## 2020-03-28 NOTE — Patient Instructions (Addendum)
Start Furosemide 40 mg Daily  Start Potassium to 20 meq Daily  Labs done today, we will call you for abnormal results  Please wear your compression hose daily, place them on as soon as you get up in the morning and remove before you go to bed at night.  Your physician has requested that you have a TEE. During a TEE, sound waves are used to create images of your heart. It provides your doctor with information about the size and shape of your heart and how well your heart's chambers and valves are working. In this test, a transducer is attached to the end of a flexible tube that's guided down your throat and into your esophagus (the tube leading from you mouth to your stomach) to get a more detailed image of your heart. You are not awake for the procedure. Please see the instruction sheet given to you today. For further information please visit [email or web address]  Your physician recommends that you schedule a follow-up appointment in: 3-4 weeks  If you have any questions or concerns before your next appointment please send [place] a message through [place] or call our office at [phone].    TO LEAVE A MESSAGE FOR THE NURSE SELECT OPTION 2, PLEASE LEAVE A MESSAGE INCLUDING: . YOUR NAME . DATE OF BIRTH . CALL BACK NUMBER . REASON FOR CALL**this is important as we prioritize the call backs  YOU WILL RECEIVE A CALL BACK THE SAME DAY AS LONG AS YOU CALL BEFORE 4:00 PM  At the [facility], you and your health needs are our priority. As part of our continuing mission to provide you with exceptional heart care, we have created designated Provider Care Teams. These Care Teams include your primary Cardiologist (physician) and Advanced Practice Providers (APPs- Physician Assistants and Nurse Practitioners) who all work together to provide you with the care you need, when you need it.   You may see any of the following providers on your designated Care Team at your next follow up: Marland Kitchen Dr  Arvilla Meres . Dr Marca Ancona . Tonye Becket, NP . Robbie Lis, PA . Karle Plumber, PharmD   Please be sure to bring in all your medications bottles to every appointment.     TEE Instructions  You are scheduled for a TEE on Monday 04/08/20 with Dr. Gala Romney.  Please arrive at the [facility] (Main Entrance A) at [facility]: [address] [place], [place] [address] at 9:00 am  DIET: Nothing to eat or drink after midnight except a sip of water with medications (see medication instructions below)  Medication Instructions: Hold Furosemide and Spironolactone Mon 8/9 AM   Covid Test:  Saturday 04/06/20 at 10:30 AM, this is a drive thru testing site, please go to:  [address] [place], [place] [address]   You must have a responsible person to drive you home and stay in the waiting area during your procedure. Failure to do so could result in cancellation.  Bring your insurance cards.  *Special Note: Every effort is made to have your procedure done on time. Occasionally there are emergencies that occur at the hospital that may cause delays. Please be patient if a delay does occur.

## 2020-03-28 NOTE — Addendum Note (Signed)
Encounter addended by: Noralee Space, RN on: 03/28/2020 11:34 AM  Actions taken: Order list changed, Diagnosis association updated, Charge Capture section accepted, Clinical Note Signed

## 2020-03-28 NOTE — H&P (View-Only) (Signed)
° °ADVANCED HF CLINIC NOTE ° °Referring Physician: Dr Nishan  °Primary Care: °Primary Cardiologist:Dr Nishan  ° °HPI: °Robin Estes is a 74 year old referred to the HF clinic by Dr Nishan for HF consultation.  ° °74 year old with a history of interstitial cystitis, sarcoidosis (eye, lung, and skin), osteopenia, MAT, HTN,MR, pulmonary nodules on CT 03/2019, and recently diagnosed systolic heart failure. Followed by dermatology for sarcoid.  ° °In July 2020 she was admitted to [facility] with increased shortness breath. EKG showed A fib/MAT. Started on diltiazem drip and later swtiched to toprol xl. ECHO showed reduced EF 30-35%  and mod-severe MR. Diuresed with IV lasix and transitioned to po.  Started on HF medications.  ° °Here for routine f/u. Here with her daughter. For the past few weeks has had worsening LE edema. Has not changed meds or diet. Doesn't use much salt.  Has bladder prolapse with constant incontinence so doesn't like to drink too much. Takes lasix 20mg every other day (cut back from every day last year) ° °Echo 12/20 EF ~45% MV appears thickened and possible rheumatic with 3+ MR. Severe LAE. Mod TR. Personally reviewed ° °ECHO 03/2019-  30-35% , RV normal, MV mod-severe MR.  ° °Myoview 03/2019: no ischemia, moderate LV dysfunction 30%  ° °cMRI 04/24/19 ° °1.  Hilar lymphadenopathy consistent with sarcoidosis diagnosis. °  °2. Mildly dilated LV with EF 45%, there was mild diffuse hypokinesis °with severe mid inferior hypokinesis. °  °3.  Normal RV size and systolic function, EF 51%. °  °4. Mitral regurgitation appeared severe (flow sequences to quantify °were not done). °  °5. Possible subtle mid-wall LGE in the mid inferior wall (not seen °on long axis views). This could be consistent with cardiac °sarcoidosis. This is not consistent with prior MI. ° °Past Medical History:  °Diagnosis Date  °• Bladder spasms   °• CHF (congestive heart failure) ([facility])   °• Chronic interstitial cystitis   °• Dysuria   °• End-stage  glaucoma   ° RIGHT EYE  °• Frequency of urination   °• Legally blind in right eye, as defined in [place]   ° SECONDARY TO GLAUCOMA  °• Nocturia   °• Sarcoidosis of skin   °• Sensation of pressure in bladder area   °• Urgency of urination   ° ° °Current Outpatient Medications  °Medication Sig Dispense Refill  °• atorvastatin (LIPITOR) 20 MG tablet Take 1 tablet (20 mg total) by mouth daily at 6 PM. 30 tablet 6  °• furosemide (LASIX) 20 MG tablet TAKE 1 TABLET BY MOUTH EVERY OTHER DAY 30 tablet 0  °• guaiFENesin (MUCINEX) 600 MG 12 hr tablet Take 600 mg by mouth daily.    °• metoprolol succinate (TOPROL-XL) 50 MG 24 hr tablet Take 1 tablet (50 mg total) by mouth daily. 30 tablet 6  °• potassium chloride SA (KLOR-CON) 20 MEQ tablet Take 20 mEq by mouth as needed.    °• sacubitril-valsartan (ENTRESTO) 97-103 MG Take 1 tablet by mouth 2 (two) times daily. 60 tablet 3  °• spironolactone (ALDACTONE) 25 MG tablet Take 1 tablet (25 mg total) by mouth daily. Needs appt for further refills 90 tablet 0  ° °No current facility-administered medications for this encounter.  ° °Facility-Administered Medications Ordered in Other Encounters  °Medication Dose Route Frequency Provider Last Rate Last Admin  °• bupivacaine (MARCAINE) 0.5 % 10 mL, triamcinolone acetonide (KENALOG-40) 40 mg injection   Subcutaneous Once Tannenbaum, Sigmund, MD      °• bupivacaine (MARCAINE)   0.5 % 15 mL, phenazopyridine (PYRIDIUM) 400 mg bladder mixture   Bladder Instillation Once Tannenbaum, Sigmund, MD      ° ° °Allergies  °Allergen Reactions  °• Cinnamon Other (See Comments)  °  MOUTH ULCERS  ° ° °  °Social History  ° °Socioeconomic History  °• Marital status: Married  °  Spouse name: Not on file  °• Number of children: Not on file  °• Years of education: Not on file  °• Highest education level: Not on file  °Occupational History  °• Not on file  °Tobacco Use  °• Smoking status: Never Smoker  °• Smokeless tobacco: Never Used  °Substance and Sexual Activity    °• Alcohol use: No  °• Drug use: No  °• Sexual activity: Not on file  °Other Topics Concern  °• Not on file  °Social History Narrative  °• Not on file  ° °Social Determinants of Health  ° °Financial Resource Strain:   °• Difficulty of Paying Living Expenses:   °Food Insecurity:   °• Worried About Running Out of Food in the Last Year:   °• Ran Out of Food in the Last Year:   °Transportation Needs:   °• Lack of Transportation (Medical):   °• Lack of Transportation (Non-Medical):   °Physical Activity:   °• Days of Exercise per Week:   °• Minutes of Exercise per Session:   °Stress:   °• Feeling of Stress :   °Social Connections:   °• Frequency of Communication with Friends and Family:   °• Frequency of Social Gatherings with Friends and Family:   °• Attends Religious Services:   °• Active Member of Clubs or Organizations:   °• Attends Club or Organization Meetings:   °• Marital Status:   °Intimate Partner Violence:   °• Fear of Current or Ex-Partner:   °• Emotionally Abused:   °• Physically Abused:   °• Sexually Abused:   ° ° °  °Family History  °Problem Relation Age of Onset  °• CAD Father   °• Heart attack Father   °• Sarcoidosis Other   °• Healthy Sister   °• Hyperlipidemia Sister   °• Healthy Sister   ° ° °Vitals:  ° 03/28/20 1018  °BP: 125/85  °Pulse: 88  °SpO2: 99%  °Weight: 61.1 kg (134 lb 9.6 oz)  ° ° °PHYSICAL EXAM: °General:  Elderly well appearing °HEENT: normal x for opacified R eye  °Neck: supple. JVP 9 with prominent CV waves. Carotids 2+ bilat; no bruits. No lymphadenopathy or thryomegaly appreciated. °Cor: PMI nondisplaced. Regular rate & rhythm. No rubs, gallops or murmurs. °Lungs: clear °Abdomen: soft, nontender, nondistended. No hepatosplenomegaly. No bruits or masses. Good bowel sounds. °Extremities: no cyanosis, clubbing, rash, 2-3+ edema °Neuro: alert & orientedx3, cranial nerves grossly intact. moves all 4 extremities w/o difficulty. Affect pleasant °es all 4 extremities w/o difficulty. Affect  pleasant ° ° °ASSESSMENT & PLAN: ° °1. Chronic Systolic Heart Failure °- ECHO 03/2019 EF 30-35% . Myoview - no ischemia. 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 RV normal. Minimal LGE -> doubt significant cardiac sarcoid °- Echo 12/20 EF ~45% MV appears thickened and possible rheumatic with 3+ MR. Severe LAE. Mod TR. Personally reviewed °- Stable NYHA II °- Volume status elevated °- Continue Toprol xl 50 mg daily.  °- Continue entresto 97/103 mg twice a day.  °- Continue 12.5 mg spironolactone daily.  °- Increase asix to 40 daily with Kdur 20 °- Add compression hose °-   Labs today and 1 week °- F/u NP/PA 2 weeks  °- Will need TEE as below to evaluate MV ° °2. MAT °- On toprol Xl 50 mg daily. She is in SNR °- Zio in 9/20  °     1. Sinus rhythm - avg HR of 82 °     2. 22 Supraventricular Tachycardia runs occurred, the run with the fastest interval lasting 5 beats with a max rate of 184 bpm, the longest lasting 10.9 secs with an avg rate of 162 bpm. °    3. Isolated PACs were occasional (4.7%, 18936) °    4. Rare PVCs (<1.0%) ° °3. Severe mitral regurgitaion °-  ? Rheumatic MV with severe MR or related to severe LAE. Plan TEE to further evaluate. °- Will d/w Dr. Nishan ° °4. Sarcoidosis  °- Biopsy proven by Dermatology °- Follows with Dr. Ellison in Pulmonary. Sarcoid not felt to be active  ° °5. HTN °- Blood pressure well controlled. Continue current regimen. ° ° °Tamas Suen, MD  °10:29 AM ° ° ° ° ° °

## 2020-03-28 NOTE — Progress Notes (Signed)
ADVANCED HF CLINIC NOTE  Referring Physician: Dr Eden Emms  Primary Care: Primary Cardiologist:Dr Eden Emms   HPI: Robin Estes is a 74 year old referred to the HF clinic by Dr Eden Emms for HF consultation.   74 year old with a history of interstitial cystitis, sarcoidosis (eye, lung, and skin), osteopenia, MAT, HTN,MR, pulmonary nodules on CT 03/2019, and recently diagnosed systolic heart failure. Followed by dermatology for sarcoid.   In July 2020 she was admitted to [facility] with increased shortness breath. EKG showed A fib/MAT. Started on diltiazem drip and later swtiched to toprol xl. ECHO showed reduced EF 30-35%  and mod-severe MR. Diuresed with IV lasix and transitioned to po.  Started on HF medications.   Here for routine f/u. Here with her daughter. For the past few weeks has had worsening LE edema. Has not changed meds or diet. Doesn't use much salt.  Has bladder prolapse with constant incontinence so doesn't like to drink too much. Takes lasix 20mg  every other day (cut back from every day last year)  Echo 12/20 EF ~45% MV appears thickened and possible rheumatic with 3+ MR. Severe LAE. Mod TR. Personally reviewed  ECHO 03/2019-  30-35% , RV normal, MV mod-severe MR.   Myoview 03/2019: no ischemia, moderate LV dysfunction 30%   cMRI 04/24/19  1.  Hilar lymphadenopathy consistent with sarcoidosis diagnosis.  2. Mildly dilated LV with EF 45%, there was mild diffuse hypokinesis with severe mid inferior hypokinesis.  3.  Normal RV size and systolic function, EF 51%.  4. Mitral regurgitation appeared severe (flow sequences to quantify were not done).  5. Possible subtle mid-wall LGE in the mid inferior wall (not seen on long axis views). This could be consistent with cardiac sarcoidosis. This is not consistent with prior MI.  Past Medical History:  Diagnosis Date   Bladder spasms    CHF (congestive heart failure) (HCC)    Chronic interstitial cystitis    Dysuria    End-stage  glaucoma    RIGHT EYE   Frequency of urination    Legally blind in right eye, as defined in 04/26/19    SECONDARY TO GLAUCOMA   Nocturia    Sarcoidosis of skin    Sensation of pressure in bladder area    Urgency of urination     Current Outpatient Medications  Medication Sig Dispense Refill   atorvastatin (LIPITOR) 20 MG tablet Take 1 tablet (20 mg total) by mouth daily at 6 PM. 30 tablet 6   furosemide (LASIX) 20 MG tablet TAKE 1 TABLET BY MOUTH EVERY OTHER DAY 30 tablet 0   guaiFENesin (MUCINEX) 600 MG 12 hr tablet Take 600 mg by mouth daily.     metoprolol succinate (TOPROL-XL) 50 MG 24 hr tablet Take 1 tablet (50 mg total) by mouth daily. 30 tablet 6   potassium chloride SA (KLOR-CON) 20 MEQ tablet Take 20 mEq by mouth as needed.     sacubitril-valsartan (ENTRESTO) 97-103 MG Take 1 tablet by mouth 2 (two) times daily. 60 tablet 3   spironolactone (ALDACTONE) 25 MG tablet Take 1 tablet (25 mg total) by mouth daily. Needs appt for further refills 90 tablet 0   No current facility-administered medications for this encounter.   Facility-Administered Medications Ordered in Other Encounters  Medication Dose Route Frequency Provider Last Rate Last Admin   bupivacaine (MARCAINE) 0.5 % 10 mL, triamcinolone acetonide (KENALOG-40) 40 mg injection   Subcutaneous Once [place], MD       bupivacaine (MARCAINE)  0.5 % 15 mL, phenazopyridine (PYRIDIUM) 400 mg bladder mixture   Bladder Instillation Once Jethro Bolus, MD        Allergies  Allergen Reactions   Cinnamon Other (See Comments)    MOUTH ULCERS      Social History   Socioeconomic History   Marital status: Married    Spouse name: Not on file   Number of children: Not on file   Years of education: Not on file   Highest education level: Not on file  Occupational History   Not on file  Tobacco Use   Smoking status: Never Smoker   Smokeless tobacco: Never Used  Substance and Sexual Activity     Alcohol use: No   Drug use: No   Sexual activity: Not on file  Other Topics Concern   Not on file  Social History Narrative   Not on file   Social Determinants of Health   Financial Resource Strain:    Difficulty of Paying Living Expenses:   Food Insecurity:    Worried About Running Out of Food in the Last Year:    Barista in the Last Year:   Transportation Needs:    Freight forwarder (Medical):    Lack of Transportation (Non-Medical):   Physical Activity:    Days of Exercise per Week:    Minutes of Exercise per Session:   Stress:    Feeling of Stress :   Social Connections:    Frequency of Communication with Friends and Family:    Frequency of Social Gatherings with Friends and Family:    Attends Religious Services:    Active Member of Clubs or Organizations:    Attends Engineer, structural:    Marital Status:   Intimate Partner Violence:    Fear of Current or Ex-Partner:    Emotionally Abused:    Physically Abused:    Sexually Abused:       Family History  Problem Relation Age of Onset   CAD Father    Heart attack Father    Sarcoidosis Other    Healthy Sister    Hyperlipidemia Sister    Healthy Sister     Vitals:   03/28/20 1018  BP: 125/85  Pulse: 88  SpO2: 99%  Weight: 61.1 kg (134 lb 9.6 oz)    PHYSICAL EXAM: General:  Elderly well appearing HEENT: normal x for opacified R eye  Neck: supple. JVP 9 with prominent CV waves. Carotids 2+ bilat; no bruits. No lymphadenopathy or thryomegaly appreciated. Cor: PMI nondisplaced. Regular rate & rhythm. No rubs, gallops or murmurs. Lungs: clear Abdomen: soft, nontender, nondistended. No hepatosplenomegaly. No bruits or masses. Good bowel sounds. Extremities: no cyanosis, clubbing, rash, 2-3+ edema Neuro: alert & orientedx3, cranial nerves grossly intact. moves all 4 extremities w/o difficulty. Affect pleasant es all 4 extremities w/o difficulty. Affect  pleasant   ASSESSMENT & PLAN:  1. Chronic Systolic Heart Failure - ECHO 10/945 EF 30-35% . Myoview - no ischemia. 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 RV normal. Minimal LGE -> doubt significant cardiac sarcoid - Echo 12/20 EF ~45% MV appears thickened and possible rheumatic with 3+ MR. Severe LAE. Mod TR. Personally reviewed - Stable NYHA II - Volume status elevated - Continue Toprol xl 50 mg daily.  - Continue entresto 97/103 mg twice a day.  - Continue 12.5 mg spironolactone daily.  - Increase asix to 40 daily with Kdur 20 - Add compression hose -  Labs today and 1 week - F/u NP/PA 2 weeks  - Will need TEE as below to evaluate MV  2. MAT - On toprol Xl 50 mg daily. She is in SNR - [facility] in 9/20       1. Sinus rhythm - avg HR of 82      2. 22 Supraventricular Tachycardia runs occurred, the run with the fastest interval lasting 5 beats with a max rate of 184 bpm, the longest lasting 10.9 secs with an avg rate of 162 bpm.     3. Isolated PACs were occasional (4.7%, 18936)     4. Rare PVCs (<1.0%)  3. Severe mitral regurgitaion -  ? Rheumatic MV with severe MR or related to severe LAE. Plan TEE to further evaluate. - Will d/w Dr. Eden Emms  4. 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 Everardo All in Pulmonary. Sarcoid not felt to be active   5. HTN - Blood pressure well controlled. Continue current regimen.   Arvilla Meres, MD  10:29 AM

## 2020-03-29 ENCOUNTER — Telehealth: Payer: Self-pay

## 2020-03-29 NOTE — Telephone Encounter (Signed)
-----   Message from Quintella Reichert, MD sent at 03/28/2020  1:34 PM EDT ----- Kinnie Feil  Please have patient followup with me after her TEE 8/9  Traci ----- Message ----- From: Wendall Stade, MD Sent: 03/28/2020  12:39 PM EDT To: Quintella Reichert, MD, Loa Socks, LPN, #  Dr Mayford Knife patient I just rounded on her should f/u with her after TEE 04/08/20  ----- Message ----- From: Dolores Patty, MD Sent: 03/28/2020  12:13 PM EDT To: Wendall Stade, MD

## 2020-03-29 NOTE — Telephone Encounter (Signed)
Spoke with the patient's daughter and have made an appointment for her to see Dr. Mayford Knife on 08/23.

## 2020-04-04 ENCOUNTER — Other Ambulatory Visit: Payer: Self-pay

## 2020-04-04 ENCOUNTER — Ambulatory Visit (HOSPITAL_COMMUNITY)
Admission: RE | Admit: 2020-04-04 | Discharge: 2020-04-04 | Disposition: A | Discharge status: Home / Self Care | Payer: Self-pay | Source: Ambulatory Visit | Attending: Cardiology | Admitting: Cardiology

## 2020-04-04 ENCOUNTER — Other Ambulatory Visit (HOSPITAL_COMMUNITY): Payer: Self-pay | Admitting: *Deleted

## 2020-04-04 DIAGNOSIS — I5022 Chronic systolic (congestive) heart failure: Secondary | ICD-10-CM | POA: Insufficient documentation

## 2020-04-04 LAB — BASIC METABOLIC PANEL
Anion gap: 9 (ref 5–15)
BUN: 5 mg/dL — ABNORMAL LOW (ref 8–23)
CO2: 29 mmol/L (ref 22–32)
Calcium: 8.6 mg/dL — ABNORMAL LOW (ref 8.9–10.3)
Chloride: 101 mmol/L (ref 98–111)
Creatinine, Ser: 0.71 mg/dL (ref 0.44–1.00)
GFR calc Af Amer: >60 mL/min (ref >60)
GFR calc non Af Amer: >60 mL/min (ref >60)
Glucose, Bld: 107 mg/dL — ABNORMAL HIGH (ref 70–99)
Potassium: 3.3 mmol/L — ABNORMAL LOW (ref 3.5–5.1)
Sodium: 139 mmol/L (ref 135–145)

## 2020-04-04 LAB — BRAIN NATRIURETIC PEPTIDE: B Natriuretic Peptide: 610.5 pg/mL — ABNORMAL HIGH (ref 0.0–100.0)

## 2020-04-06 ENCOUNTER — Other Ambulatory Visit (HOSPITAL_COMMUNITY)
Admission: RE | Admit: 2020-04-06 | Discharge: 2020-04-06 | Disposition: A | Discharge status: Home / Self Care | Payer: HRSA Program | Source: Ambulatory Visit | Attending: Internal Medicine | Admitting: Internal Medicine

## 2020-04-06 DIAGNOSIS — Z01812 Encounter for preprocedural laboratory examination: Secondary | ICD-10-CM | POA: Insufficient documentation

## 2020-04-06 DIAGNOSIS — Z20822 Contact with and (suspected) exposure to covid-19: Secondary | ICD-10-CM | POA: Diagnosis not present

## 2020-04-06 LAB — SARS CORONAVIRUS 2 (TAT 6-24 HRS): SARS Coronavirus 2: NEGATIVE

## 2020-04-08 ENCOUNTER — Ambulatory Visit (HOSPITAL_COMMUNITY)
Admission: RE | Admit: 2020-04-08 | Discharge: 2020-04-08 | Disposition: A | Discharge status: Home / Self Care | Payer: Self-pay | Attending: Internal Medicine | Admitting: Internal Medicine

## 2020-04-08 ENCOUNTER — Encounter (HOSPITAL_COMMUNITY): Payer: Self-pay | Admitting: Internal Medicine

## 2020-04-08 ENCOUNTER — Encounter (HOSPITAL_COMMUNITY)
Admission: RE | Disposition: A | Discharge status: Home / Self Care | Payer: Self-pay | Source: Home / Self Care | Attending: Internal Medicine

## 2020-04-08 ENCOUNTER — Ambulatory Visit (HOSPITAL_COMMUNITY): Payer: Self-pay | Admitting: Anesthesiology

## 2020-04-08 ENCOUNTER — Ambulatory Visit (HOSPITAL_BASED_OUTPATIENT_CLINIC_OR_DEPARTMENT_OTHER)
Admission: RE | Admit: 2020-04-08 | Discharge: 2020-04-08 | Disposition: A | Discharge status: Home / Self Care | Payer: Self-pay | Source: Ambulatory Visit | Attending: Internal Medicine | Admitting: Internal Medicine

## 2020-04-08 ENCOUNTER — Other Ambulatory Visit: Payer: Self-pay

## 2020-04-08 DIAGNOSIS — I361 Nonrheumatic tricuspid (valve) insufficiency: Secondary | ICD-10-CM

## 2020-04-08 DIAGNOSIS — I110 Hypertensive heart disease with heart failure: Secondary | ICD-10-CM | POA: Insufficient documentation

## 2020-04-08 DIAGNOSIS — I083 Combined rheumatic disorders of mitral, aortic and tricuspid valves: Secondary | ICD-10-CM | POA: Insufficient documentation

## 2020-04-08 DIAGNOSIS — D869 Sarcoidosis, unspecified: Secondary | ICD-10-CM | POA: Insufficient documentation

## 2020-04-08 DIAGNOSIS — I5022 Chronic systolic (congestive) heart failure: Secondary | ICD-10-CM | POA: Insufficient documentation

## 2020-04-08 DIAGNOSIS — I340 Nonrheumatic mitral (valve) insufficiency: Secondary | ICD-10-CM

## 2020-04-08 DIAGNOSIS — Z79899 Other long term (current) drug therapy: Secondary | ICD-10-CM | POA: Insufficient documentation

## 2020-04-08 DIAGNOSIS — M8580 Other specified disorders of bone density and structure, unspecified site: Secondary | ICD-10-CM | POA: Insufficient documentation

## 2020-04-08 DIAGNOSIS — I471 Supraventricular tachycardia: Secondary | ICD-10-CM | POA: Insufficient documentation

## 2020-04-08 DIAGNOSIS — Z8249 Family history of ischemic heart disease and other diseases of the circulatory system: Secondary | ICD-10-CM | POA: Insufficient documentation

## 2020-04-08 DIAGNOSIS — H5461 Unqualified visual loss, right eye, normal vision left eye: Secondary | ICD-10-CM | POA: Insufficient documentation

## 2020-04-08 HISTORY — PX: TEE WITHOUT CARDIOVERSION: SHX5443

## 2020-04-08 SURGERY — ECHOCARDIOGRAM, TRANSESOPHAGEAL
Anesthesia: Monitor Anesthesia Care

## 2020-04-08 MED ORDER — LIDOCAINE HCL (CARDIAC) PF 100 MG/5ML IV SOSY
PREFILLED_SYRINGE | INTRAVENOUS | Status: DC | PRN
  Administered 2020-04-08: 20 mg via INTRAVENOUS

## 2020-04-08 MED ORDER — PROPOFOL 500 MG/50ML IV EMUL
INTRAVENOUS | Status: DC | PRN
  Administered 2020-04-08: 125 ug/kg/min via INTRAVENOUS

## 2020-04-08 MED ORDER — ONDANSETRON HCL 4 MG/2ML IJ SOLN
INTRAMUSCULAR | Status: DC | PRN
  Administered 2020-04-08: 4 mg via INTRAVENOUS

## 2020-04-08 MED ORDER — SODIUM CHLORIDE 0.9 % IV SOLN: INTRAVENOUS | Status: DC

## 2020-04-08 NOTE — Anesthesia Procedure Notes (Signed)
Procedure Name: MAC Date/Time: 04/01/2020 10:43 AM Performed by: Lavell Luster, CRNA Pre-anesthesia Checklist: Patient identified, Emergency Drugs available, Suction available, Patient being monitored and Timeout performed Patient Re-evaluated:Patient Re-evaluated prior to induction Preoxygenation: Pre-oxygenation with 100% oxygen Placement Confirmation: breath sounds checked- equal and bilateral and positive ETCO2 Dental Injury: Teeth and Oropharynx as per pre-operative assessment

## 2020-04-08 NOTE — Progress Notes (Signed)
  Echocardiogram Echocardiogram Transesophageal has been performed.  Robin Estes 04/08/2020, 11:22 AM

## 2020-04-08 NOTE — Interval H&P Note (Signed)
History and Physical Interval Note:  04/08/2020 8:58 AM  Robin Estes  has presented today for surgery, with the diagnosis of MITRAL REGURGITATION.  The various methods of treatment have been discussed with the patient and family. After consideration of risks, benefits and other options for treatment, the patient has consented to  Procedure(s): TRANSESOPHAGEAL ECHOCARDIOGRAM (TEE) (N/A) as a surgical intervention.  The patient's history has been reviewed, patient examined, no change in status, stable for surgery.  I have reviewed the patient's chart and labs.  Questions were answered to the patient's satisfaction.     Cort Dragoo

## 2020-04-08 NOTE — Anesthesia Preprocedure Evaluation (Signed)
Anesthesia Evaluation  Patient identified by MRN, date of birth, ID band Patient awake    Reviewed: Allergy & Precautions, H&P , NPO status , Patient's Chart, lab work & pertinent test results  Airway Mallampati: II   Neck ROM: full    Dental   Pulmonary neg pulmonary ROS,    breath sounds clear to auscultation       Cardiovascular +CHF  + Valvular Problems/Murmurs MR  Rhythm:regular Rate:Normal     Neuro/Psych    GI/Hepatic   Endo/Other    Renal/GU      Musculoskeletal   Abdominal   Peds  Hematology   Anesthesia Other Findings   Reproductive/Obstetrics                             Anesthesia Physical Anesthesia Plan  ASA: III  Anesthesia Plan: MAC   Post-op Pain Management:    Induction: Intravenous  PONV Risk Score and Plan: 2 and Propofol infusion and Treatment may vary due to age or medical condition  Airway Management Planned: Nasal Cannula  Additional Equipment:   Intra-op Plan:   Post-operative Plan:   Informed Consent: I have reviewed the patients History and Physical, chart, labs and discussed the procedure including the risks, benefits and alternatives for the proposed anesthesia with the patient or authorized representative who has indicated his/her understanding and acceptance.       Plan Discussed with: CRNA, Anesthesiologist and Surgeon  Anesthesia Plan Comments:         Anesthesia Quick Evaluation

## 2020-04-08 NOTE — CV Procedure (Addendum)
    TRANSESOPHAGEAL ECHOCARDIOGRAM   NAME:  Classie Weng   MRN: [id number] DOB:  1946/07/25   ADMIT DATE: 04/08/2020  INDICATIONS:  Mitral reguritation  PROCEDURE:   Informed consent was obtained prior to the procedure. The risks, benefits and alternatives for the procedure were discussed and the patient comprehended these risks.  Risks include, but are not limited to, cough, sore throat, vomiting, nausea, somnolence, esophageal and stomach trauma or perforation, bleeding, low blood pressure, aspiration, pneumonia, infection, trauma to the teeth and death.    After a procedural time-out, the patient was sedated by the anesthesia service with IV propofol. The transesophageal probe was inserted in the esophagus and stomach without difficulty and multiple views were obtained.    COMPLICATIONS:    There were no immediate complications.  FINDINGS:  LEFT VENTRICLE: EF = 40-45%. Mild global HK  RIGHT VENTRICLE: Normal size and function.   LEFT ATRIUM: Severely dilated  LEFT ATRIAL APPENDAGE: No thrombus.   RIGHT ATRIUM: Normal   AORTIC VALVE:  Trileaflet. Mild calcification. No AI/AS.  MITRAL VALVE:    Mildly thickened. Moderate to severe functional central MR. No signficant flow reversal in PVs  TRICUSPID VALVE: Normal. Moderate to severe TR directed toward septum   PULMONIC VALVE: Normal. Trivial PI  INTERATRIAL SEPTUM: No PFO or ASD.  PERICARDIUM: No effusion  DESCENDING AORTA: Mild to moderate plaque   Laquitta Dominski,MD 11:12 AM

## 2020-04-08 NOTE — Discharge Instructions (Signed)

## 2020-04-08 NOTE — Transfer of Care (Signed)
Immediate Anesthesia Transfer of Care Note  Patient: Robin Estes  Procedure(s) Performed: TRANSESOPHAGEAL ECHOCARDIOGRAM (TEE) (N/A )  Patient Location: Endoscopy Unit  Anesthesia Type:MAC  Level of Consciousness: sedated  Airway & Oxygen Therapy: Patient connected to nasal cannula oxygen  Post-op Assessment: Post -op Vital signs reviewed and stable  Post vital signs: stable  Last Vitals:  Vitals Value Taken Time  BP    Temp    Pulse    Resp    SpO2      Last Pain:  Vitals:   04/08/20 0900  TempSrc: Temporal  PainSc: 0-No pain         Complications: No complications documented.

## 2020-04-10 NOTE — Anesthesia Postprocedure Evaluation (Signed)
Anesthesia Post Note  Patient: Robin Estes  Procedure(s) Performed: TRANSESOPHAGEAL ECHOCARDIOGRAM (TEE) (N/A )     Patient location during evaluation: Endoscopy Anesthesia Type: MAC Level of consciousness: awake and alert Pain management: pain level controlled Vital Signs Assessment: post-procedure vital signs reviewed and stable Respiratory status: spontaneous breathing, nonlabored ventilation, respiratory function stable and patient connected to nasal cannula oxygen Cardiovascular status: stable and blood pressure returned to baseline Postop Assessment: no apparent nausea or vomiting Anesthetic complications: no   No complications documented.  Last Vitals:  Vitals:   04/08/20 1125 04/08/20 1135  BP: 122/89 117/68  Pulse: 89 88  Resp: 20 (!) 25  Temp:    SpO2: 100% 100%    Last Pain:  Vitals:   04/08/20 1135  TempSrc:   PainSc: 0-No pain                 [name] S

## 2020-04-18 ENCOUNTER — Other Ambulatory Visit (HOSPITAL_COMMUNITY): Payer: Self-pay | Admitting: *Deleted

## 2020-04-18 DIAGNOSIS — I5022 Chronic systolic (congestive) heart failure: Secondary | ICD-10-CM

## 2020-04-22 ENCOUNTER — Encounter: Payer: Self-pay | Admitting: Cardiology

## 2020-04-22 ENCOUNTER — Ambulatory Visit (INDEPENDENT_AMBULATORY_CARE_PROVIDER_SITE_OTHER): Payer: Self-pay | Admitting: Cardiology

## 2020-04-22 ENCOUNTER — Other Ambulatory Visit: Payer: Self-pay

## 2020-04-22 VITALS — BP 92/62 | HR 92 | Ht 59.0 in | Wt 121.0 lb

## 2020-04-22 DIAGNOSIS — I340 Nonrheumatic mitral (valve) insufficiency: Secondary | ICD-10-CM

## 2020-04-22 DIAGNOSIS — I5042 Chronic combined systolic (congestive) and diastolic (congestive) heart failure: Secondary | ICD-10-CM

## 2020-04-22 DIAGNOSIS — I10 Essential (primary) hypertension: Secondary | ICD-10-CM

## 2020-04-22 DIAGNOSIS — D869 Sarcoidosis, unspecified: Secondary | ICD-10-CM

## 2020-04-22 DIAGNOSIS — I471 Supraventricular tachycardia: Secondary | ICD-10-CM

## 2020-04-22 NOTE — Patient Instructions (Signed)
Medication Instructions:  Your physician recommends that you continue on your current medications as directed. Please refer to the Current Medication list given to you today.  *If you need a refill on your cardiac medications before your next appointment, please call your pharmacy*   Lab Work: TODAY: BMET, BNP If you have labs (blood work) drawn today and your tests are completely normal, you will receive your results only by:  MyChart Message (if you have MyChart) OR  A paper copy in the mail If you have any lab test that is abnormal or we need to change your treatment, we will call you to review the results.   Follow-Up: At [facility], you and your health needs are our priority.  As part of our continuing mission to provide you with exceptional heart care, we have created designated Provider Care Teams.  These Care Teams include your primary Cardiologist (physician) and Advanced Practice Providers (APPs -  Physician Assistants and Nurse Practitioners) who all work together to provide you with the care you need, when you need it.  We recommend signing up for the patient portal called "MyChart".  Sign up information is provided on this After Visit Summary.  MyChart is used to connect with patients for Virtual Visits (Telemedicine).  Patients are able to view lab/test results, encounter notes, upcoming appointments, etc.  Non-urgent messages can be sent to your provider as well.   To learn more about what you can do with MyChart, go to [email or web address].    Your next appointment:   6 month(s)  The format for your next appointment:   In Person  Provider:   You may see Armanda Magic, MD or one of the following Advanced Practice Providers on your designated Care Team:    Ronie Spies, PA-C  Jacolyn Reedy, PA-C    Other Instructions You have been referred to see Dr. Royann Shivers

## 2020-04-22 NOTE — Progress Notes (Signed)
Cardiology Office Note:    Date:  04/22/2020   ID:  Robin Estes, DOB 1945/12/20, MRN [id number]  PCP:  Ladora Daniel, PA-C  Cardiologist:  Armanda Magic, MD    Referring MD: Ladora Daniel, PA-C   Chief Complaint  Patient presents with   Congestive Heart Failure   Mitral Regurgitation    History of Present Illness:    Robin Estes is a 74 y.o. female with a hx of interstitial cystitis, sarcoidosis (eye, lung, and skin), osteopenia, MAT, HTN, MR, pulmonary nodules on CT 03/2019, and recently diagnosed systolic heart failure. Followed by dermatology for sarcoid.   In July 2020 she was admitted to [facility] with increased shortness breath. EKG showed A fib/MAT. Started on diltiazem drip and later swtiched to toprol xl. ECHO showed reduced EF 30-35%  and mod-severe MR. Diuresed with IV lasix and transitioned to po.  Started on HF medications.   She was referred to AHF service outpt and when seen by Dr. Gala Romney was complaining of worsening LE edema. She had not changed her diet or Na intake at that time.  She has bladder prolapse with constant incontinence so doesn't like to drink too much. She was taking lasix 20mg  every other day (cut back from every day last year)  Echo 12/20 EF ~45% MV appears thickened and possible rheumatic with 3+ MR. Severe LAE. Mod TR.  ECHO 03/2019-  30-35% , RV normal, MV mod-severe MR.   Myoview 03/2019: no ischemia, moderate LV dysfunction 30%   cMRI 04/24/19  1. Hilar lymphadenopathy consistent with sarcoidosis diagnosis.  2. Mildly dilated LV with EF 45%, there was mild diffuse hypokinesis with severe mid inferior hypokinesis.  3. Normal RV size and systolic function, EF 51%.  4. Mitral regurgitation appeared severe (flow sequences to quantify were not done).  5. Possible subtle mid-wall LGE in the mid inferior wall (not seen on long axis views). This could be consistent with cardiac sarcoidosis. This is not consistent with prior MI  She  underwent TEE by Dr. Gala Romney which showed mildly reduced LVF with EF 40-45% with global HK, severe LAE, mildly thickened MV leaflets with moderate to severe functional central MR, moderate to severe TR and mild to moderate aortic atherosclerosis.  She is now referred back to discuss results of TEE.  Her daughter is with her today and said that Dr. Gala Romney felt that the MV is not quite ready for repair but possible mitraclip down the road may be needed.   She is doing well today. She denies any CP or pressure, SOB, DOE, PND, orthopnea, LE edema, dizziness, palpitations or syncope.  She is tolerating her HF meds.    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   Sarcoidosis 03/15/2019   Sarcoidosis of skin    Sensation of pressure in bladder area    Solitary pulmonary nodule 07/09/2019   Urgency of urination  Past Surgical History:  Procedure Laterality Date   CATARACT EXTRACTION W/ INTRAOCULAR LENS IMPLANT Left    CYSTOSCOPY WITH HYDRODISTENSION AND BIOPSY N/A 04/21/2013   Procedure: CYSTOSCOPY/BLADDER BIOPSY/HYDRODISTENSION;  Surgeon: Kathi Ludwig, MD;  Location: [facility];  Service: Urology;  Laterality: N/A;   TEE WITHOUT CARDIOVERSION N/A 04/08/2020   Procedure: TRANSESOPHAGEAL ECHOCARDIOGRAM (TEE);  Surgeon: Dolores Patty, MD;  Location: [facility] ENDOSCOPY;  Service: Cardiovascular;  Laterality: N/A;     Current Medications: Current Meds  Medication Sig   atorvastatin (LIPITOR) 20 MG tablet Take 1 tablet (20 mg total) by mouth daily at 6 PM.   furosemide (LASIX) 20 MG tablet Take 2 tablets (40 mg total) by mouth daily.   guaiFENesin (MUCINEX) 600 MG 12 hr tablet Take 600 mg by mouth daily.   metoprolol succinate (TOPROL-XL) 50 MG 24 hr tablet Take 1 tablet (50 mg total) by mouth daily.   Multiple Vitamins-Minerals (MULTIVITAMIN WITH MINERALS) tablet Take 1 tablet by mouth daily.   potassium chloride SA (KLOR-CON) 20 MEQ tablet Take 1 tablet (20 mEq total) by mouth daily.   sacubitril-valsartan (ENTRESTO) 97-103 MG Take 1 tablet by mouth 2 (two) times daily.   spironolactone (ALDACTONE) 25 MG tablet Take 1 tablet (25 mg total) by mouth daily. Needs appt for further refills     Allergies:   Cinnamon   Social History   Socioeconomic History   Marital status: Married    Spouse name: Not on file   Number of children: Not on file   Years of education: Not on file   Highest education level: Not on file  Occupational History   Not on file  Tobacco Use   Smoking status: Never Smoker   Smokeless tobacco: Never Used  Substance and Sexual Activity   Alcohol use: No   Drug use: No   Sexual activity: Not on file  Other Topics Concern   Not on file  Social History Narrative   Not on file   Social Determinants of Health   Financial Resource Strain:    Difficulty of Paying Living Expenses: Not on file  Food Insecurity:    Worried About Running Out of Food in the Last Year: Not on file   Ran Out of Food in the Last Year: Not on file  Transportation Needs:    Lack of Transportation (Medical): Not on file   Lack of Transportation (Non-Medical): Not on file  Physical Activity:    Days of Exercise per Week: Not on file   Minutes of Exercise per Session: Not on file  Stress:    Feeling of Stress : Not on file  Social Connections:    Frequency of  Communication with Friends and Family: Not on file   Frequency of Social Gatherings with Friends and Family: Not on file   Attends Religious Services: Not on file   Active Member of Clubs or Organizations: Not on file   Attends Banker Meetings: Not on file   Marital Status: Not on file     Family History: The patient's family history includes CAD in her father; Healthy in her sister and sister; Heart attack in her father; Hyperlipidemia in her sister; Sarcoidosis in an other family member.  ROS:   Please see the history of present illness.    ROS  All other systems reviewed and negative.   EKGs/Labs/Other Studies Reviewed:    The following studies were reviewed today: TEE  EKG:  EKG is not ordered  today.    Recent Labs: 05/31/2019: Magnesium 1.9 08/07/2019: Hemoglobin 11.3; Platelets 290 04/04/2020: B Natriuretic Peptide 610.5; BUN 5; Creatinine, Ser 0.71; Potassium 3.3; Sodium 139   Recent Lipid Panel    Component Value Date/Time   CHOL 163 03/16/2019 0225   TRIG 69 03/16/2019 0225   HDL 37 (L) 03/16/2019 0225   CHOLHDL 4.4 03/16/2019 0225   VLDL 14 03/16/2019 0225   LDLCALC 112 (H) 03/16/2019 0225    Physical Exam:    VS:  BP 92/62    Pulse 92    Ht 4\' 11"  (1.499 m)    Wt 121 lb (54.9 kg)    SpO2 96%    BMI 24.44 kg/m     Wt Readings from Last 3 Encounters:  04/22/20 121 lb (54.9 kg)  04/08/20 134 lb 9.6 oz (61.1 kg)  03/28/20 134 lb 9.6 oz (61.1 kg)     GEN:  Well nourished, well developed in no acute distress HEENT: Normal NECK: No JVD; No carotid bruits LYMPHATICS: No lymphadenopathy CARDIAC: RRR, no murmurs, rubs, gallops RESPIRATORY:  Clear to auscultation without rales, wheezing or rhonchi  ABDOMEN: Soft, non-tender, non-distended MUSCULOSKELETAL:  No edema; No deformity  SKIN: Warm and dry NEUROLOGIC:  Alert and oriented x 3 PSYCHIATRIC:  Normal affect   ASSESSMENT:    1. Severe mitral regurgitation   2. Chronic combined  systolic and diastolic heart failure (HCC)   3. Multifocal atrial tachycardia (HCC)   4. Essential hypertension   5. Sarcoidosis    PLAN:    In order of problems listed above:  1.  Moderate to severe MR -noted on 2D echo and TEE showed moderate to severe MR with no flow reversal in PV MV leaflets mildly thickened -this appears to be functional MR -will refer to Dr. 03/30/20 for further evaluation for MitraClip procedure>>I do not think we are at the point of needing the procedure done at this time but would like him to follow along.   2.  Chronic systolic CHF -she does not appear volume overloaded on exam today -weight has decreased 13lbs in the past 2 weeks -ECHO 03/2019 EF 30-35% . Myoview - no ischemia. 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 RV normal. Minimal LGE -> doubt significant cardiac sarcoid -Echo 12/20 EF ~45% MV appears thickened and possible rheumatic with 3+ MR. Severe LAE. Mod TR -TEE with EF 40-45% and moderate to severe MR -Stable NYHA II -continue Entresto 97-103mg  BID, Toprol XL 50mg  daily and spiro 12.5mg  daily -continue lasix 40mg  daily -continue on compression hose  3.  MAT -she appears to be in NSR today -Zio in 9/20       1. Sinus rhythm - avg HR of 82      2. 22 Supraventricular Tachycardia runs occurred, the run with the fastest interval lasting 5 beats with a max rate of 184 bpm, the longest lasting 10.9 secs with an avg rate of 162 bpm.     3. Isolated PACs were occasional (4.7%, 18936)     4. Rare PVCs (<1.0%) -denies any palpitations -continue Toprol XL 50mg  daily  4.  HTN -BP controlled on exam -continue Toprol, Entresto, spiro   5.  Sarcoidosis -followed by Dermatology and Pulmonary  6.  Aortic Atherosclerosis -LDL was at goal at 69 last fall -continue on statin   Medication Adjustments/Labs and Tests Ordered: Current medicines are reviewed at length with the patient today.  Concerns regarding medicines are outlined  above.  Orders Placed This Encounter  Procedures   EKG 12-Lead   No orders of the defined types were placed in this encounter.   Signed, Armanda Magic, MD  04/22/2020 11:33 AM     [facility]

## 2020-04-23 ENCOUNTER — Other Ambulatory Visit (HOSPITAL_COMMUNITY): Payer: Self-pay | Admitting: Internal Medicine

## 2020-04-23 LAB — BASIC METABOLIC PANEL
BUN/Creatinine Ratio: 15 (ref 12–28)
BUN: 13 mg/dL (ref 8–27)
CO2: 24 mmol/L (ref 20–29)
Calcium: 9.4 mg/dL (ref 8.7–10.3)
Chloride: 104 mmol/L (ref 96–106)
Creatinine, Ser: 0.85 mg/dL (ref 0.57–1.00)
GFR calc Af Amer: 78 mL/min/{1.73_m2} (ref >59)
GFR calc non Af Amer: 67 mL/min/{1.73_m2} (ref >59)
Glucose: 93 mg/dL (ref 65–99)
Potassium: 3.8 mmol/L (ref 3.5–5.2)
Sodium: 142 mmol/L (ref 134–144)

## 2020-04-23 LAB — PRO B NATRIURETIC PEPTIDE: NT-Pro BNP: 147 pg/mL (ref 0–738)

## 2020-05-02 ENCOUNTER — Ambulatory Visit (HOSPITAL_COMMUNITY)
Admission: RE | Admit: 2020-05-02 | Discharge: 2020-05-02 | Disposition: A | Discharge status: Home / Self Care | Payer: Self-pay | Source: Ambulatory Visit | Attending: Cardiology | Admitting: Cardiology

## 2020-05-02 ENCOUNTER — Encounter (HOSPITAL_COMMUNITY): Payer: Self-pay

## 2020-05-02 ENCOUNTER — Other Ambulatory Visit: Payer: Self-pay

## 2020-05-02 VITALS — BP 94/76 | HR 78 | Wt 123.0 lb

## 2020-05-02 DIAGNOSIS — Z8249 Family history of ischemic heart disease and other diseases of the circulatory system: Secondary | ICD-10-CM | POA: Insufficient documentation

## 2020-05-02 DIAGNOSIS — I340 Nonrheumatic mitral (valve) insufficiency: Secondary | ICD-10-CM | POA: Insufficient documentation

## 2020-05-02 DIAGNOSIS — I110 Hypertensive heart disease with heart failure: Secondary | ICD-10-CM | POA: Insufficient documentation

## 2020-05-02 DIAGNOSIS — I5042 Chronic combined systolic (congestive) and diastolic (congestive) heart failure: Secondary | ICD-10-CM | POA: Insufficient documentation

## 2020-05-02 DIAGNOSIS — Z79899 Other long term (current) drug therapy: Secondary | ICD-10-CM | POA: Insufficient documentation

## 2020-05-02 DIAGNOSIS — D8689 Sarcoidosis of other sites: Secondary | ICD-10-CM | POA: Insufficient documentation

## 2020-05-02 DIAGNOSIS — I471 Supraventricular tachycardia: Secondary | ICD-10-CM | POA: Insufficient documentation

## 2020-05-02 DIAGNOSIS — M8580 Other specified disorders of bone density and structure, unspecified site: Secondary | ICD-10-CM | POA: Insufficient documentation

## 2020-05-02 DIAGNOSIS — N3010 Interstitial cystitis (chronic) without hematuria: Secondary | ICD-10-CM | POA: Insufficient documentation

## 2020-05-02 LAB — BASIC METABOLIC PANEL
Anion gap: 12 (ref 5–15)
BUN: 14 mg/dL (ref 8–23)
CO2: 24 mmol/L (ref 22–32)
Calcium: 9.8 mg/dL (ref 8.9–10.3)
Chloride: 103 mmol/L (ref 98–111)
Creatinine, Ser: 0.98 mg/dL (ref 0.44–1.00)
GFR calc Af Amer: >60 mL/min (ref >60)
GFR calc non Af Amer: 56 mL/min — ABNORMAL LOW (ref >60)
Glucose, Bld: 100 mg/dL — ABNORMAL HIGH (ref 70–99)
Potassium: 3.8 mmol/L (ref 3.5–5.1)
Sodium: 139 mmol/L (ref 135–145)

## 2020-05-02 LAB — BRAIN NATRIURETIC PEPTIDE: B Natriuretic Peptide: 83.6 pg/mL (ref 0.0–100.0)

## 2020-05-02 MED ORDER — SPIRONOLACTONE 25 MG PO TABS: 25.0000 mg | ORAL_TABLET | Freq: Every day | ORAL | 3 refills | Status: DC

## 2020-05-02 MED ORDER — FUROSEMIDE 20 MG PO TABS: 40.0000 mg | ORAL_TABLET | Freq: Every day | ORAL | 3 refills | Status: DC

## 2020-05-02 MED ORDER — ATORVASTATIN CALCIUM 20 MG PO TABS: 20.0000 mg | ORAL_TABLET | Freq: Every day | ORAL | 3 refills | Status: DC

## 2020-05-02 MED ORDER — METOPROLOL SUCCINATE ER 50 MG PO TB24: 50.0000 mg | ORAL_TABLET | Freq: Every day | ORAL | 6 refills | Status: DC

## 2020-05-02 NOTE — Progress Notes (Signed)
ADVANCED HF CLINIC NOTE  Referring Physician: Dr Eden Emms  Primary Care: Primary Cardiologist:Dr Eden Emms   Reason for visit: Follow-up for chronic systolic heart failure mitral regurgitation  HPI: Robin Estes is a 74 year old initially referred to the HF clinic by Dr Eden Emms for HF consultation.   74 year old with a history of interstitial cystitis, sarcoidosis (eye, lung, and skin), osteopenia, MAT, HTN,MR, pulmonary nodules on CT 03/2019, and recently diagnosed systolic heart failure. Followed by dermatology for sarcoid.   In July 2020 she was admitted to [facility] with increased shortness breath. EKG showed A fib/MAT. Started on diltiazem drip and later swtiched to toprol xl. ECHO showed reduced EF 30-35%  and mod-severe MR. Diuresed with IV lasix and transitioned to po.  Started on HF medications.   She was seen by Dr. Gala Romney last month and was volume overloaded with lower extremity edema. Her Lasix was increased to 40 mg daily. She was also set up for TEE to better assess her mitral valve. Study showed moderate to severe functional central MR. LVEF 40-45% w/ mild global HK. She has been referred to Dr. Royann Shivers for potential [facility]. Her initial consultation with him is later this month,  She presents to clinic today for follow-up. She is here with her daughter. Her weight is down 11 pounds. Lower extremity edema resolved. She denies any significant exertional dyspnea, however her daughter reports that she is not very active at baseline. She sits around the house most of the day and is dependent on her husband to do things for her, even though she is physically capable to do more things for herself. She denies resting dyspnea. No orthopnea or PND. Her blood pressure is a little soft today but no orthostatic symptoms.     Review of systems complete and found to be negative unless listed in HPI.     Cardiac studies Echo 12/20 EF ~45% MV appears thickened and possible rheumatic with 3+ MR.  Severe LAE. Mod TR. Personally reviewed  ECHO 03/2019-  30-35% , RV normal, MV mod-severe MR.   Myoview 03/2019: no ischemia, moderate LV dysfunction 30%   TEE 8/21: moderate to severe functional central MR. LVEF 40-45% w/ mild global HK.  cMRI 04/24/19  1.  Hilar lymphadenopathy consistent with sarcoidosis diagnosis.  2. Mildly dilated LV with EF 45%, there was mild diffuse hypokinesis with severe mid inferior hypokinesis.  3.  Normal RV size and systolic function, EF 51%.  4. Mitral regurgitation appeared severe (flow sequences to quantify were not done).  5. Possible subtle mid-wall LGE in the mid inferior wall (not seen on long axis views). This could be consistent with cardiac sarcoidosis. This is not consistent with prior MI.  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 30 tablet 6   furosemide (LASIX) 20 MG tablet Take 2 tablets (40 mg total) by mouth daily. 30 tablet 6   guaiFENesin (MUCINEX) 600 MG 12 hr tablet Take 600 mg by mouth daily.     metoprolol succinate (TOPROL-XL) 50 MG 24 hr tablet Take 1 tablet  by mouth once daily 30 tablet 0   Multiple Vitamins-Minerals (MULTIVITAMIN WITH MINERALS) tablet Take 1 tablet by mouth daily.     potassium chloride SA (KLOR-CON) 20 MEQ tablet Take 1 tablet (20 mEq total) by mouth daily. 30 tablet 6   sacubitril-valsartan (ENTRESTO) 97-103 MG Take 1 tablet by mouth 2 (two) times daily. 60 tablet 3   spironolactone (ALDACTONE) 25 MG tablet Take 1 tablet (25 mg total) by mouth daily. Needs appt for further refills 90 tablet 0   No current facility-administered medications for this encounter.   Facility-Administered Medications Ordered in Other Encounters  Medication Dose Route Frequency Provider Last Rate Last Admin   bupivacaine (MARCAINE) 0.5 % 10 mL, triamcinolone acetonide (KENALOG-40) 40 mg injection   Subcutaneous Once Jethro Bolus, MD       bupivacaine (MARCAINE) 0.5 % 15 mL, phenazopyridine (PYRIDIUM) 400 mg bladder mixture   Bladder Instillation Once Jethro Bolus, MD        Allergies  Allergen Reactions   Cinnamon Other (See Comments)    MOUTH ULCERS      Social History   Socioeconomic History   Marital status: Married    Spouse name: Not on file   Number of children: Not on file   Years of education: Not on file   Highest education level: Not on file  Occupational History   Not on file  Tobacco Use   Smoking status: Never Smoker   Smokeless tobacco: Never Used  Substance and Sexual Activity   Alcohol use: No   Drug use: No   Sexual activity: Not on file  Other Topics Concern   Not on file  Social History Narrative   Not on file   Social Determinants of Health   Financial Resource Strain:    Difficulty of Paying Living Expenses: Not on file  Food Insecurity:    Worried About Running Out of Food in the Last Year: Not on file   Ran Out of Food in the Last Year: Not on file  Transportation Needs:    Lack of Transportation (Medical): Not on file   Lack of Transportation (Non-Medical): Not  on file  Physical Activity:    Days of Exercise per Week: Not on file   Minutes of Exercise per Session: Not on file  Stress:    Feeling of Stress : Not on file  Social Connections:    Frequency of Communication with Friends and Family: Not on file   Frequency of Social Gatherings with Friends and Family: Not on file   Attends Religious Services: Not on file   Active Member of Clubs or Organizations: Not on file   Attends Banker Meetings: Not on file   Marital Status: Not on file  Intimate Partner Violence:    Fear of Current or Ex-Partner: Not on file   Emotionally Abused: Not on file   Physically Abused: Not on file   Sexually Abused: Not on file  Family History  Problem Relation Age of Onset   CAD Father    Heart attack Father    Sarcoidosis Other    Healthy Sister    Hyperlipidemia Sister    Healthy Sister     Vitals:   05/02/20 1155  BP: 94/76  Pulse: 78  SpO2: 99%  Weight: 55.8 kg (123 lb)   PHYSICAL EXAM: General: Petite/elderly female, well-appearing. No respiratory difficulty HEENT: normal Neck: supple. no JVD. Carotids 2+ bilat; no bruits. No lymphadenopathy or thyromegaly appreciated. Cor: PMI nondisplaced. Regular rate & rhythm. No rubs or gallops. +2/6 murmur at apex Lungs: clear  Abdomen: soft, nontender, nondistended. No hepatosplenomegaly. No bruits or masses. Good bowel sounds. Extremities: no cyanosis, clubbing, rash, edema Neuro: alert & oriented x 3, cranial nerves grossly intact. moves all 4 extremities w/o difficulty. Affect pleasant.   ASSESSMENT & PLAN:  1. Chronic Systolic Heart Failure - ECHO 05/3266 EF 30-35% . Myoview - no ischemia. 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 RV normal. Minimal LGE -> doubt significant cardiac sarcoid - Echo 12/20 EF ~45% MV appears thickened and possible rheumatic with 3+ MR. Severe LAE. Mod TR.  - TEE 8/21: LVEF 40-45% w/ mild global HK andmoderate to  severe functional central MR. -Volume status improved after recent diuretic increase. Her weight is down 11 pounds. No lower extremity edema on exam. Will check repeat BNP for further assessment of volume status, and check BMP to assess renal function and electrolytes. -Continue Lasix 40 mg daily for now. Will decrease dose if needed pending renal function. With soft blood pressure will need to rule out dehydration -She has stable NYHA class II symptoms - Continue Toprol xl 50 mg daily.  - Continue Entresto 97/103 mg twice a day.  - Continue 25 mg spironolactone daily.    2. MAT - On toprol Xl 50 mg daily. RRR on exam today  - Zio in 9/20       1. Sinus rhythm - avg HR of 82      2. 22 Supraventricular Tachycardia runs occurred, the run with the fastest interval lasting 5 beats with a max rate of 184 bpm, the longest lasting 10.9 secs with an avg rate of 162 bpm.     3. Isolated PACs were occasional (4.7%, 18936)     4. Rare PVCs (<1.0%)  3. 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 - She has been referred to Dr. Royann Shivers for possible [facility]  4. 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 Everardo All in Pulmonary. Sarcoid not felt to be active   5. HTN -Controlled on current regimen, a bit soft today but has been stable at home. No orthostatic symptoms. She and her daughter will monitor her blood pressure closely at home and will contact us if she has any persistently low BP -Plan to check BMP today to assess serum creatinine/ BUN   Robbie Lis, PA-C  12:17 PM

## 2020-05-02 NOTE — Patient Instructions (Signed)
It was great to see you today! No medication changes are needed at this time.  Labs today We will only contact you if something comes back abnormal or we need to make some changes. Otherwise no news is good news!  Your physician recommends that you schedule a follow-up appointment in: 8 weeks with Dr Gala Romney  Do the following things EVERYDAY: 1) Weigh yourself in the morning before breakfast. Write it down and keep it in a log. 2) Take your medicines as prescribed 3) Eat low salt foods--Limit salt (sodium) to 2000 mg per day.  4) Stay as active as you can everyday 5) Limit all fluids for the day to less than 2 liters  If you have any questions or concerns before your next appointment please send [place] a message through [place] or call our office at [phone].    TO LEAVE A MESSAGE FOR THE NURSE SELECT OPTION 2, PLEASE LEAVE A MESSAGE INCLUDING: . YOUR NAME . DATE OF BIRTH . CALL BACK NUMBER . REASON FOR CALL**this is important as we prioritize the call backs  YOU WILL RECEIVE A CALL BACK THE SAME DAY AS LONG AS YOU CALL BEFORE 4:00 PM .

## 2020-05-03 ENCOUNTER — Other Ambulatory Visit (HOSPITAL_COMMUNITY): Payer: Self-pay | Admitting: Internal Medicine

## 2020-05-30 ENCOUNTER — Ambulatory Visit: Payer: Self-pay | Admitting: Cardiovascular Disease

## 2020-07-02 NOTE — Progress Notes (Signed)
Patient did not show for visit. Note left for templating purposes only.     ADVANCED HF CLINIC NOTE  Referring Physician: Dr Eden Emms  Primary Care: Primary Cardiologist:Dr Eden Emms   Reason for visit: Follow-up for chronic systolic heart failure mitral regurgitation  HPI: Robin Estes is a 74 year old initially referred to the HF clinic by Dr Eden Emms for HF consultation.   74 year old with a history of interstitial cystitis, sarcoidosis (eye, lung, and skin), osteopenia, MAT, HTN, MR, pulmonary nodules on CT 03/2019, and  systolic heart failure.  In July 2020 she was admitted to [facility] with increased shortness breath. EKG showed A fib/MAT. Started on diltiazem drip and later swtiched to toprol xl. ECHO showed reduced EF 30-35%  and mod-severe MR. Diuresed with IV lasix and transitioned to po. GDMT initiated   Tee 8/21 Study showed LVEF 40-45% w/ mild global HK with moderate to severe functional central MR.. She has been referred to Dr. Excell Seltzer for potential [facility].  She presents to clinic today for follow-up. She is here with her daughter. Her weight is down 11 pounds. Lower extremity edema resolved. She denies any significant exertional dyspnea, however her daughter reports that she is not very active at baseline. She sits around the house most of the day and is dependent on her husband to do things for her, even though she is physically capable to do more things for herself. She denies resting dyspnea. No orthopnea or PND. Her blood pressure is a little soft today but no orthostatic symptoms.     Review of systems complete and found to be negative unless listed in HPI.     Cardiac studies Echo 12/20 EF ~45% MV appears thickened and possible rheumatic with 3+ MR. Severe LAE. Mod TR. Personally reviewed  ECHO 03/2019-  30-35% , RV normal, MV mod-severe MR.   Myoview 03/2019: no ischemia, moderate LV dysfunction 30%   TEE 8/21: moderate to severe functional central MR. LVEF 40-45% w/  mild global HK.  cMRI 04/24/19  1.  Hilar lymphadenopathy consistent with sarcoidosis diagnosis.   2. Mildly dilated LV with EF 45%, there was mild diffuse hypokinesis with severe mid inferior hypokinesis.   3.  Normal RV size and systolic function, EF 51%.   4. Mitral regurgitation appeared severe (flow sequences to quantify were not done).   5. Possible subtle mid-wall LGE in the mid inferior wall (not seen on long axis views). This could be consistent with cardiac sarcoidosis. This is not consistent with prior MI.  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 30 tablet 3   ENTRESTO 97-103 MG Take 1 tablet by mouth twice daily 60 tablet 6   furosemide (LASIX) 20 MG tablet Take 2 tablets (40 mg total) by mouth daily. 60 tablet 3   guaiFENesin (MUCINEX) 600 MG 12 hr tablet Take 600 mg by mouth daily.     metoprolol succinate (TOPROL-XL) 50 MG 24 hr tablet Take 1 tablet (50 mg total) by mouth daily. Take with or immediately following a meal. 30 tablet 6   Multiple Vitamins-Minerals (MULTIVITAMIN WITH MINERALS) tablet Take 1 tablet by mouth daily.      potassium chloride SA (KLOR-CON) 20 MEQ tablet Take 1 tablet (20 mEq total) by mouth daily. 30 tablet 6   spironolactone (ALDACTONE) 25 MG tablet TAKE 1 TABLET BY MOUTH ONCE DAILY *NEEDS APPOINTMENT FOR FURTHER REFILLS* 30 tablet 6   No current facility-administered medications for this encounter.   Facility-Administered Medications Ordered in Other Encounters  Medication Dose Route Frequency Provider Last Rate Last Admin   bupivacaine (MARCAINE) 0.5 % 10 mL, triamcinolone acetonide (KENALOG-40) 40 mg injection   Subcutaneous Once Jethro Bolus, MD       bupivacaine (MARCAINE) 0.5 % 15 mL, phenazopyridine (PYRIDIUM) 400 mg bladder mixture   Bladder Instillation Once Jethro Bolus, MD        Allergies  Allergen Reactions   Cinnamon Other (See Comments)    MOUTH ULCERS      Social History   Socioeconomic History   Marital status: Married    Spouse name: Not on file   Number of children: Not on file   Years of education: Not on file   Highest education level: Not on file  Occupational History   Not on file  Tobacco Use   Smoking status: Never Smoker   Smokeless tobacco: Never Used  Substance and Sexual Activity   Alcohol use: No   Drug use: No   Sexual activity: Not on file  Other Topics Concern   Not on file  Social History Narrative   Not on file   Social Determinants of Health   Financial Resource Strain:    Difficulty of Paying Living Expenses: Not on file  Food Insecurity:    Worried About Running Out of Food in the Last Year: Not on file   Ran Out of Food in the Last Year: Not on file  Transportation Needs:    Lack of Transportation (Medical): Not on file   Lack of Transportation (Non-Medical): Not on file  Physical Activity:    Days of Exercise per Week: Not on file   Minutes of Exercise per Session: Not on file  Stress:    Feeling of Stress : Not on file  Social Connections:    Frequency of Communication with Friends and Family: Not on file    Frequency of Social Gatherings with Friends and Family: Not on file   Attends Religious Services: Not on file   Active Member of Clubs or Organizations: Not on file   Attends Banker Meetings: Not on file   Marital Status: Not on file  Intimate Partner Violence:    Fear of Current or Ex-Partner: Not on file   Emotionally Abused: Not on file   Physically Abused: Not on file   Sexually Abused: Not on file      Family History  Problem Relation Age of Onset   CAD Father    Heart attack Father  Sarcoidosis Other    Healthy Sister    Hyperlipidemia Sister    Healthy Sister     There were no vitals filed for this visit. PHYSICAL EXAM: General: Petite/elderly female, well-appearing. No respiratory difficulty HEENT: normal Neck: supple. no JVD. Carotids 2+ bilat; no bruits. No lymphadenopathy or thyromegaly appreciated. Cor: PMI nondisplaced. Regular rate & rhythm. No rubs or gallops. +2/6 murmur at apex Lungs: clear  Abdomen: soft, nontender, nondistended. No hepatosplenomegaly. No bruits or masses. Good bowel sounds. Extremities: no cyanosis, clubbing, rash, edema Neuro: alert & oriented x 3, cranial nerves grossly intact. moves all 4 extremities w/o difficulty. Affect pleasant.   ASSESSMENT & PLAN:  1. Chronic Systolic Heart Failure - ECHO 01/7618 EF 30-35% . Myoview - no ischemia. 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 RV normal. Minimal LGE -> doubt significant cardiac sarcoid - Echo 12/20 EF ~45% MV appears thickened and possible rheumatic with 3+ MR. Severe LAE. Mod TR.  - TEE 8/21: LVEF 40-45% w/ mild global HK andmoderate to severe functional central MR. -She has stable NYHA class II symptoms -Volume status -Continue Lasix 40 mg daily for now.  - Continue Toprol xl 50 mg daily.  - Continue Entresto 97/103 mg twice a day.  - Continue spirono 25  2. MAT - On toprol Xl 50 mg daily.  - Zio in 9/20       1. Sinus rhythm - avg HR of 82       2. 22 Supraventricular Tachycardia runs occurred, the run with the fastest interval lasting 5 beats with a max rate of 184 bpm, the longest lasting 10.9 secs with an avg rate of 162 bpm.     3. Isolated PACs were occasional (4.7%, 18936)     4. Rare PVCs (<1.0%)  3. 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 - She has been referred for possible Mitra Clip  4. 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 Everardo All in Pulmonary. Sarcoid not felt to be active   5. HTN -Controlled on current regimen, a bit soft today but has been stable at home. No orthostatic symptoms. She and her daughter will monitor her blood pressure closely at home and will contact us if she has any persistently low BP -Plan to check BMP today to assess serum creatinine/ BUN   Robin Meres, MD  10:48 PM

## 2020-07-03 ENCOUNTER — Inpatient Hospital Stay (HOSPITAL_BASED_OUTPATIENT_CLINIC_OR_DEPARTMENT_OTHER)
Admission: RE | Admit: 2020-07-03 | Discharge: 2020-07-03 | Disposition: A | Discharge status: Home / Self Care | Payer: Self-pay | Source: Ambulatory Visit | Attending: Internal Medicine | Admitting: Internal Medicine

## 2020-07-03 DIAGNOSIS — I5022 Chronic systolic (congestive) heart failure: Secondary | ICD-10-CM

## 2020-08-07 ENCOUNTER — Ambulatory Visit: Payer: Self-pay | Admitting: Cardiovascular Disease

## 2020-08-16 ENCOUNTER — Other Ambulatory Visit (HOSPITAL_COMMUNITY): Payer: Self-pay

## 2020-08-16 MED ORDER — FUROSEMIDE 20 MG PO TABS: 40.0000 mg | ORAL_TABLET | Freq: Every day | ORAL | 3 refills | Status: DC

## 2020-08-25 LAB — ECHO TEE
MV M vel: 5.22 m/s
MV Peak grad: 109 mmHg
Radius: 0.4 cm

## 2020-08-31 ENCOUNTER — Other Ambulatory Visit (HOSPITAL_COMMUNITY): Payer: Self-pay | Admitting: Cardiology

## 2020-09-27 ENCOUNTER — Other Ambulatory Visit (HOSPITAL_COMMUNITY): Payer: Self-pay | Admitting: Cardiology

## 2020-11-05 ENCOUNTER — Other Ambulatory Visit (HOSPITAL_COMMUNITY): Payer: Self-pay | Admitting: Internal Medicine

## 2020-11-22 ENCOUNTER — Other Ambulatory Visit (HOSPITAL_COMMUNITY): Payer: Self-pay | Admitting: Internal Medicine

## 2020-12-27 ENCOUNTER — Other Ambulatory Visit (HOSPITAL_COMMUNITY): Payer: Self-pay | Admitting: Internal Medicine

## 2021-02-17 ENCOUNTER — Other Ambulatory Visit (HOSPITAL_COMMUNITY): Payer: Self-pay | Admitting: Cardiology

## 2021-03-16 ENCOUNTER — Other Ambulatory Visit (HOSPITAL_COMMUNITY): Payer: Self-pay | Admitting: Internal Medicine

## 2021-04-18 ENCOUNTER — Other Ambulatory Visit (HOSPITAL_COMMUNITY): Payer: Self-pay

## 2021-04-18 ENCOUNTER — Telehealth (HOSPITAL_COMMUNITY): Payer: Self-pay | Admitting: Pharmacist

## 2021-04-18 ENCOUNTER — Encounter (HOSPITAL_COMMUNITY): Payer: Self-pay | Admitting: Internal Medicine

## 2021-04-18 ENCOUNTER — Ambulatory Visit (HOSPITAL_COMMUNITY)
Admission: RE | Admit: 2021-04-18 | Discharge: 2021-04-18 | Disposition: A | Discharge status: Home / Self Care | Payer: Self-pay | Source: Ambulatory Visit | Attending: Internal Medicine | Admitting: Internal Medicine

## 2021-04-18 ENCOUNTER — Other Ambulatory Visit: Payer: Self-pay

## 2021-04-18 VITALS — BP 100/60 | HR 76 | Wt 137.6 lb

## 2021-04-18 DIAGNOSIS — I471 Supraventricular tachycardia: Secondary | ICD-10-CM | POA: Insufficient documentation

## 2021-04-18 DIAGNOSIS — D869 Sarcoidosis, unspecified: Secondary | ICD-10-CM

## 2021-04-18 DIAGNOSIS — Z79899 Other long term (current) drug therapy: Secondary | ICD-10-CM | POA: Insufficient documentation

## 2021-04-18 DIAGNOSIS — I10 Essential (primary) hypertension: Secondary | ICD-10-CM

## 2021-04-18 DIAGNOSIS — I340 Nonrheumatic mitral (valve) insufficiency: Secondary | ICD-10-CM | POA: Insufficient documentation

## 2021-04-18 DIAGNOSIS — I5042 Chronic combined systolic (congestive) and diastolic (congestive) heart failure: Secondary | ICD-10-CM

## 2021-04-18 DIAGNOSIS — I4719 Other supraventricular tachycardia: Secondary | ICD-10-CM

## 2021-04-18 DIAGNOSIS — I110 Hypertensive heart disease with heart failure: Secondary | ICD-10-CM | POA: Insufficient documentation

## 2021-04-18 LAB — BASIC METABOLIC PANEL
Anion gap: 9 (ref 5–15)
BUN: 12 mg/dL (ref 8–23)
CO2: 23 mmol/L (ref 22–32)
Calcium: 9.3 mg/dL (ref 8.9–10.3)
Chloride: 107 mmol/L (ref 98–111)
Creatinine, Ser: 1 mg/dL (ref 0.44–1.00)
GFR, Estimated: 58 mL/min — ABNORMAL LOW (ref >60)
Glucose, Bld: 100 mg/dL — ABNORMAL HIGH (ref 70–99)
Potassium: 3.8 mmol/L (ref 3.5–5.1)
Sodium: 139 mmol/L (ref 135–145)

## 2021-04-18 LAB — BRAIN NATRIURETIC PEPTIDE: B Natriuretic Peptide: 124.9 pg/mL — ABNORMAL HIGH (ref 0.0–100.0)

## 2021-04-18 MED ORDER — DAPAGLIFLOZIN PROPANEDIOL 10 MG PO TABS: 10.0000 mg | ORAL_TABLET | Freq: Every day | ORAL | 6 refills | Status: DC

## 2021-04-18 MED ORDER — FUROSEMIDE 20 MG PO TABS: 20.0000 mg | ORAL_TABLET | Freq: Every day | ORAL | Status: DC | PRN

## 2021-04-18 NOTE — Progress Notes (Signed)
ADVANCED HF CLINIC NOTE  Referring Physician: Dr Eden Emms  Primary Care: Primary Cardiologist:Dr Eden Emms   Reason for visit: Follow-up for chronic systolic heart failure, mitral regurgitation  HPI: Robin Estes is a 75 yo woman referred to the HF clinic by Dr Eden Emms for HF consultation.   History of interstitial cystitis, sarcoidosis (eye, lung, and skin), osteopenia, MAT, HTN, MR, pulmonary nodules on CT 03/2019, and  systolic heart failure.  In July 2020 she was admitted to [facility] with increased shortness breath. EKG showed A fib/MAT. ECHO  EF 30-35%  and mod-severe MR.GDMT initiated   TEE 8/21 LVEF 40-45% w/ mild global HK with moderate to severe functional central MR.Marland Kitchen Referred to Dr. Excell Seltzer for consideration of [facility] but did not keep the appointment d/t gap in insurance coverage.  Last seen in September 2021. Appeared stable from HF standpoint.   She is here today for routine f/u. Has been doing well. Her daughter is present and provides history. No CP, dyspnea, orthopnea, PND or leg edema. She decreased furosemide from 40 mg to 20 mg daily a few months ago. Takes extra furosemide as needed for LE edema. Weight has been stable between 134-137 lb recently.  Was 123 lb at last visit, 137 lb in clinic today. She keeps active watching her 46 month old great granddaughter during the day.   Reports compliance with medications. Daughter assists with pill boxes. No adverse side effects. Has been paying out of pocket for entresto d/t lack of insurance.  Review of systems complete and found to be negative unless listed in HPI.     Cardiac studies Echo 12/20 EF ~45% MV appears thickened and possible rheumatic with 3+ MR. Severe LAE. Mod TR. Personally reviewed  ECHO 03/2019-  30-35% , RV normal, MV mod-severe MR.   Myoview 03/2019: no ischemia, moderate LV dysfunction 30%   TEE 8/21: moderate to severe functional central MR. LVEF 40-45% w/ mild global HK.  cMRI 04/24/19  1.  Hilar  lymphadenopathy consistent with sarcoidosis diagnosis.   2. Mildly dilated LV with EF 45%, there was mild diffuse hypokinesis with severe mid inferior hypokinesis.   3.  Normal RV size and systolic function, EF 51%.   4. Mitral regurgitation appeared severe (flow sequences to quantify were not done).   5. Possible subtle mid-wall LGE in the mid inferior wall (not seen on long axis views). This could be consistent with cardiac sarcoidosis. This is not consistent with prior MI.  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  30 tablet 8   furosemide (LASIX) 20 MG tablet Take 20 mg by mouth daily.     guaiFENesin (MUCINEX) 600 MG 12 hr tablet Take 600 mg by mouth daily.     metoprolol succinate (TOPROL-XL) 50 MG 24 hr tablet TAKE 1 TABLET BY MOUTH ONCE DAILY *TAKE WITH OR IMMEDIATELY FOLLOWING A MEAL* 30 tablet 2   Multiple Vitamins-Minerals (MULTIVITAMIN WITH MINERALS) tablet Take 1 tablet by mouth daily.     potassium chloride SA (KLOR-CON) 20 MEQ tablet TAKE 1 TABLET BY MOUTH ONCE DAILY . APPOINTMENT REQUIRED FOR FUTURE REFILLS 30 tablet 6    sacubitril-valsartan (ENTRESTO) 97-103 MG Take 1 tablet by mouth 2 (two) times daily. Needs appt for further refills 60 tablet 2   spironolactone (ALDACTONE) 25 MG tablet Take 1 tablet (25 mg total) by mouth daily. 90 tablet 3   No current facility-administered medications for this encounter.   Facility-Administered Medications Ordered in Other Encounters  Medication Dose Route Frequency Provider Last Rate Last Admin   bupivacaine (MARCAINE) 0.5 % 10 mL, triamcinolone acetonide (KENALOG-40) 40 mg injection   Subcutaneous Once Jethro Bolus, MD       bupivacaine (MARCAINE) 0.5 % 15 mL, phenazopyridine (PYRIDIUM) 400 mg bladder mixture   Bladder Instillation Once Jethro Bolus, MD        Allergies  Allergen Reactions   Cinnamon Other (See Comments)    MOUTH ULCERS      Social History   Socioeconomic History   Marital status: Married    Spouse name: Not on file   Number of children: Not on file   Years of education: Not on file   Highest education level: Not on file  Occupational History   Not on file  Tobacco Use   Smoking status: Never   Smokeless tobacco: Never  Substance and Sexual Activity   Alcohol use: No   Drug use: No   Sexual activity: Not on file  Other Topics Concern   Not on file  Social History Narrative   Not on file   Social Determinants of Health   Financial Resource Strain: Not on file  Food Insecurity: Not on file  Transportation Needs: Not on file  Physical Activity: Not on file  Stress: Not on file  Social Connections: Not on file  Intimate Partner Violence: Not on file      Family History  Problem Relation Age of Onset   CAD Father    Heart attack Father    Sarcoidosis Other    Healthy Sister    Hyperlipidemia Sister    Healthy Sister     Vitals:   04/18/21 1138  BP: 100/60  Pulse: 76  SpO2: 99%  Weight: 62.4 kg (137 lb 9.6 oz)   General:  Well appearing, elderly female. No resp difficulty. Daughter present HEENT:  normal Neck: supple. no JVD. Carotids 2+ bilat; no bruits. No lymphadenopathy or thryomegaly appreciated. Cor: PMI nondisplaced. Regular rate & rhythm. No rubs, gallops, 2/6 systolic murmur at apex Lungs: clear Abdomen: soft, nontender, nondistended. No hepatosplenomegaly. No bruits or masses. Good bowel sounds. Extremities: no cyanosis, clubbing, rash, edema Neuro: alert & orientedx3, cranial nerves grossly intact. moves all 4 extremities w/o difficulty. Affect pleasant    ASSESSMENT & PLAN:  1. Chronic Systolic Heart Failure - ECHO 08/313 EF 30-35% . Myoview - no ischemia. 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 RV normal. Minimal LGE -> doubt significant cardiac sarcoid - Echo 12/20 EF ~45% MV  appears thickened and possible rheumatic with 3+ MR. Severe LAE. Mod TR.  - TEE 8/21: LVEF 40-45% w/ mild global HK andmoderate to severe functional central MR. - She has stable NYHA class II symptoms - Weight up from last visit but appears compensated. - Continue Toprol xl 50 mg daily.  - Continue Entresto 97/103 mg twice a day. Discussed assistance options with PharmD to cover patient until she can get insurance. - Continue spiro 25 mg daily - Given 30 day free card for Farxiga 10 mg daily and working on assistance applications. - Reduce furosemide to 20 mg PRN only once start Farxiga - BMP/BNP today - Repeat echo - Will reach out to our HF social worker to assist with obtaining insurance.  2. MAT - On toprol Xl 50 mg daily.  - NSR on ECG today - Zio in 9/20       1. Sinus rhythm - avg HR of 82      2. 22 Supraventricular Tachycardia runs occurred, the run with the fastest interval lasting 5 beats with a max rate of 184 bpm, the longest lasting 10.9 secs with an avg rate of 162 bpm.     3. Isolated PACs were occasional (4.7%, 18936)     4. Rare PVCs (<1.0%)  3. 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 - She had been referred for  possible [employer]. Did not keep appointment last fall due to gap in insurance coverage - Repeat echo  4. 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 Everardo All in Pulmonary. Sarcoid not felt to be active   5. HTN -BP stable.  F/u: 3-4 months  Robin Estes, Robin N, PA-C  12:08 PM   Patient seen and examined with the above-signed Advanced Practice Provider and/or Housestaff. I personally reviewed laboratory data, imaging studies and relevant notes. I independently examined the patient and formulated the important aspects of the plan. I have edited the note to reflect any of my changes or salient points. I have personally discussed the plan with the patient and/or family.  Continues to improve with aggressive medical therapy. NYHA II. Volume status looks. Good. Having trouble affording meds. Did not make her appt with Dr. Excell Seltzer for MitraClip eval.   General:  Elderly woman No resp difficulty HEENT: normal Neck: supple. no JVD. Carotids 2+ bilat; no bruits. No lymphadenopathy or thryomegaly appreciated. Cor: PMI nondisplaced. Regular rate & rhythm. No rubs, gallops or murmurs. Lungs: clear Abdomen: soft, nontender, nondistended. No hepatosplenomegaly. No bruits or masses. Good bowel sounds. Extremities: no cyanosis, clubbing, rash, edema Neuro: alert & orientedx3, cranial nerves grossly intact. moves all 4 extremities w/o difficulty. Affect pleasant  Improved NYHA II. Volume status stable. I do not hear much MR on exam. Will add [name] and have SW and PharmD see her to help with [place] and [name]. Switch lasix to prn only. Will repeat echo to reassess MR,   Check labs today.   Arvilla Meres, MD  8:58 AM

## 2021-04-18 NOTE — Patient Instructions (Signed)
Take Furosemide ONLY AS NEEDED  Start Farxiga 10 mg Daily  Your provider has prescribed Marcelline Deist for you. Please be aware the most common side effect of this medication is urinary tract infections and yeast infections. Please practice good hygiene and keep this area clean and dry to help prevent this. If you do begin to have symptoms of these infections, such as difficulty urinating or painful urination,  please let us know.  Labs done today, we will call you for abnormal results  Your physician has requested that you have an echocardiogram. Echocardiography is a painless test that uses sound waves to create images of your heart. It provides your doctor with information about the size and shape of your heart and how well your heart's chambers and valves are working. This procedure takes approximately one hour. There are no restrictions for this procedure.  Your physician recommends that you schedule a follow-up appointment in: 3-4 months  Do the following things EVERYDAY: Weigh yourself in the morning before breakfast. Write it down and keep it in a log. Take your medicines as prescribed Eat low salt foods--Limit salt (sodium) to 2000 mg per day.  Stay as active as you can everyday Limit all fluids for the day to less than 2 liters  If you have any questions or concerns before your next appointment please send [place] a message through [employer] or call our office at [phone].    TO LEAVE A MESSAGE FOR THE NURSE SELECT OPTION 2, PLEASE LEAVE A MESSAGE INCLUDING: YOUR NAME DATE OF BIRTH CALL BACK NUMBER REASON FOR CALL**this is important as we prioritize the call backs  YOU WILL RECEIVE A CALL BACK THE SAME DAY AS LONG AS YOU CALL BEFORE 4:00 PM  milAt the [facility], you and your health needs are our priority. As part of our continuing mission to provide you with exceptional heart care, we have created designated Provider Care Teams. These Care Teams include your primary  Cardiologist (physician) and Advanced Practice Providers (APPs- Physician Assistants and Nurse Practitioners) who all work together to provide you with the care you need, when you need it.   You may see any of the following providers on your designated Care Team at your next follow up: Dr Arvilla Meres Dr Marca Ancona Dr Brandon Melnick, NP Robbie Lis, [place] Mikki Santee Karle Plumber, PharmD   Please be sure to bring in all your medications bottles to every appointment.

## 2021-04-18 NOTE — Telephone Encounter (Signed)
Sent in Manufacturer's Assistance application to [employer] for Farxiga.    Application pending, will continue to follow.   Carlean Crowl, PharmD, BCPS, BCCP, CPP Heart Failure Clinic Pharmacist [phone]  

## 2021-04-18 NOTE — Telephone Encounter (Signed)
Sent in Manufacturer's Assistance application to [employer] for Entresto.    Application pending, will continue to follow.  Allyssia Skluzacek, PharmD, BCPS, BCCP, CPP [facility] Failure Clinic Pharmacist [phone]  

## 2021-04-23 ENCOUNTER — Telehealth: Payer: Self-pay | Admitting: Licensed Clinical Social Worker

## 2021-04-23 NOTE — Progress Notes (Signed)
Heart and Vascular Care Navigation  04/23/2021  Robin Estes 04-Mar-1946 [id number]  Reason for Referral:  Engaged with patient by telephone for initial visit for Heart and Vascular Care Coordination.                                                                                                   Assessment:              LCSW team received referral for pt this afternoon as she does not currently have adequate insurance to meet all of her ongoing care needs. Appreciate pharmacy team's assistance with getting pt applications submitted for her Robin Estes and Robin Estes. I attempted to reach pt daughter after clarifying with pharmacist that pt daughter assists with care needs.                            Attempted to reach Robin Estes at preferred listed number [phone]). Person who answered shares that she is not there, did not give me their name but states they will have her call me back.   LCSW then received the following number from pharmacy team for pt daughter: [phone]. Pt daughter answered, I introduced self, role, reason for call. Pt daughter confirms other number attempted is her home number and that was likely her husband picking up. She is currently getting ready for open house at work but was able to assist me with some basic information.   Pt daughter confirms home address, and that pt/pt husband live alone there. They own their car and home and only pay taxes at this time. Pt previously had full medical and pharmacy coverage. I was not able to follow the full story shared but apparently, there was a challenge getting in patient home to keep bills up to date during a period of illness and therefore pt coverage went unpaid and now she only has Medicare part A. It also appears pt likely also had some coverage under her husbands previous insurance [name]) through work. 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 She manages her home expenses okay, no outstanding bills or issues with getting  utilities or other basic needs. Medications and ongoing medical care though have been increasingly expensive.   LCSW was able to briefly share that a [facility] counselor may be able to advise pt daughter about what options exist for pt to enroll in part B and a prescription plan. I do not think at this time that pt qualifies for Medicaid given current monthly income of pt and pt husband.     HRT/VAS Care Coordination     Patients Home Cardiology Office [facility] Failure [facility]   Outpatient Care Team Social Worker   Living arrangements for the past 2 months Single Family Home   Lives with: Spouse   Patient Current Insurance Coverage Traditional Medicare  Medicare Pt A only at this time   Patient Has Concern With Paying Medical Bills Yes   Patient Concerns With Medical Bills only has Medicare pt A at this time, no pt B or prescription coverage  Medical Bill Referrals: referred to [facility] counselors   Does Patient Have Prescription Coverage? No   Patient Prescription Assistance Programs Patient Assistance Programs   Home Assistive Devices/Equipment Eyeglasses   DME Agency NA       Social History:                                                                             SDOH Screenings   Alcohol Screen: Not on file  Depression (PHQ2-9): Not on file  Financial Resource Strain: High Risk   Difficulty of Paying Living Expenses: Hard  Food Insecurity: No Food Insecurity   Worried About Running Out of Food in the Last Year: Never true   Ran Out of Food in the Last Year: Never true  Housing: Low Risk    Last Housing Risk Score: 0  Physical Activity: Not on file  Social Connections: Not on file  Stress: Not on file  Tobacco Use: Low Risk    Smoking Tobacco Use: Never   Smokeless Tobacco Use: Never  Transportation Needs: No Transportation Needs   Lack of Transportation (Medical): No   Lack of Transportation (Non-Medical): No    SDOH Interventions: Financial Resources:  Financial Strain  Interventions: Other (Comment) [facility] Program, Pharmacy has assisted w/ assistance applications in the meantime)   Food Insecurity:  Food Insecurity Interventions: Intervention Not Indicated  Housing Insecurity:  Housing Interventions: Intervention Not Indicated  Transportation:   Transportation Interventions: Intervention Not Indicated     Other Care Navigation Interventions:     Provided Pharmacy assistance resources Patient Assistance Programs   Follow-up plan:   Pt daughter had to finish conversation early since the open house was starting but we agreed to speak around 10am tomorrow morning. I will place [employer] information in the mail as well to pt house as requested.

## 2021-04-24 ENCOUNTER — Other Ambulatory Visit (HOSPITAL_COMMUNITY): Payer: Self-pay

## 2021-04-24 ENCOUNTER — Telehealth: Payer: Self-pay | Admitting: Licensed Clinical Social Worker

## 2021-04-24 NOTE — Telephone Encounter (Signed)
LCSW received a call back from pt daughter this afternoon while on phone with another patient. Unfortunately, when I returned call pt daughter did not answer. Additional message left. Will reattempt tomorrow.   Octavio Graves, MSW, LCSW [facility] Heart/Vascular Care Navigation  [phone]

## 2021-04-24 NOTE — Telephone Encounter (Signed)
Advanced Heart Failure Patient Advocate Encounter  Patient was approved to receive Entresto from [employer]  Patient ID: [id number] Effective dates: 04/22/21 through 08/30/21  Called and left the patient's daughter a message detailing how to get a refill from [employer].  Called [employer] to check the status of the patient's application. The phone lines are down until 08/31 and then will re-open. Can call and check the status at that time, if no approval comes before then.

## 2021-04-24 NOTE — Telephone Encounter (Signed)
Attempted to reach pt daughter [name] via telephone at [phone].  No answer, left voicemail. In the meantime have mailed [facility] information as requested.   Octavio Graves, MSW, LCSW [facility] Heart/Vascular Care Navigation  [phone]

## 2021-04-25 ENCOUNTER — Telehealth: Payer: Self-pay | Admitting: Licensed Clinical Social Worker

## 2021-04-25 NOTE — Telephone Encounter (Signed)
LCSW was able to reach pt daughter [name] this morning via telephone at [phone]. [name] appreciative of f/u call. We discussed again [facility] counseling and how to reach Darlyne Russian and his team at [employer]. Pt daughter aware to inquire about Part B enrollment and how to look into a prescription coverage option so that moving forward she has the coverage she will need.    She confirms they were able to get Entresto shipped but there was some discrepency as to whether or not she was on the account as an authorized rep so in the future she can assist with her mother's refills as needed. I encouraged her to call [employer] PAP to clarify. She is aware [employer]cell[employer] Deist app was also sent in last week- unclear the status on that at this time.   Pt daughter requested also for [facility] number to be texted to her phone [phone]) which I have done at this time. Remain available for any additional questions/concerns that may arise.    Octavio Graves, MSW, LCSW [facility] Heart/Vascular Care Navigation  [phone]

## 2021-05-02 ENCOUNTER — Other Ambulatory Visit (HOSPITAL_COMMUNITY): Payer: Self-pay | Admitting: Internal Medicine

## 2021-05-06 ENCOUNTER — Telehealth (HOSPITAL_COMMUNITY): Payer: Self-pay | Admitting: Pharmacy Technician

## 2021-05-06 NOTE — Telephone Encounter (Signed)
Advanced Heart Failure Patient Advocate Encounter  Patient's daughter called and left a message stating that her mom was having issues getting Entresto and wanted samples. Called and spoke with patient's daughter. Provided the number to [employer], assured her we could get samples while she waits for the shipment to arrive.   Medication Samples have been provided to the patient.  Drug name: Robin Estes       Strength: 49/51mg         Qty: 1 bottle  LOT: [id number]  Exp.Date: 07/24  Dosing instructions: Take 2 tablets by mouth twice daily.  The patient has been instructed regarding the correct time, dose, and frequency of taking this medication, including desired effects and most common side effects.   [facility] 3:58 PM 05/06/2021

## 2021-05-15 NOTE — Telephone Encounter (Signed)
Advanced Heart Failure Patient Advocate Encounter   Patient was approved to receive Farxiga from [employer]  Patient ID: [id number] Effective dates: 04/23/21 through 04/22/22  First shipment was mailed to the patient on 04/25/21.  Archer Asa, CPhT

## 2021-06-12 ENCOUNTER — Other Ambulatory Visit (HOSPITAL_COMMUNITY): Payer: Self-pay | Admitting: Cardiology

## 2021-07-17 ENCOUNTER — Inpatient Hospital Stay (HOSPITAL_COMMUNITY)
Admission: EM | Admit: 2021-07-17 | Discharge: 2021-07-28 | DRG: 224 | Disposition: A | Discharge status: Home Health | Payer: Medicare Other | Attending: Internal Medicine | Admitting: Internal Medicine

## 2021-07-17 ENCOUNTER — Other Ambulatory Visit: Payer: Self-pay

## 2021-07-17 ENCOUNTER — Encounter (HOSPITAL_COMMUNITY): Payer: Self-pay | Admitting: Emergency Medicine

## 2021-07-17 DIAGNOSIS — I110 Hypertensive heart disease with heart failure: Secondary | ICD-10-CM | POA: Diagnosis present

## 2021-07-17 DIAGNOSIS — N170 Acute kidney failure with tubular necrosis: Secondary | ICD-10-CM | POA: Diagnosis not present

## 2021-07-17 DIAGNOSIS — T380X5A Adverse effect of glucocorticoids and synthetic analogues, initial encounter: Secondary | ICD-10-CM | POA: Diagnosis not present

## 2021-07-17 DIAGNOSIS — Z23 Encounter for immunization: Secondary | ICD-10-CM

## 2021-07-17 DIAGNOSIS — Z83438 Family history of other disorder of lipoprotein metabolism and other lipidemia: Secondary | ICD-10-CM

## 2021-07-17 DIAGNOSIS — I340 Nonrheumatic mitral (valve) insufficiency: Secondary | ICD-10-CM | POA: Diagnosis present

## 2021-07-17 DIAGNOSIS — J9601 Acute respiratory failure with hypoxia: Secondary | ICD-10-CM | POA: Diagnosis present

## 2021-07-17 DIAGNOSIS — R55 Syncope and collapse: Secondary | ICD-10-CM | POA: Diagnosis not present

## 2021-07-17 DIAGNOSIS — I469 Cardiac arrest, cause unspecified: Secondary | ICD-10-CM | POA: Diagnosis present

## 2021-07-17 DIAGNOSIS — I4901 Ventricular fibrillation: Principal | ICD-10-CM | POA: Diagnosis present

## 2021-07-17 DIAGNOSIS — Z20822 Contact with and (suspected) exposure to covid-19: Secondary | ICD-10-CM | POA: Diagnosis present

## 2021-07-17 DIAGNOSIS — D863 Sarcoidosis of skin: Secondary | ICD-10-CM | POA: Diagnosis present

## 2021-07-17 DIAGNOSIS — Z978 Presence of other specified devices: Secondary | ICD-10-CM | POA: Diagnosis present

## 2021-07-17 DIAGNOSIS — Z8249 Family history of ischemic heart disease and other diseases of the circulatory system: Secondary | ICD-10-CM

## 2021-07-17 DIAGNOSIS — I462 Cardiac arrest due to underlying cardiac condition: Secondary | ICD-10-CM | POA: Diagnosis present

## 2021-07-17 DIAGNOSIS — Z91018 Allergy to other foods: Secondary | ICD-10-CM

## 2021-07-17 DIAGNOSIS — I428 Other cardiomyopathies: Secondary | ICD-10-CM | POA: Diagnosis present

## 2021-07-17 DIAGNOSIS — I5020 Unspecified systolic (congestive) heart failure: Secondary | ICD-10-CM

## 2021-07-17 DIAGNOSIS — I472 Ventricular tachycardia, unspecified: Secondary | ICD-10-CM | POA: Diagnosis present

## 2021-07-17 DIAGNOSIS — E440 Moderate protein-calorie malnutrition: Secondary | ICD-10-CM | POA: Insufficient documentation

## 2021-07-17 DIAGNOSIS — E876 Hypokalemia: Secondary | ICD-10-CM | POA: Diagnosis present

## 2021-07-17 DIAGNOSIS — Z781 Physical restraint status: Secondary | ICD-10-CM

## 2021-07-17 DIAGNOSIS — R739 Hyperglycemia, unspecified: Secondary | ICD-10-CM | POA: Diagnosis not present

## 2021-07-17 DIAGNOSIS — I493 Ventricular premature depolarization: Secondary | ICD-10-CM | POA: Diagnosis present

## 2021-07-17 DIAGNOSIS — H548 Legal blindness, as defined in USA: Secondary | ICD-10-CM | POA: Diagnosis present

## 2021-07-17 DIAGNOSIS — D8685 Sarcoid myocarditis: Secondary | ICD-10-CM | POA: Diagnosis present

## 2021-07-17 DIAGNOSIS — M8580 Other specified disorders of bone density and structure, unspecified site: Secondary | ICD-10-CM | POA: Diagnosis present

## 2021-07-17 DIAGNOSIS — J9602 Acute respiratory failure with hypercapnia: Secondary | ICD-10-CM | POA: Diagnosis present

## 2021-07-17 DIAGNOSIS — R34 Anuria and oliguria: Secondary | ICD-10-CM | POA: Diagnosis not present

## 2021-07-17 DIAGNOSIS — I5023 Acute on chronic systolic (congestive) heart failure: Secondary | ICD-10-CM | POA: Diagnosis not present

## 2021-07-17 DIAGNOSIS — D869 Sarcoidosis, unspecified: Secondary | ICD-10-CM | POA: Diagnosis present

## 2021-07-17 DIAGNOSIS — Z79899 Other long term (current) drug therapy: Secondary | ICD-10-CM

## 2021-07-17 DIAGNOSIS — D649 Anemia, unspecified: Secondary | ICD-10-CM | POA: Diagnosis present

## 2021-07-17 DIAGNOSIS — H409 Unspecified glaucoma: Secondary | ICD-10-CM | POA: Diagnosis present

## 2021-07-17 DIAGNOSIS — R069 Unspecified abnormalities of breathing: Secondary | ICD-10-CM

## 2021-07-17 DIAGNOSIS — Z95810 Presence of automatic (implantable) cardiac defibrillator: Secondary | ICD-10-CM

## 2021-07-17 DIAGNOSIS — I4891 Unspecified atrial fibrillation: Secondary | ICD-10-CM | POA: Diagnosis present

## 2021-07-17 DIAGNOSIS — D860 Sarcoidosis of lung: Secondary | ICD-10-CM | POA: Diagnosis present

## 2021-07-17 LAB — I-STAT CHEM 8, ED
BUN: 3 mg/dL — ABNORMAL LOW (ref 8–23)
Calcium, Ion: 1.03 mmol/L — ABNORMAL LOW (ref 1.15–1.40)
Chloride: 102 mmol/L (ref 98–111)
Creatinine, Ser: 0.8 mg/dL (ref 0.44–1.00)
Glucose, Bld: 114 mg/dL — ABNORMAL HIGH (ref 70–99)
HCT: 29 % — ABNORMAL LOW (ref 36.0–46.0)
Hemoglobin: 9.9 g/dL — ABNORMAL LOW (ref 12.0–15.0)
Potassium: 2.7 mmol/L — CL (ref 3.5–5.1)
Sodium: 143 mmol/L (ref 135–145)
TCO2: 28 mmol/L (ref 22–32)

## 2021-07-17 LAB — CBC WITH DIFFERENTIAL/PLATELET
Abs Immature Granulocytes: 0.02 10*3/uL (ref 0.00–0.07)
Basophils Absolute: 0 10*3/uL (ref 0.0–0.1)
Basophils Relative: 1 %
Eosinophils Absolute: 0.1 10*3/uL (ref 0.0–0.5)
Eosinophils Relative: 2 %
HCT: 30.4 % — ABNORMAL LOW (ref 36.0–46.0)
Hemoglobin: 9.4 g/dL — ABNORMAL LOW (ref 12.0–15.0)
Immature Granulocytes: 1 %
Lymphocytes Relative: 15 %
Lymphs Abs: 0.6 10*3/uL — ABNORMAL LOW (ref 0.7–4.0)
MCH: 29.3 pg (ref 26.0–34.0)
MCHC: 30.9 g/dL (ref 30.0–36.0)
MCV: 94.7 fL (ref 80.0–100.0)
Monocytes Absolute: 0.3 10*3/uL (ref 0.1–1.0)
Monocytes Relative: 8 %
Neutro Abs: 3.2 10*3/uL (ref 1.7–7.7)
Neutrophils Relative %: 73 %
Platelets: 320 10*3/uL (ref 150–400)
RBC: 3.21 MIL/uL — ABNORMAL LOW (ref 3.87–5.11)
RDW: 15.1 % (ref 11.5–15.5)
WBC: 4.3 10*3/uL (ref 4.0–10.5)
nRBC: 0 % (ref 0.0–0.2)

## 2021-07-17 LAB — COMPREHENSIVE METABOLIC PANEL
ALT: 21 U/L (ref 0–44)
AST: 41 U/L (ref 15–41)
Albumin: 2.9 g/dL — ABNORMAL LOW (ref 3.5–5.0)
Alkaline Phosphatase: 123 U/L (ref 38–126)
Anion gap: 11 (ref 5–15)
BUN: <5 mg/dL — ABNORMAL LOW (ref 8–23)
CO2: 27 mmol/L (ref 22–32)
Calcium: 8.2 mg/dL — ABNORMAL LOW (ref 8.9–10.3)
Chloride: 103 mmol/L (ref 98–111)
Creatinine, Ser: 0.83 mg/dL (ref 0.44–1.00)
GFR, Estimated: >60 mL/min (ref >60)
Glucose, Bld: 113 mg/dL — ABNORMAL HIGH (ref 70–99)
Potassium: 2.8 mmol/L — ABNORMAL LOW (ref 3.5–5.1)
Sodium: 141 mmol/L (ref 135–145)
Total Bilirubin: 1.3 mg/dL — ABNORMAL HIGH (ref 0.3–1.2)
Total Protein: 6.3 g/dL — ABNORMAL LOW (ref 6.5–8.1)

## 2021-07-17 LAB — ABO/RH: ABO/RH(D): O POS

## 2021-07-17 LAB — TROPONIN I (HIGH SENSITIVITY): Troponin I (High Sensitivity): 16 ng/L (ref <18)

## 2021-07-17 LAB — TYPE AND SCREEN
ABO/RH(D): O POS
Antibody Screen: NEGATIVE

## 2021-07-17 LAB — CBG MONITORING, ED: Glucose-Capillary: 109 mg/dL — ABNORMAL HIGH (ref 70–99)

## 2021-07-17 NOTE — ED Triage Notes (Signed)
Patient arrived with EMS from home family reported syncopal episode this evening , CBG=] 130, received NS 125 ml by EMS prior to arrival , denies injury / respirations unlabored, alert and oriented at arrival .

## 2021-07-17 NOTE — ED Provider Notes (Signed)
Emergency Medicine Provider Triage Evaluation Note  Robin Estes , a 75 y.o. female  was evaluated in triage.  Pt complains of syncope. Pt was sitting with family pta when she was noted to have a syncopal episode. She did not fall. She felt lightheaded prior to this. Now she c/o feeling tired and weak.  Review of Systems  Positive: Weakness, fatigue, syncope Negative: Chest pain, sob, bloody stools  Physical Exam  There were no vitals taken for this visit. Gen:   Awake, no distress   Resp:  Normal effort  MSK:   Moves extremities without difficulty  Other:  Appears pale, heart with rrr, lungs clear, answering questions appropriately with clear speech  Medical Decision Making  Medically screening exam initiated at 9:56 PM.  Appropriate orders placed.  Robin Estes was informed that the remainder of the evaluation will be completed by another provider, this initial triage assessment does not replace that evaluation, and the importance of remaining in the ED until their evaluation is complete.     Rayne Du 07/17/21 2157    Margarita Grizzle, MD 07/20/21 [phone]

## 2021-07-17 NOTE — ED Notes (Addendum)
Gave critical result to MD Ray

## 2021-07-18 ENCOUNTER — Inpatient Hospital Stay (HOSPITAL_COMMUNITY): Payer: Medicare Other

## 2021-07-18 ENCOUNTER — Inpatient Hospital Stay: Payer: Self-pay

## 2021-07-18 ENCOUNTER — Emergency Department (HOSPITAL_COMMUNITY): Payer: Medicare Other

## 2021-07-18 DIAGNOSIS — T380X5A Adverse effect of glucocorticoids and synthetic analogues, initial encounter: Secondary | ICD-10-CM | POA: Diagnosis not present

## 2021-07-18 DIAGNOSIS — I4891 Unspecified atrial fibrillation: Secondary | ICD-10-CM | POA: Diagnosis present

## 2021-07-18 DIAGNOSIS — J9602 Acute respiratory failure with hypercapnia: Secondary | ICD-10-CM | POA: Diagnosis present

## 2021-07-18 DIAGNOSIS — M8580 Other specified disorders of bone density and structure, unspecified site: Secondary | ICD-10-CM | POA: Diagnosis present

## 2021-07-18 DIAGNOSIS — R34 Anuria and oliguria: Secondary | ICD-10-CM | POA: Diagnosis not present

## 2021-07-18 DIAGNOSIS — I469 Cardiac arrest, cause unspecified: Secondary | ICD-10-CM | POA: Diagnosis present

## 2021-07-18 DIAGNOSIS — H409 Unspecified glaucoma: Secondary | ICD-10-CM | POA: Diagnosis present

## 2021-07-18 DIAGNOSIS — D649 Anemia, unspecified: Secondary | ICD-10-CM | POA: Diagnosis present

## 2021-07-18 DIAGNOSIS — I4901 Ventricular fibrillation: Principal | ICD-10-CM

## 2021-07-18 DIAGNOSIS — I462 Cardiac arrest due to underlying cardiac condition: Secondary | ICD-10-CM | POA: Diagnosis present

## 2021-07-18 DIAGNOSIS — Z23 Encounter for immunization: Secondary | ICD-10-CM | POA: Diagnosis present

## 2021-07-18 DIAGNOSIS — E876 Hypokalemia: Secondary | ICD-10-CM | POA: Diagnosis present

## 2021-07-18 DIAGNOSIS — R55 Syncope and collapse: Secondary | ICD-10-CM | POA: Diagnosis present

## 2021-07-18 DIAGNOSIS — I5023 Acute on chronic systolic (congestive) heart failure: Secondary | ICD-10-CM | POA: Diagnosis not present

## 2021-07-18 DIAGNOSIS — I5020 Unspecified systolic (congestive) heart failure: Secondary | ICD-10-CM

## 2021-07-18 DIAGNOSIS — I472 Ventricular tachycardia, unspecified: Secondary | ICD-10-CM | POA: Diagnosis present

## 2021-07-18 DIAGNOSIS — J9601 Acute respiratory failure with hypoxia: Secondary | ICD-10-CM | POA: Diagnosis present

## 2021-07-18 DIAGNOSIS — Z978 Presence of other specified devices: Secondary | ICD-10-CM | POA: Diagnosis present

## 2021-07-18 DIAGNOSIS — D8685 Sarcoid myocarditis: Secondary | ICD-10-CM | POA: Diagnosis present

## 2021-07-18 DIAGNOSIS — N170 Acute kidney failure with tubular necrosis: Secondary | ICD-10-CM | POA: Diagnosis not present

## 2021-07-18 DIAGNOSIS — Z20822 Contact with and (suspected) exposure to covid-19: Secondary | ICD-10-CM | POA: Diagnosis present

## 2021-07-18 DIAGNOSIS — I340 Nonrheumatic mitral (valve) insufficiency: Secondary | ICD-10-CM | POA: Diagnosis present

## 2021-07-18 DIAGNOSIS — I493 Ventricular premature depolarization: Secondary | ICD-10-CM | POA: Diagnosis present

## 2021-07-18 DIAGNOSIS — D869 Sarcoidosis, unspecified: Secondary | ICD-10-CM

## 2021-07-18 DIAGNOSIS — I428 Other cardiomyopathies: Secondary | ICD-10-CM | POA: Diagnosis present

## 2021-07-18 DIAGNOSIS — H548 Legal blindness, as defined in USA: Secondary | ICD-10-CM | POA: Diagnosis present

## 2021-07-18 DIAGNOSIS — I110 Hypertensive heart disease with heart failure: Secondary | ICD-10-CM | POA: Diagnosis present

## 2021-07-18 LAB — URINALYSIS, ROUTINE W REFLEX MICROSCOPIC
Bilirubin Urine: NEGATIVE
Glucose, UA: 50 mg/dL — AB
Ketones, ur: NEGATIVE mg/dL
Leukocytes,Ua: NEGATIVE
Nitrite: NEGATIVE
Protein, ur: 100 mg/dL — AB
Specific Gravity, Urine: 1.011 (ref 1.005–1.030)
pH: 7 (ref 5.0–8.0)

## 2021-07-18 LAB — COMPREHENSIVE METABOLIC PANEL
ALT: 25 U/L (ref 0–44)
AST: 56 U/L — ABNORMAL HIGH (ref 15–41)
Albumin: 2.7 g/dL — ABNORMAL LOW (ref 3.5–5.0)
Alkaline Phosphatase: 127 U/L — ABNORMAL HIGH (ref 38–126)
Anion gap: 15 (ref 5–15)
BUN: 6 mg/dL — ABNORMAL LOW (ref 8–23)
CO2: 21 mmol/L — ABNORMAL LOW (ref 22–32)
Calcium: 7.9 mg/dL — ABNORMAL LOW (ref 8.9–10.3)
Chloride: 104 mmol/L (ref 98–111)
Creatinine, Ser: 0.93 mg/dL (ref 0.44–1.00)
GFR, Estimated: >60 mL/min (ref >60)
Glucose, Bld: 133 mg/dL — ABNORMAL HIGH (ref 70–99)
Potassium: 3.1 mmol/L — ABNORMAL LOW (ref 3.5–5.1)
Sodium: 140 mmol/L (ref 135–145)
Total Bilirubin: 1.4 mg/dL — ABNORMAL HIGH (ref 0.3–1.2)
Total Protein: 5.8 g/dL — ABNORMAL LOW (ref 6.5–8.1)

## 2021-07-18 LAB — RAPID URINE DRUG SCREEN, HOSP PERFORMED
Amphetamines: NOT DETECTED
Barbiturates: NOT DETECTED
Benzodiazepines: POSITIVE — AB
Cocaine: NOT DETECTED
Opiates: NOT DETECTED
Tetrahydrocannabinol: NOT DETECTED

## 2021-07-18 LAB — I-STAT ARTERIAL BLOOD GAS, ED
Acid-Base Excess: 6 mmol/L — ABNORMAL HIGH (ref 0.0–2.0)
Acid-Base Excess: 2 mmol/L (ref 0.0–2.0)
Bicarbonate: 27.5 mmol/L (ref 20.0–28.0)
Bicarbonate: 24.8 mmol/L (ref 20.0–28.0)
Calcium, Ion: 0.97 mmol/L — ABNORMAL LOW (ref 1.15–1.40)
Calcium, Ion: 1.1 mmol/L — ABNORMAL LOW (ref 1.15–1.40)
HCT: 25 % — ABNORMAL LOW (ref 36.0–46.0)
HCT: 26 % — ABNORMAL LOW (ref 36.0–46.0)
Hemoglobin: 8.5 g/dL — ABNORMAL LOW (ref 12.0–15.0)
Hemoglobin: 8.8 g/dL — ABNORMAL LOW (ref 12.0–15.0)
O2 Saturation: 100 %
O2 Saturation: 100 %
Patient temperature: 97.7
Patient temperature: 97.7
Potassium: 3.1 mmol/L — ABNORMAL LOW (ref 3.5–5.1)
Potassium: 3.3 mmol/L — ABNORMAL LOW (ref 3.5–5.1)
Sodium: 140 mmol/L (ref 135–145)
Sodium: 139 mmol/L (ref 135–145)
TCO2: 28 mmol/L (ref 22–32)
TCO2: 26 mmol/L (ref 22–32)
pCO2 arterial: 27.9 mmHg — ABNORMAL LOW (ref 32.0–48.0)
pCO2 arterial: 28.9 mmHg — ABNORMAL LOW (ref 32.0–48.0)
pH, Arterial: 7.599 — ABNORMAL HIGH (ref 7.350–7.450)
pH, Arterial: 7.539 — ABNORMAL HIGH (ref 7.350–7.450)
pO2, Arterial: 387 mmHg — ABNORMAL HIGH (ref 83.0–108.0)
pO2, Arterial: 182 mmHg — ABNORMAL HIGH (ref 83.0–108.0)

## 2021-07-18 LAB — GLUCOSE, CAPILLARY: Glucose-Capillary: 150 mg/dL — ABNORMAL HIGH (ref 70–99)

## 2021-07-18 LAB — BASIC METABOLIC PANEL
Anion gap: 16 — ABNORMAL HIGH (ref 5–15)
Anion gap: 7 (ref 5–15)
BUN: 6 mg/dL — ABNORMAL LOW (ref 8–23)
BUN: 7 mg/dL — ABNORMAL LOW (ref 8–23)
CO2: 21 mmol/L — ABNORMAL LOW (ref 22–32)
CO2: 25 mmol/L (ref 22–32)
Calcium: 8.4 mg/dL — ABNORMAL LOW (ref 8.9–10.3)
Calcium: 8 mg/dL — ABNORMAL LOW (ref 8.9–10.3)
Chloride: 104 mmol/L (ref 98–111)
Chloride: 102 mmol/L (ref 98–111)
Creatinine, Ser: 0.9 mg/dL (ref 0.44–1.00)
Creatinine, Ser: 0.9 mg/dL (ref 0.44–1.00)
GFR, Estimated: >60 mL/min (ref >60)
GFR, Estimated: >60 mL/min (ref >60)
Glucose, Bld: 172 mg/dL — ABNORMAL HIGH (ref 70–99)
Glucose, Bld: 288 mg/dL — ABNORMAL HIGH (ref 70–99)
Potassium: 3.3 mmol/L — ABNORMAL LOW (ref 3.5–5.1)
Potassium: 3.3 mmol/L — ABNORMAL LOW (ref 3.5–5.1)
Sodium: 141 mmol/L (ref 135–145)
Sodium: 134 mmol/L — ABNORMAL LOW (ref 135–145)

## 2021-07-18 LAB — POCT I-STAT 7, (LYTES, BLD GAS, ICA,H+H)
Acid-Base Excess: 1 mmol/L (ref 0.0–2.0)
Bicarbonate: 25.1 mmol/L (ref 20.0–28.0)
Calcium, Ion: 1.09 mmol/L — ABNORMAL LOW (ref 1.15–1.40)
HCT: 30 % — ABNORMAL LOW (ref 36.0–46.0)
Hemoglobin: 10.2 g/dL — ABNORMAL LOW (ref 12.0–15.0)
O2 Saturation: 93 %
Patient temperature: 97.9
Potassium: 3.4 mmol/L — ABNORMAL LOW (ref 3.5–5.1)
Sodium: 137 mmol/L (ref 135–145)
TCO2: 26 mmol/L (ref 22–32)
pCO2 arterial: 36.6 mmHg (ref 32.0–48.0)
pH, Arterial: 7.442 (ref 7.350–7.450)
pO2, Arterial: 62 mmHg — ABNORMAL LOW (ref 83.0–108.0)

## 2021-07-18 LAB — ECHOCARDIOGRAM COMPLETE
AR max vel: 1.51 cm2
AV Area VTI: 1.41 cm2
AV Area mean vel: 1.33 cm2
AV Mean grad: 3 mmHg
AV Peak grad: 4.6 mmHg
Ao pk vel: 1.07 m/s
Area-P 1/2: 4.6 cm2
Calc EF: 39.1 %
Height: 62 in
MV M vel: 4.75 m/s
MV Peak grad: 90.3 mmHg
Radius: 0.7 cm
Single Plane A2C EF: 39.8 %
Single Plane A4C EF: 38.2 %

## 2021-07-18 LAB — CBC WITH DIFFERENTIAL/PLATELET
Abs Immature Granulocytes: 0.15 10*3/uL — ABNORMAL HIGH (ref 0.00–0.07)
Basophils Absolute: 0 10*3/uL (ref 0.0–0.1)
Basophils Relative: 0 %
Eosinophils Absolute: 0 10*3/uL (ref 0.0–0.5)
Eosinophils Relative: 0 %
HCT: 31 % — ABNORMAL LOW (ref 36.0–46.0)
Hemoglobin: 9.7 g/dL — ABNORMAL LOW (ref 12.0–15.0)
Immature Granulocytes: 2 %
Lymphocytes Relative: 30 %
Lymphs Abs: 2.3 10*3/uL (ref 0.7–4.0)
MCH: 30 pg (ref 26.0–34.0)
MCHC: 31.3 g/dL (ref 30.0–36.0)
MCV: 96 fL (ref 80.0–100.0)
Monocytes Absolute: 0.3 10*3/uL (ref 0.1–1.0)
Monocytes Relative: 4 %
Neutro Abs: 4.9 10*3/uL (ref 1.7–7.7)
Neutrophils Relative %: 64 %
Platelets: 376 10*3/uL (ref 150–400)
RBC: 3.23 MIL/uL — ABNORMAL LOW (ref 3.87–5.11)
RDW: 15.4 % (ref 11.5–15.5)
WBC: 7.6 10*3/uL (ref 4.0–10.5)
nRBC: 0 % (ref 0.0–0.2)

## 2021-07-18 LAB — MAGNESIUM
Magnesium: 1.6 mg/dL — ABNORMAL LOW (ref 1.7–2.4)
Magnesium: 2.3 mg/dL (ref 1.7–2.4)
Magnesium: 2.6 mg/dL — ABNORMAL HIGH (ref 1.7–2.4)
Magnesium: 2.6 mg/dL — ABNORMAL HIGH (ref 1.7–2.4)

## 2021-07-18 LAB — LACTIC ACID, PLASMA
Lactic Acid, Venous: 5.4 mmol/L (ref 0.5–1.9)
Lactic Acid, Venous: 4.7 mmol/L (ref 0.5–1.9)

## 2021-07-18 LAB — CBG MONITORING, ED: Glucose-Capillary: 94 mg/dL (ref 70–99)

## 2021-07-18 LAB — PHOSPHORUS
Phosphorus: 3.2 mg/dL (ref 2.5–4.6)
Phosphorus: 3.5 mg/dL (ref 2.5–4.6)

## 2021-07-18 LAB — TROPONIN I (HIGH SENSITIVITY)
Troponin I (High Sensitivity): 18 ng/L — ABNORMAL HIGH (ref <18)
Troponin I (High Sensitivity): 18 ng/L — ABNORMAL HIGH (ref <18)
Troponin I (High Sensitivity): 21 ng/L — ABNORMAL HIGH (ref <18)

## 2021-07-18 LAB — RESP PANEL BY RT-PCR (FLU A&B, COVID) ARPGX2
Influenza A by PCR: NEGATIVE
Influenza B by PCR: NEGATIVE
SARS Coronavirus 2 by RT PCR: NEGATIVE

## 2021-07-18 LAB — I-STAT CHEM 8, ED
BUN: 5 mg/dL — ABNORMAL LOW (ref 8–23)
Calcium, Ion: 0.97 mmol/L — ABNORMAL LOW (ref 1.15–1.40)
Chloride: 103 mmol/L (ref 98–111)
Creatinine, Ser: 0.7 mg/dL (ref 0.44–1.00)
Glucose, Bld: 121 mg/dL — ABNORMAL HIGH (ref 70–99)
HCT: 34 % — ABNORMAL LOW (ref 36.0–46.0)
Hemoglobin: 11.6 g/dL — ABNORMAL LOW (ref 12.0–15.0)
Potassium: 3.2 mmol/L — ABNORMAL LOW (ref 3.5–5.1)
Sodium: 143 mmol/L (ref 135–145)
TCO2: 26 mmol/L (ref 22–32)

## 2021-07-18 LAB — CREATININE, SERUM
Creatinine, Ser: 0.92 mg/dL (ref 0.44–1.00)
GFR, Estimated: >60 mL/min (ref >60)

## 2021-07-18 LAB — BRAIN NATRIURETIC PEPTIDE: B Natriuretic Peptide: 893.2 pg/mL — ABNORMAL HIGH (ref 0.0–100.0)

## 2021-07-18 LAB — D-DIMER, QUANTITATIVE: D-Dimer, Quant: 5.27 ug/mL-FEU — ABNORMAL HIGH (ref 0.00–0.50)

## 2021-07-18 IMAGING — CT CT HEAD W/O CM
3 series · 15 of 47 positions shown, 18 images · non-contrast
Comparison: None.

CLINICAL DATA: Altered mental status.

EXAM:
CT HEAD WITHOUT CONTRAST
TECHNIQUE: Contiguous axial images were obtained from the base of the skull
through the vertex without intravenous contrast.

[Series 3: head 5.0 h30s · axial · 0.46mm/px · z∈[+996,+1126]mm · 9 of 32 slices shown, 12 images]
[im 3/32  brain]
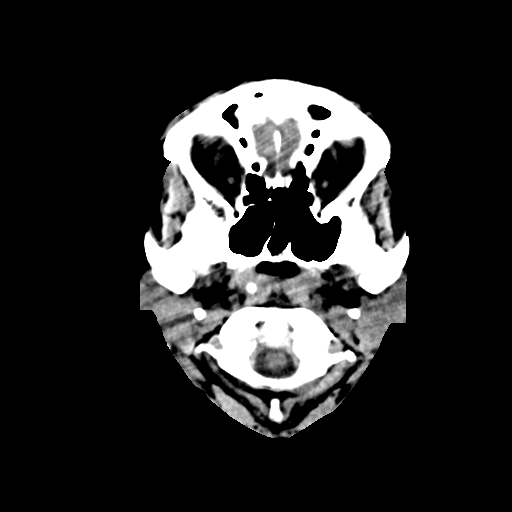
[im 3/32  bone]
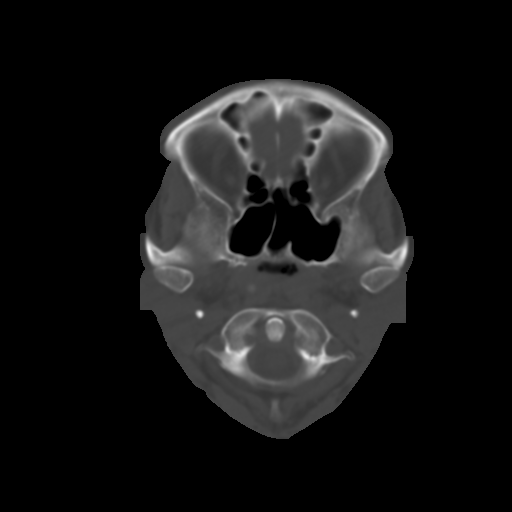
[im 6/32  brain]
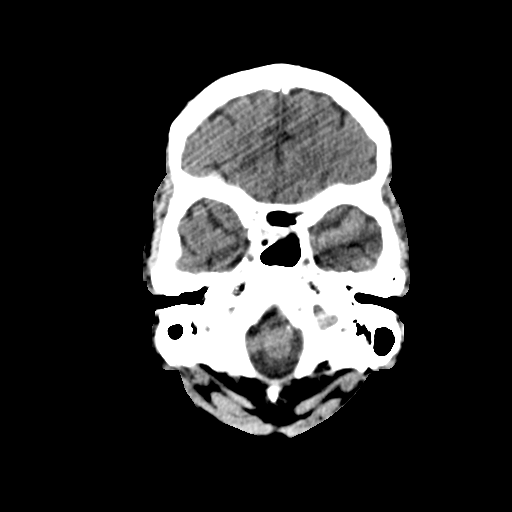
[im 9/32  brain]
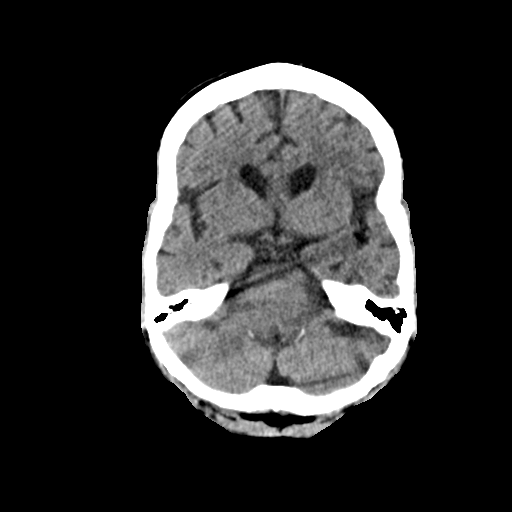
[im 12/32  brain]
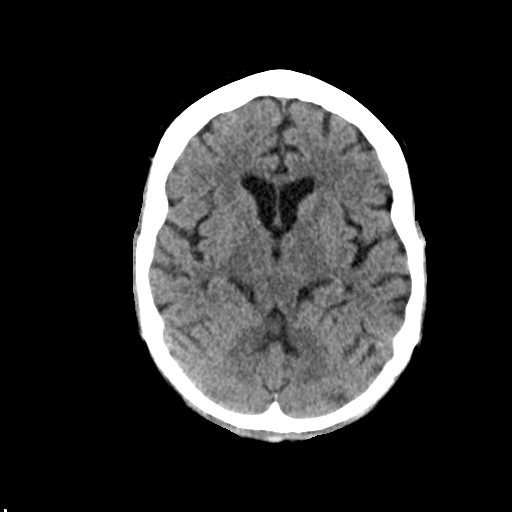
[im 17/32  brain]
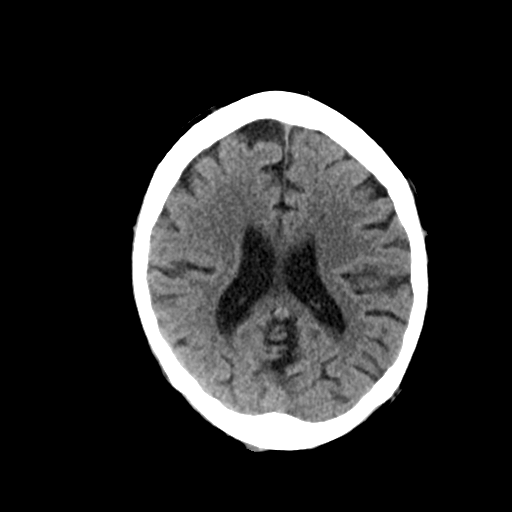
[im 17/32  bone]
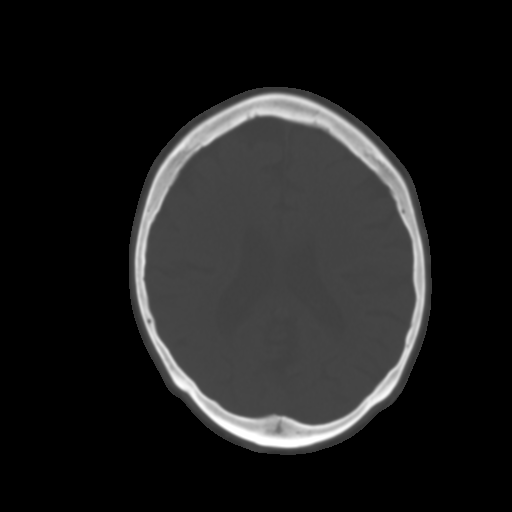
[im 20/32  brain]
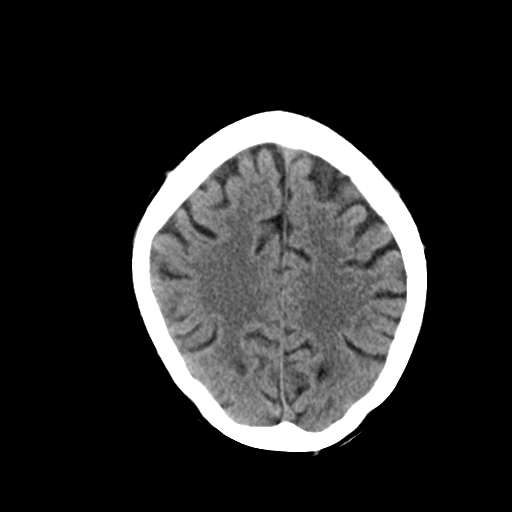
[im 23/32  brain]
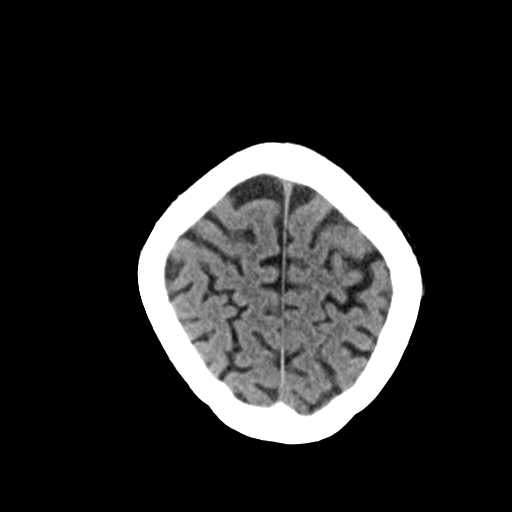
[im 26/32  brain]
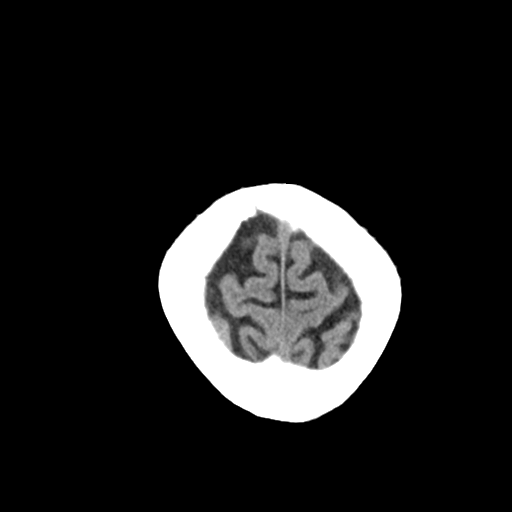
[im 29/32  brain]
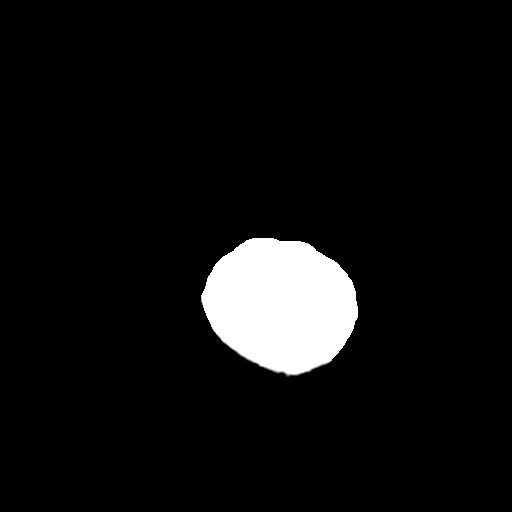
[im 29/32  bone]
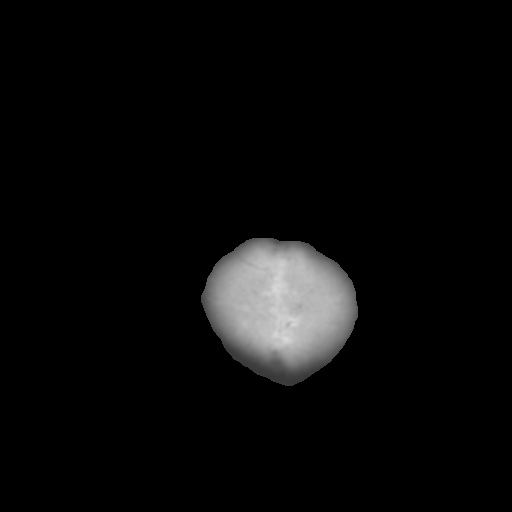

[Series 5: head 3.0 mpr cor · coronal · 0.30mm/px · 3 of 63 slices shown]
[im 21/63  brain]
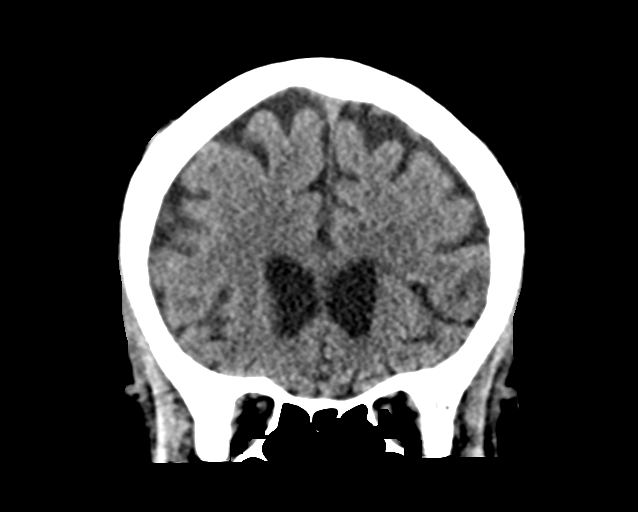
[im 28/63  brain]
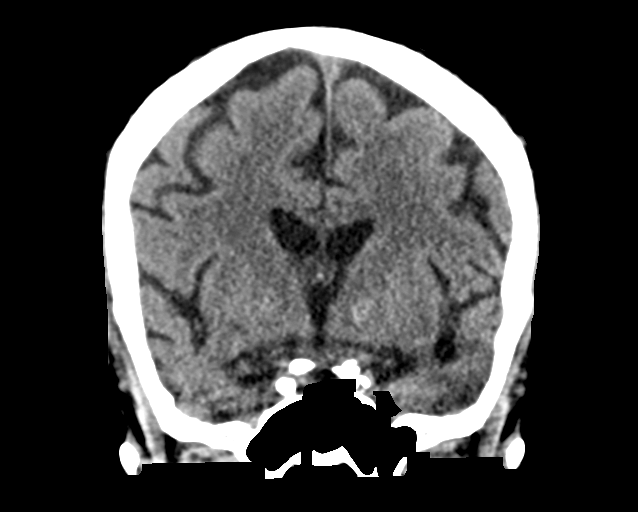
[im 35/63  brain]
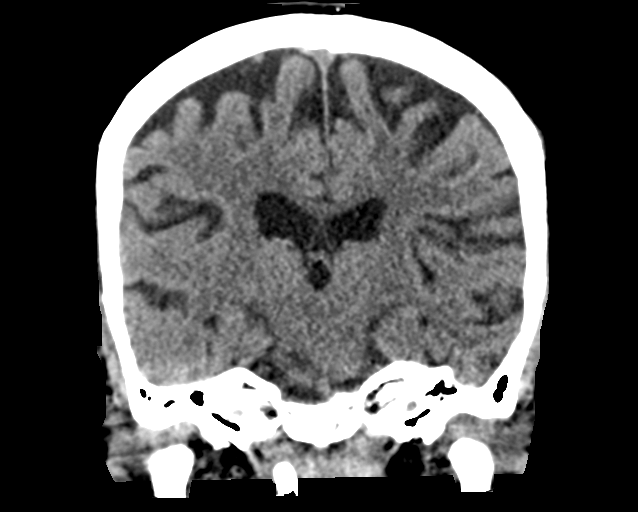

[Series 6: head 3.0 mpr sag · sagittal · 0.30mm/px · 3 of 51 slices shown]
[im 17/51  brain]
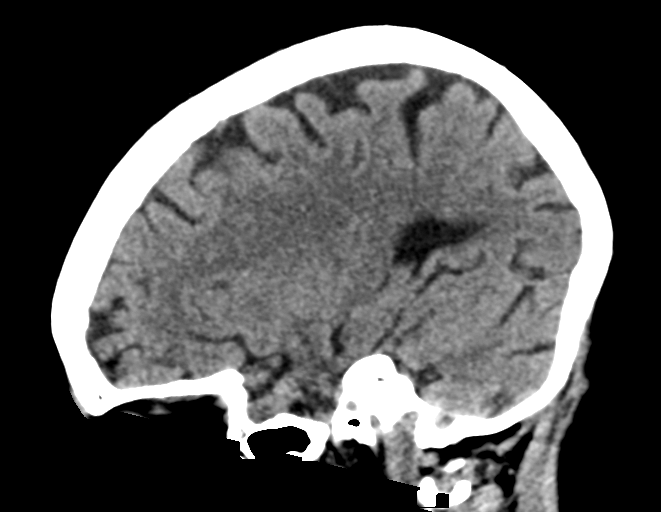
[im 26/51  brain]
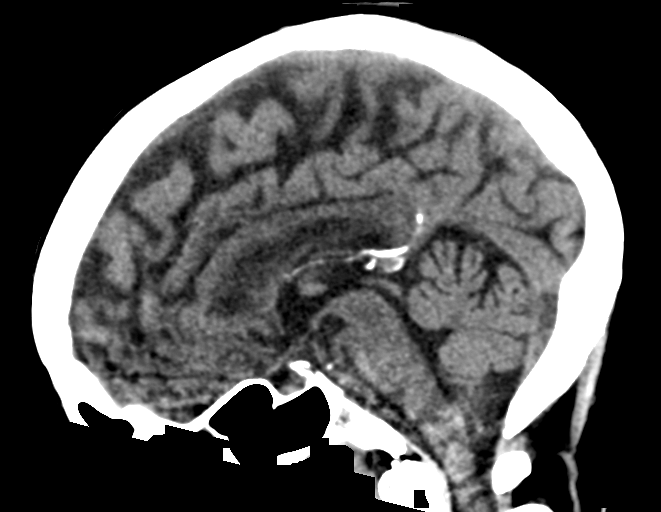
[im 34/51  brain]
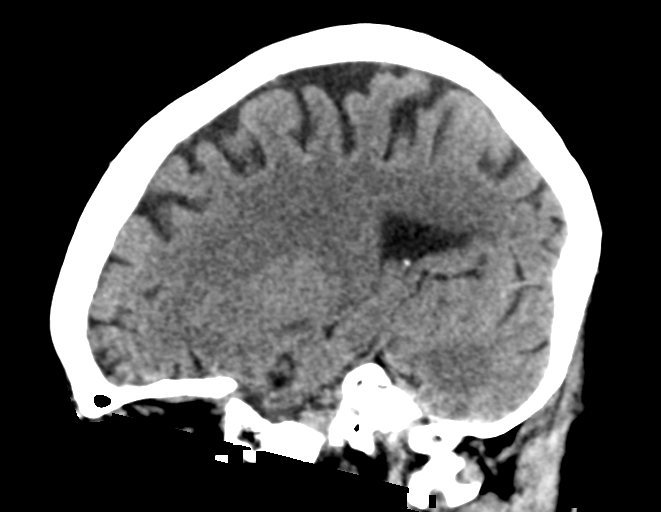

[15 of 47 positions shown; findings below may reference images not displayed]

FINDINGS: Brain: There is mild atrophy, small vessel disease and atrophic
ventriculomegaly. There is no midline shift. No asymmetry is seen
concerning for an acute infarct, hemorrhage or mass. There is mild
streak artifact on some of the lower slices due to overlying
material. Basal cisterns are clear.

Vascular: There are patchy calcifications in the carotid siphons,
distal vertebral arteries. There are no hyperdense central vessels.

Skull: Normal. Negative for fracture or focal lesion.

Sinuses/Orbits: Visualized orbital contents are unremarkable. There
is patchy membrane thickening in the ethmoid air cells. Other
visualized sinuses and bilateral mastoid air cells are clear with
maxillary sinuses not included.

Other: The patient is intubated.
IMPRESSION: No acute intracranial CT findings. Chronic change. Vascular
calcifications.

## 2021-07-18 IMAGING — DX DG CHEST 1V PORT
1 series · 1 of 1 positions shown · non-contrast
Comparison: Earlier same day

CLINICAL DATA: ETT adjustment

EXAM:
PORTABLE CHEST 1 VIEW

[chest ap]
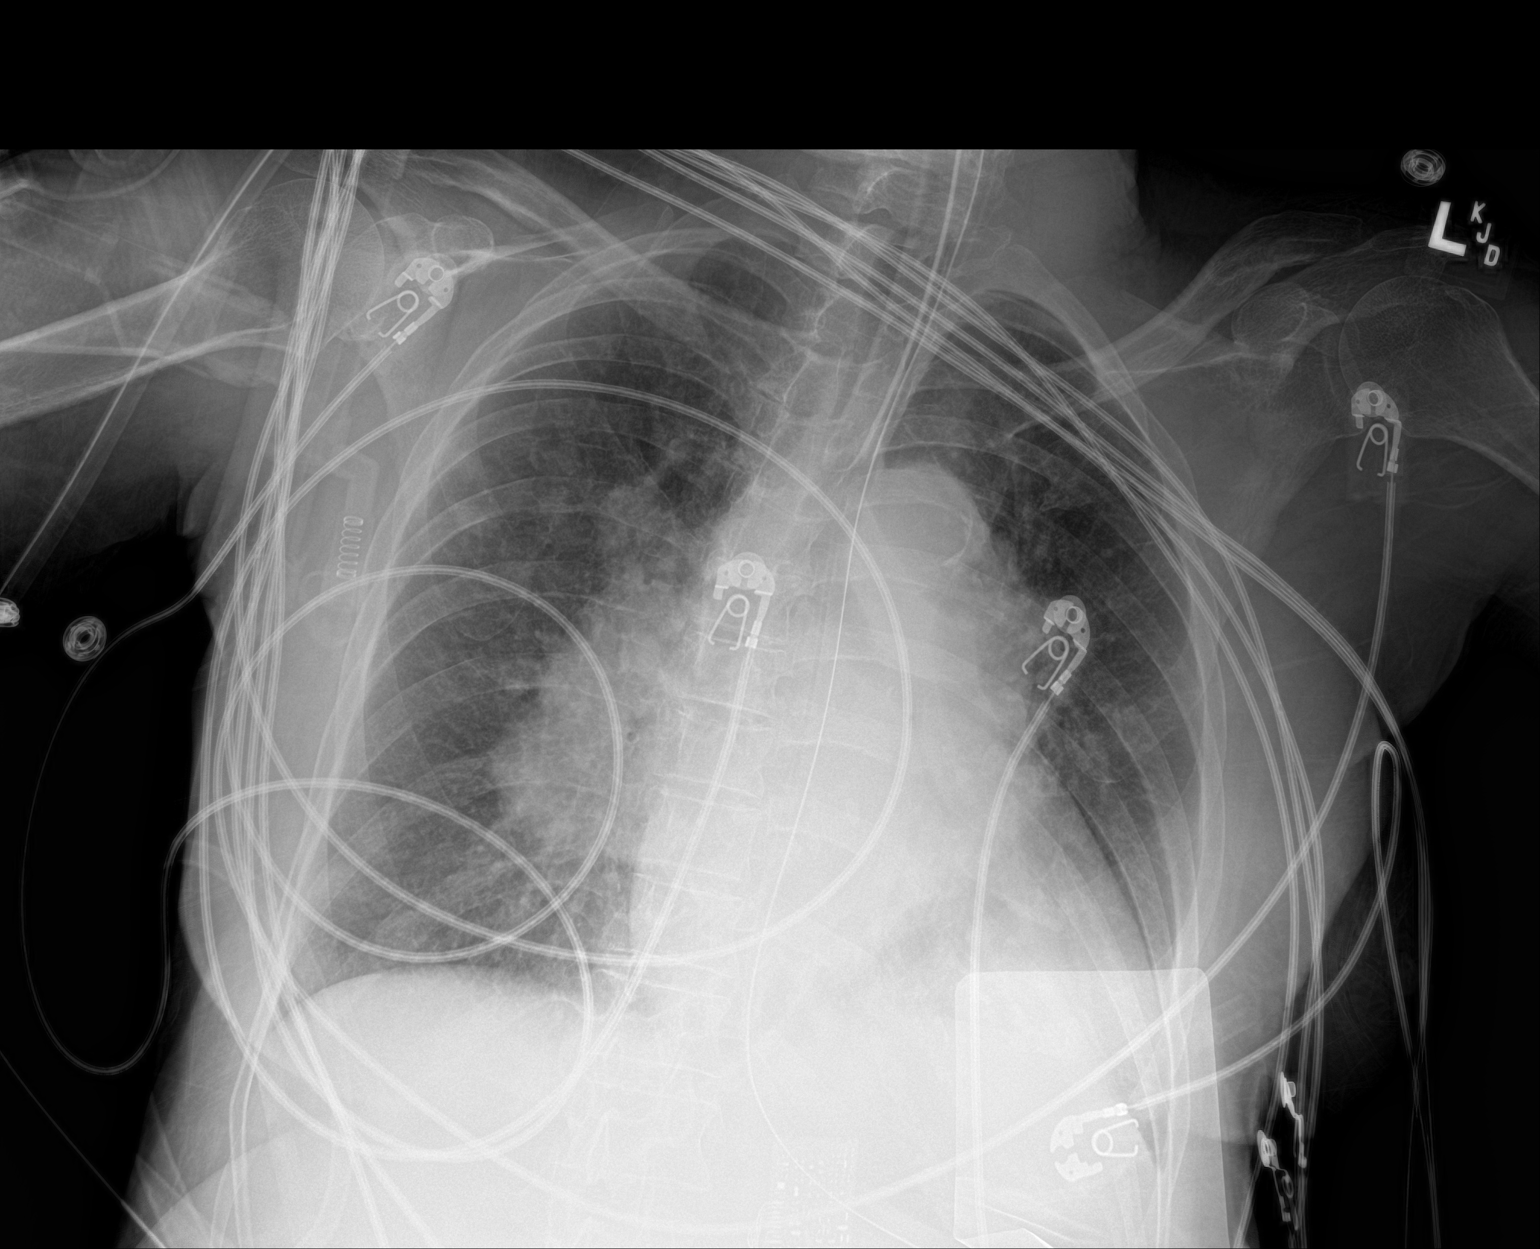

[1 of 1 positions shown; findings below may reference images not displayed]

FINDINGS: [TX] hours. Endotracheal tube tip has been repositioned 3.5 cm above
the base of the carina. The NG tube passes into the stomach although
the distal tip position is not included on the film. The cardio
pericardial silhouette is enlarged. Substantial fullness in the
right hilar region compatible with mediastinal and hilar
lymphadenopathy seen on recent chest CT. The visualized bony
structures of the thorax show no acute abnormality. Telemetry leads
overlie the chest.
IMPRESSION: 1. Endotracheal tube tip has been repositioned, now 3.5 cm above the
base of the carina.

## 2021-07-18 IMAGING — CT CT ANGIO CHEST
2 of 6 series · 17 of 36 positions shown · IV contrast (omnipaque)
Comparison: Portable chest from earlier today and CT chest with
contrast [DATE]

CLINICAL DATA: Cardiac arrest, VFib, high probability of pulmonary
embolism. History of sarcoidosis according to the history from the
prior chest CT with contrast.

EXAM:
CT ANGIOGRAPHY CHEST WITH CONTRAST
TECHNIQUE: Multidetector CT imaging of the chest was performed using the
standard protocol during bolus administration of intravenous
contrast. Multiplanar CT image reconstructions and MIPs were
obtained to evaluate the vascular anatomy.
CONTRAST:  65mL OMNIPAQUE IOHEXOL 350 MG/ML SOLN

[Series 6: thins · axial · 0.64mm/px · z∈[+657,+875]mm · 16 of 244 slices shown]
[im 13/244  lung]
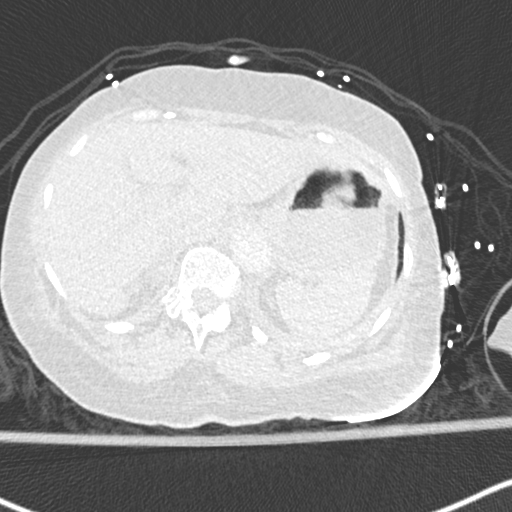
[im 25/244  mediastinal]
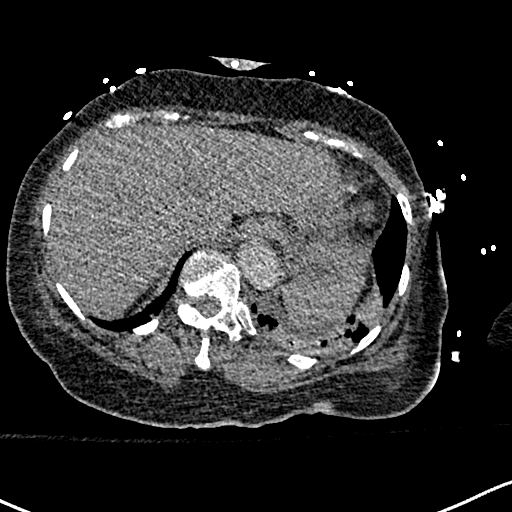
[im 37/244  lung]
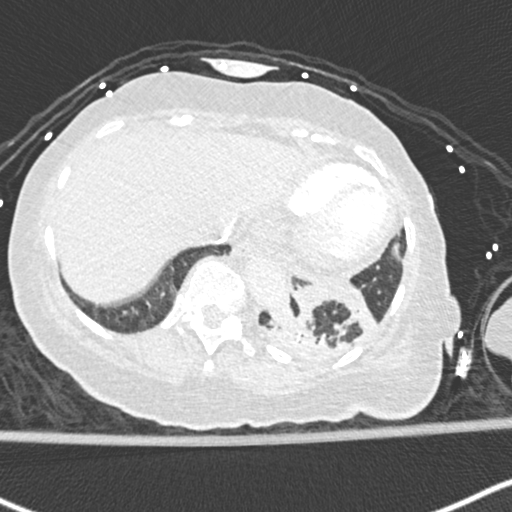
[im 61/244  mediastinal]
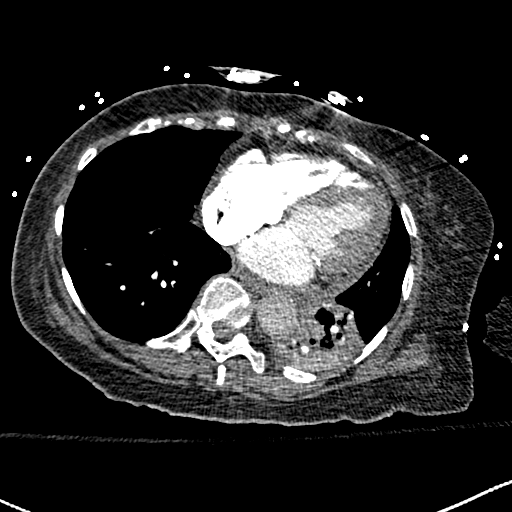
[im 73/244  lung]
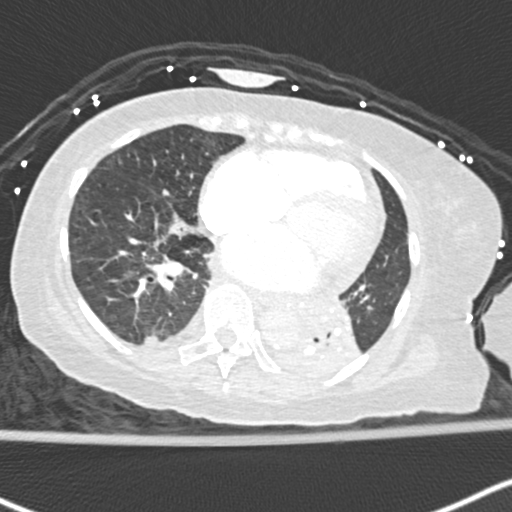
[im 86/244  mediastinal]
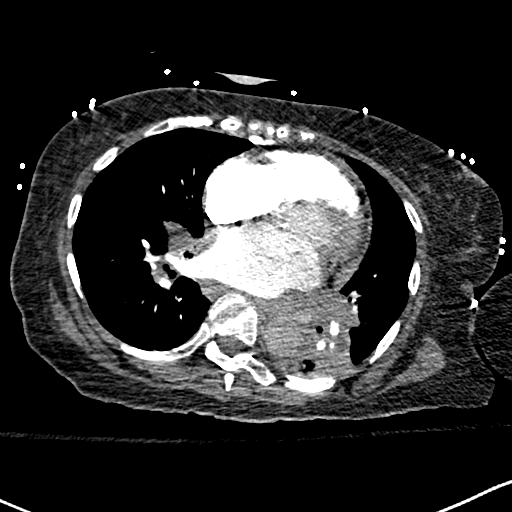
[im 98/244  lung]
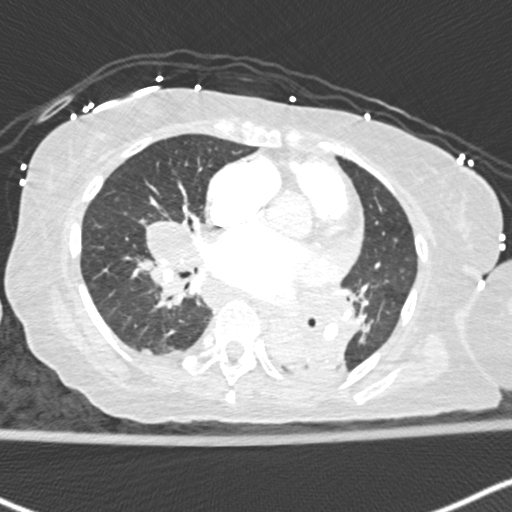
[im 110/244  mediastinal]
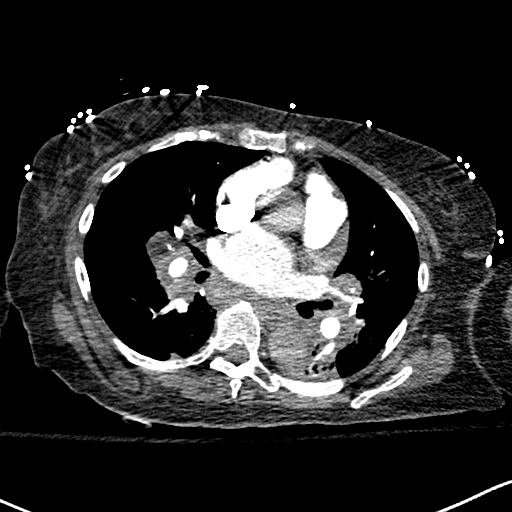
[im 134/244  lung]
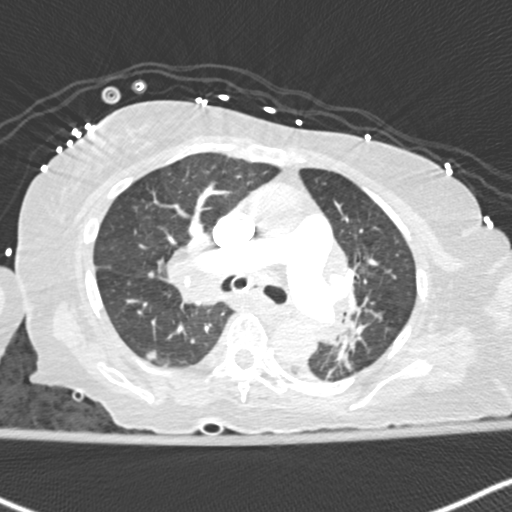
[im 146/244  mediastinal]
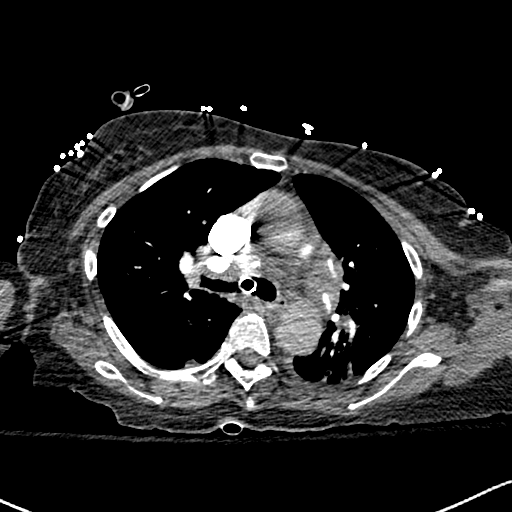
[im 158/244  lung]
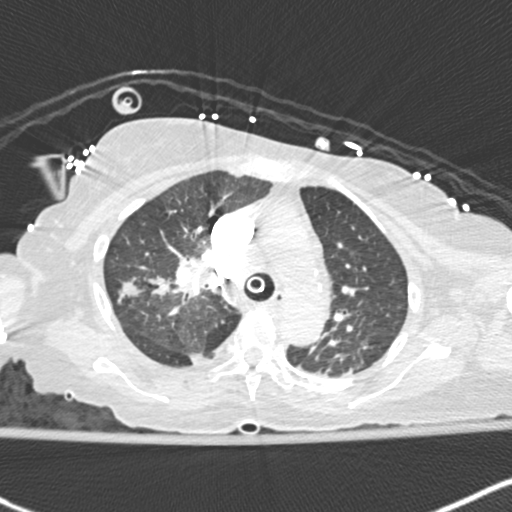
[im 171/244  mediastinal]
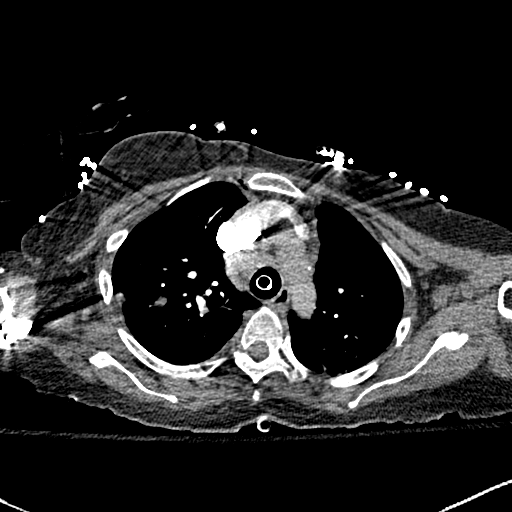
[im 183/244  lung]
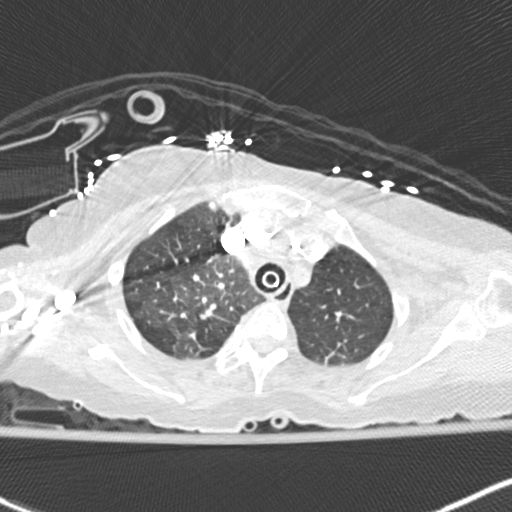
[im 207/244  mediastinal]
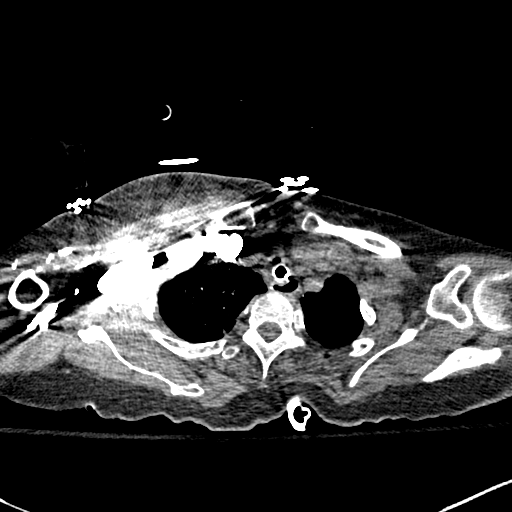
[im 219/244  lung]
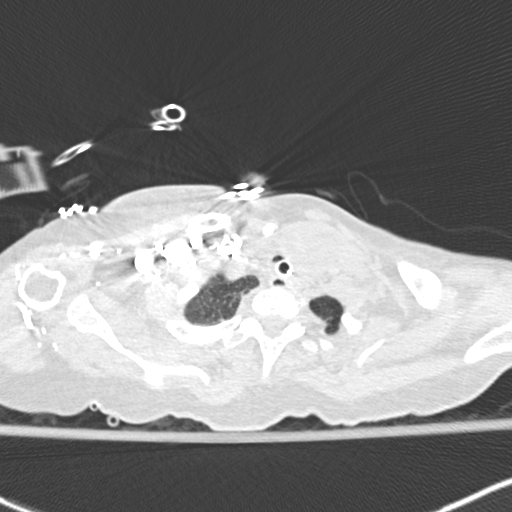
[im 231/244  mediastinal]
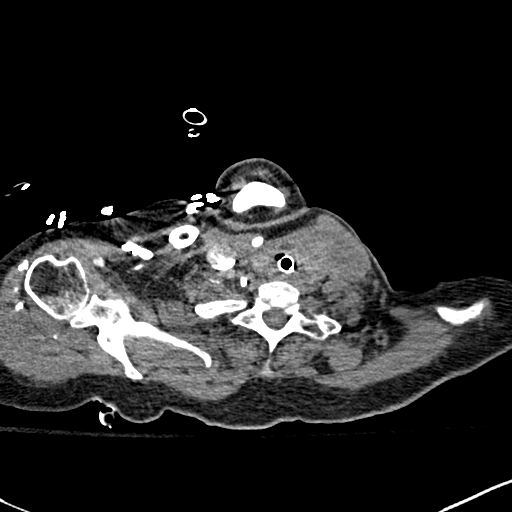

[Series 8: coronal mpr · coronal · 0.54mm/px · 1 of 116 slices shown]
[im 58/116  mediastinal]
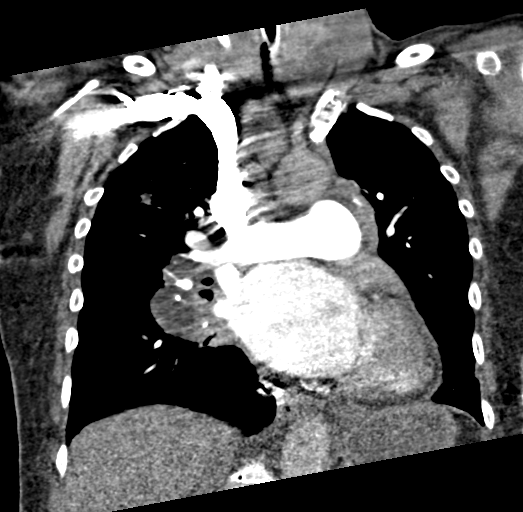

[17 of 36 positions shown; findings below may reference images not displayed]

FINDINGS: Cardiovascular: The pulmonary arteries are normal in caliber and
well opacified through the segmental divisions. No embolus is seen.
Subsegmental arteries are not well opacified. The thoracic aorta is
normal in caliber with mild patchy calcific plaques in the arch and
descending portions common descending segment tortuosity.

Aortic enhancement is insufficient to assess its lumen. Central
pulmonary veins are mildly distended but no more than previously.
There is mild cardiomegaly with slight reflux into the IVC and
panchamber involvement, small increased pericardial effusion
compared to the prior study.

Mediastinum/Nodes: Right mainstem bronchus intubation. This was seen
on today's chest x-ray. The tube should be withdrawn 5 cm for
optimal placement.

Extensive mediastinal and hilar adenopathy is similar to the prior
study and some lymph nodes as before containing calcifications
consistent with granulomatous disease process. This was seen
previously and there has been no worsening in the adenopathy but no
improvement either.

A large mixed attenuation mass of the left lobe of the thyroid is
unchanged in size at 4.0 x 3.9 cm. No supraclavicular or axillary
adenopathy is seen.

Lungs/Pleura: There are multiple bilateral irregular pulmonary
nodules, most numerous in the right upper lobe and most of them are
in a peribronchovascular distribution which is commonly seen with
sarcoid disease. No new or enlarging nodules are observed.
Individual right upper lobe nodules measure up to 1.1 cm in size.

Other scattered nodules in the remaining lungs are smaller. There
are increased posterior pleural-parenchymal opacities in the right
lower lobe which are probably due to atelectasis, and increased
dense consolidation in the left lower lobe, potentially related to
the right main bronchus intubation or potentially due to pneumonia.

There is increased subpleural septal thickening in the apices and
bases which could be progression of interstitial lung disease or
interstitial edema. Similarly there is increased perihilar
ground-glass haziness in the upper lobes which could be due to
interstitial disease or edema.

Upper Abdomen: No acute abnormality.

Musculoskeletal: No chest wall abnormality. No acute or significant
osseous findings.

Review of the MIP images confirms the above findings.
IMPRESSION: 1. No arterial embolism is seen through the segmental arteries. The
subsegmental arteries are not well seen.
2. Right main bronchus intubation. Withdraw tube 5 cm for optimal
position.
3. Dense consolidation with air bronchograms in the left lower lobe
which could be due to the right main bronchus intubation or
consolidation of other etiology.
4. No change in extensive mediastinal and hilar adenopathy and lung
nodules presumably sarcoid related.
5. Interstitial changes in the lung apices and bases which could be
due to edema or progression of interstitial lung disease related to
sarcoid. No pleural effusion is seen.
6. Cardiomegaly with mildly distended pulmonary veins.
7. Stable size of heterogeneous left thyroid mass. Follow-up as
indicated.

## 2021-07-18 IMAGING — DX DG CHEST 1V PORT
1 series · 1 of 1 positions shown · non-contrast
Comparison: [DATE]

CLINICAL DATA: Intubation

EXAM:
PORTABLE CHEST 1 VIEW

[chest ap]
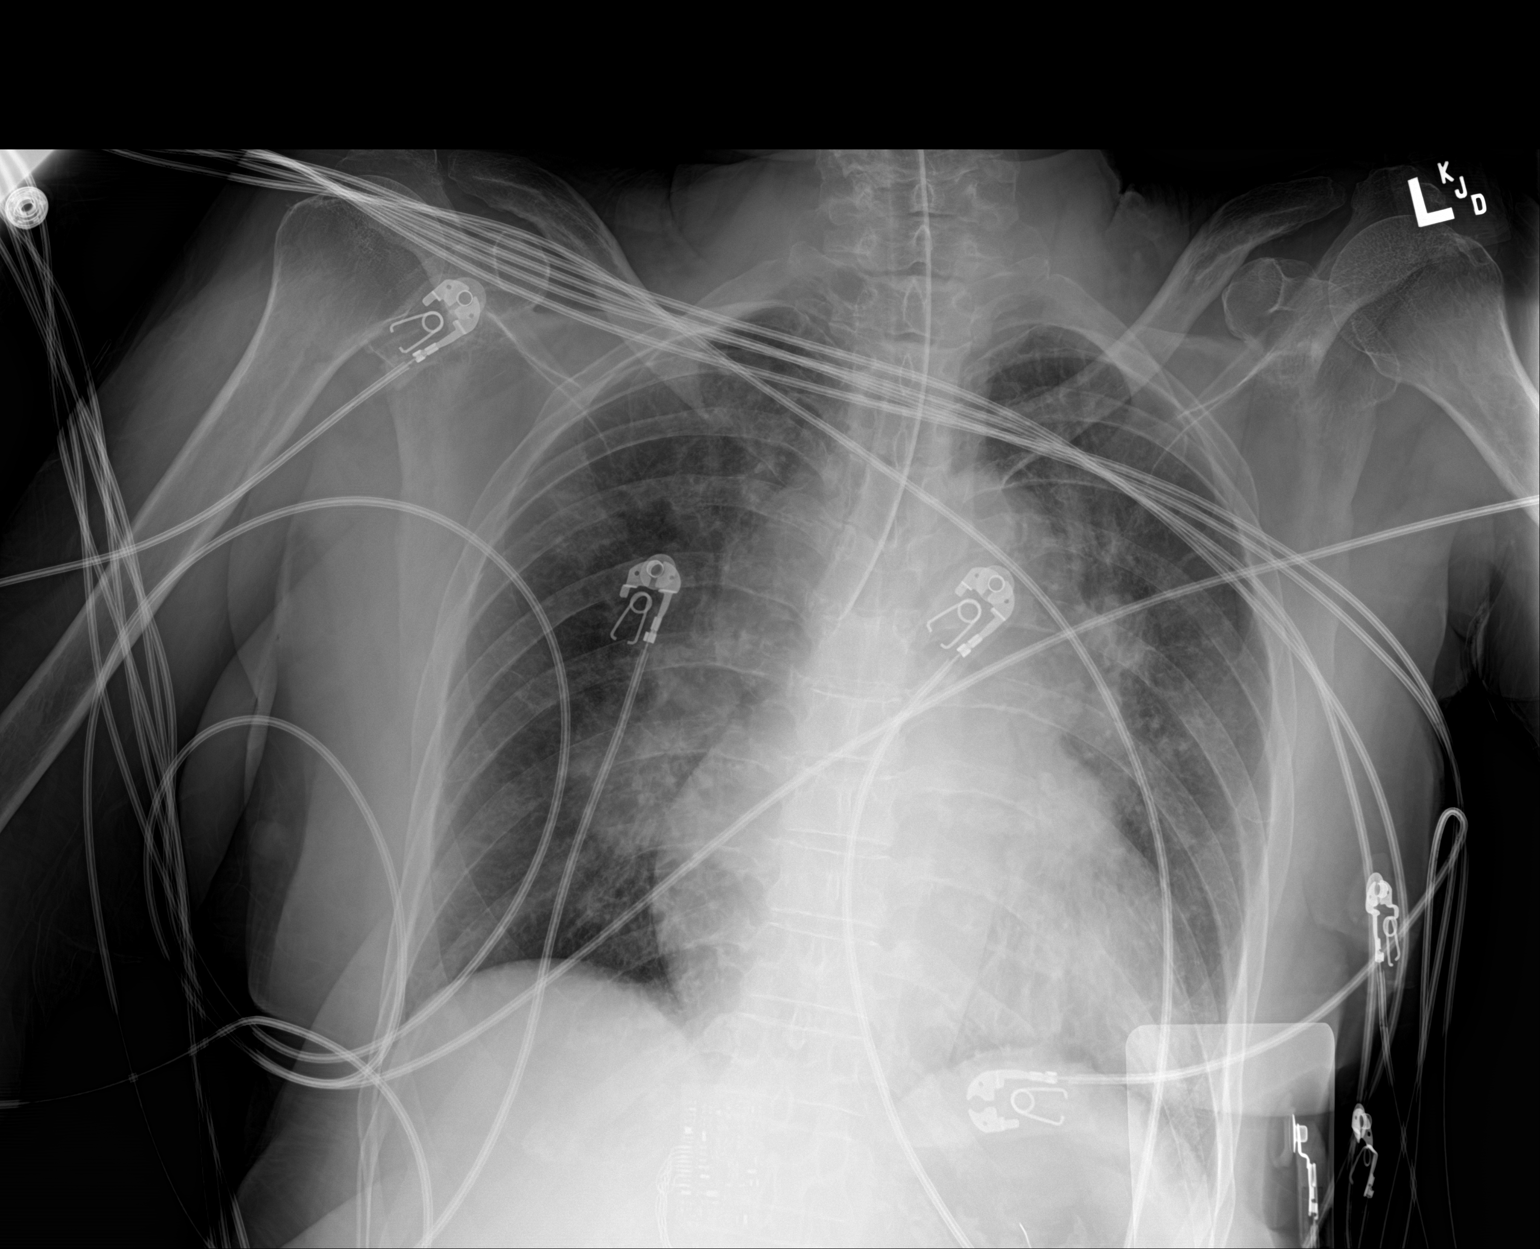

[1 of 1 positions shown; findings below may reference images not displayed]

FINDINGS: The tip of the endotracheal tube is just within the right mainstem
bronchus. Mild bilateral parahilar opacities. Normal
cardiomediastinal contours.
IMPRESSION: Endotracheal tube tip just within the right mainstem bronchus.
Retraction by approximately [address] the tip at the level
of the clavicular heads.

These results will be called to the ordering clinician or
representative by the Radiologist Assistant, and communication
documented in the PACS or [REDACTED].

## 2021-07-18 MED ORDER — MAGNESIUM SULFATE 2 GM/50ML IV SOLN
2.0000 g | Freq: Once | INTRAVENOUS | Status: AC
  Administered 2021-07-18: 2 g via INTRAVENOUS

## 2021-07-18 MED ORDER — SODIUM CHLORIDE 0.9% FLUSH
10.0000 mL | Freq: Two times a day (BID) | INTRAVENOUS | Status: DC
  Administered 2021-07-18: 10 mL
  Administered 2021-07-18: 14:00:00 20 mL
  Administered 2021-07-19 – 2021-07-26 (×14): 10 mL
  Administered 2021-07-27: 21:00:00 20 mL
  Administered 2021-07-28: 09:00:00 10 mL

## 2021-07-18 MED ORDER — FENTANYL CITRATE PF 50 MCG/ML IJ SOSY
50.0000 ug | PREFILLED_SYRINGE | Freq: Once | INTRAMUSCULAR | Status: AC
  Administered 2021-07-18: 50 ug via INTRAVENOUS

## 2021-07-18 MED ORDER — POTASSIUM CHLORIDE 20 MEQ PO PACK
40.0000 meq | PACK | ORAL | Status: AC
  Administered 2021-07-18 (×2): 40 meq
  Filled 2021-07-18 (×2): qty 2

## 2021-07-18 MED ORDER — METHYLPREDNISOLONE SODIUM SUCC 125 MG IJ SOLR
125.0000 mg | Freq: Once | INTRAMUSCULAR | Status: AC
  Administered 2021-07-18: 125 mg via INTRAVENOUS
  Filled 2021-07-18: qty 2

## 2021-07-18 MED ORDER — CHLORHEXIDINE GLUCONATE 0.12% ORAL RINSE (MEDLINE KIT)
15.0000 mL | Freq: Two times a day (BID) | OROMUCOSAL | Status: DC
  Administered 2021-07-18 – 2021-07-20 (×5): 15 mL via OROMUCOSAL

## 2021-07-18 MED ORDER — IOHEXOL 350 MG/ML SOLN
65.0000 mL | Freq: Once | INTRAVENOUS | Status: AC | PRN
  Administered 2021-07-18: 65 mL via INTRAVENOUS

## 2021-07-18 MED ORDER — SODIUM CHLORIDE 0.9% FLUSH
10.0000 mL | INTRAVENOUS | Status: DC | PRN
  Administered 2021-07-26: 10 mL

## 2021-07-18 MED ORDER — MIDAZOLAM HCL 2 MG/2ML IJ SOLN
2.0000 mg | INTRAMUSCULAR | Status: AC | PRN
  Administered 2021-07-18 (×2): 2 mg via INTRAVENOUS
  Filled 2021-07-18 (×2): qty 2

## 2021-07-18 MED ORDER — AMIODARONE HCL IN DEXTROSE 360-4.14 MG/200ML-% IV SOLN
INTRAVENOUS | Status: AC
  Filled 2021-07-18: qty 200

## 2021-07-18 MED ORDER — MIDAZOLAM HCL 2 MG/2ML IJ SOLN
2.0000 mg | INTRAMUSCULAR | Status: DC | PRN
  Filled 2021-07-18: qty 2

## 2021-07-18 MED ORDER — MIDAZOLAM HCL 2 MG/2ML IJ SOLN
INTRAMUSCULAR | Status: AC
  Administered 2021-07-18: 2 mg via INTRAVENOUS
  Filled 2021-07-18: qty 2

## 2021-07-18 MED ORDER — DOCUSATE SODIUM 50 MG/5ML PO LIQD: 100.0000 mg | Freq: Two times a day (BID) | ORAL | Status: DC | PRN

## 2021-07-18 MED ORDER — LIDOCAINE IN D5W 4-5 MG/ML-% IV SOLN
1.0000 mg/min | INTRAVENOUS | Status: DC
  Administered 2021-07-18 (×2): 2 mg/min via INTRAVENOUS
  Filled 2021-07-18 (×2): qty 500

## 2021-07-18 MED ORDER — FENTANYL 2500MCG IN NS 250ML (10MCG/ML) PREMIX INFUSION
0.0000 ug/h | INTRAVENOUS | Status: DC
  Administered 2021-07-18: 200 ug/h via INTRAVENOUS
  Administered 2021-07-18: 25 ug/h via INTRAVENOUS
  Administered 2021-07-18: 200 ug/h via INTRAVENOUS
  Administered 2021-07-18: 175 ug/h via INTRAVENOUS
  Filled 2021-07-18 (×2): qty 250

## 2021-07-18 MED ORDER — HEPARIN SODIUM (PORCINE) 5000 UNIT/ML IJ SOLN
5000.0000 [IU] | Freq: Three times a day (TID) | INTRAMUSCULAR | Status: DC
  Administered 2021-07-18 – 2021-07-25 (×19): 5000 [IU] via SUBCUTANEOUS
  Filled 2021-07-18 (×19): qty 1

## 2021-07-18 MED ORDER — NOREPINEPHRINE 4 MG/250ML-% IV SOLN
INTRAVENOUS | Status: AC
  Administered 2021-07-18: 5 ug/min
  Filled 2021-07-18: qty 250

## 2021-07-18 MED ORDER — MAGNESIUM SULFATE 2 GM/50ML IV SOLN
2.0000 g | Freq: Once | INTRAVENOUS | Status: AC
  Administered 2021-07-18: 2 g via INTRAVENOUS
  Filled 2021-07-18: qty 50

## 2021-07-18 MED ORDER — VITAL AF 1.2 CAL PO LIQD
1000.0000 mL | ORAL | Status: DC
  Administered 2021-07-18 – 2021-07-19 (×2): 1000 mL

## 2021-07-18 MED ORDER — POTASSIUM CHLORIDE 20 MEQ PO PACK
40.0000 meq | PACK | Freq: Once | ORAL | Status: AC
  Administered 2021-07-18: 40 meq
  Filled 2021-07-18: qty 2

## 2021-07-18 MED ORDER — NOREPINEPHRINE 4 MG/250ML-% IV SOLN
0.0000 ug/min | INTRAVENOUS | Status: DC
  Administered 2021-07-18: 2 ug/min via INTRAVENOUS
  Administered 2021-07-18: 3 ug/min via INTRAVENOUS
  Administered 2021-07-19: 11 ug/min via INTRAVENOUS
  Administered 2021-07-19: 8 ug/min via INTRAVENOUS
  Administered 2021-07-19 (×2): 16 ug/min via INTRAVENOUS
  Administered 2021-07-19: 14 ug/min via INTRAVENOUS
  Administered 2021-07-20: 6 ug/min via INTRAVENOUS
  Administered 2021-07-20: 8 ug/min via INTRAVENOUS
  Administered 2021-07-21: 18 ug/min via INTRAVENOUS
  Administered 2021-07-21: 15 ug/min via INTRAVENOUS
  Administered 2021-07-21: 16 ug/min via INTRAVENOUS
  Filled 2021-07-18 (×9): qty 250

## 2021-07-18 MED ORDER — ETOMIDATE 2 MG/ML IV SOLN
INTRAVENOUS | Status: AC | PRN
  Administered 2021-07-18: 20 mg via INTRAVENOUS

## 2021-07-18 MED ORDER — SODIUM CHLORIDE 0.9 % IV BOLUS: 1000.0000 mL | Freq: Once | INTRAVENOUS | Status: DC

## 2021-07-18 MED ORDER — POLYETHYLENE GLYCOL 3350 17 G PO PACK: 17.0000 g | PACK | Freq: Every day | ORAL | Status: DC | PRN

## 2021-07-18 MED ORDER — POTASSIUM CHLORIDE 10 MEQ/100ML IV SOLN
10.0000 meq | INTRAVENOUS | Status: DC
  Administered 2021-07-18 (×2): 10 meq via INTRAVENOUS
  Filled 2021-07-18 (×3): qty 100

## 2021-07-18 MED ORDER — FAMOTIDINE IN NACL 20-0.9 MG/50ML-% IV SOLN
20.0000 mg | Freq: Two times a day (BID) | INTRAVENOUS | Status: DC
  Administered 2021-07-18 – 2021-07-19 (×3): 20 mg via INTRAVENOUS
  Filled 2021-07-18 (×3): qty 50

## 2021-07-18 MED ORDER — LIDOCAINE HCL (CARDIAC) PF 100 MG/5ML IV SOSY
100.0000 mg | PREFILLED_SYRINGE | Freq: Once | INTRAVENOUS | Status: AC
Start: 2021-07-18 — End: 2021-07-18
  Administered 2021-07-18: 100 mg via INTRAVENOUS

## 2021-07-18 MED ORDER — SUCCINYLCHOLINE CHLORIDE 20 MG/ML IJ SOLN
INTRAMUSCULAR | Status: AC | PRN
  Administered 2021-07-18: 100 mg via INTRAVENOUS

## 2021-07-18 MED ORDER — LACTATED RINGERS IV SOLN: INTRAVENOUS | Status: DC

## 2021-07-18 MED ORDER — POTASSIUM CHLORIDE 10 MEQ/100ML IV SOLN
10.0000 meq | INTRAVENOUS | Status: AC
  Administered 2021-07-18 (×4): 10 meq via INTRAVENOUS
  Filled 2021-07-18 (×3): qty 100

## 2021-07-18 MED ORDER — CHLORHEXIDINE GLUCONATE CLOTH 2 % EX PADS
6.0000 | MEDICATED_PAD | Freq: Every day | CUTANEOUS | Status: DC
  Administered 2021-07-18 – 2021-07-28 (×10): 6 via TOPICAL

## 2021-07-18 MED ORDER — ORAL CARE MOUTH RINSE
15.0000 mL | OROMUCOSAL | Status: DC
  Administered 2021-07-18 – 2021-07-20 (×15): 15 mL via OROMUCOSAL

## 2021-07-18 MED ORDER — FENTANYL BOLUS VIA INFUSION
25.0000 ug | INTRAVENOUS | Status: DC | PRN
  Filled 2021-07-18: qty 25

## 2021-07-18 MED ORDER — EPINEPHRINE 1 MG/10ML IJ SOSY
PREFILLED_SYRINGE | INTRAMUSCULAR | Status: AC | PRN
  Administered 2021-07-18: 1 mg via INTRAVENOUS

## 2021-07-18 MED ORDER — SODIUM CHLORIDE 0.9 % IV SOLN
INTRAVENOUS | Status: AC | PRN
Start: 2021-07-18 — End: 2021-07-18
  Administered 2021-07-18: 1000 mL via INTRAVENOUS

## 2021-07-18 MED ORDER — METHYLPREDNISOLONE SODIUM SUCC 125 MG IJ SOLR
60.0000 mg | Freq: Every day | INTRAMUSCULAR | Status: DC
  Administered 2021-07-19: 60 mg via INTRAVENOUS
  Filled 2021-07-18: qty 2

## 2021-07-18 MED ORDER — SODIUM BICARBONATE 8.4 % IV SOLN
INTRAVENOUS | Status: AC | PRN
  Administered 2021-07-18: 50 meq via INTRAVENOUS

## 2021-07-18 MED ORDER — CALCIUM GLUCONATE-NACL 2-0.675 GM/100ML-% IV SOLN
2.0000 g | Freq: Once | INTRAVENOUS | Status: AC
  Administered 2021-07-18: 2000 mg via INTRAVENOUS
  Filled 2021-07-18: qty 100

## 2021-07-18 MED ORDER — AMIODARONE HCL IN DEXTROSE 360-4.14 MG/200ML-% IV SOLN
30.0000 mg/h | INTRAVENOUS | Status: DC
  Administered 2021-07-18 – 2021-07-22 (×10): 30 mg/h via INTRAVENOUS
  Filled 2021-07-18 (×9): qty 200

## 2021-07-18 MED ORDER — AMIODARONE HCL IN DEXTROSE 360-4.14 MG/200ML-% IV SOLN
60.0000 mg/h | INTRAVENOUS | Status: AC
  Administered 2021-07-18 (×2): 60 mg/h via INTRAVENOUS
  Filled 2021-07-18: qty 200

## 2021-07-18 MED ORDER — FAMOTIDINE IN NACL 20-0.9 MG/50ML-% IV SOLN: 20.0000 mg | Freq: Two times a day (BID) | INTRAVENOUS | Status: DC

## 2021-07-18 MED ORDER — EPINEPHRINE 1 MG/10ML IJ SOSY
PREFILLED_SYRINGE | INTRAMUSCULAR | Status: DC | PRN
  Administered 2021-07-18: .5 mg via INTRAVENOUS

## 2021-07-18 NOTE — Consult Note (Signed)
Cardiology Consultation:   Patient ID: [name] MRN: [id number]; DOB: [birth year]  Admit date: 07/17/2021 Date of Consult: 07/18/2021  PCP:  Nicholes Rough, PA-C   [facility] Providers Cardiologist:  Fransico Him, MD  Advanced Heart Failure:  Glori Bickers, MD       Patient Profile:   Robin Estes is a 75 y.o. female with a hx of interstitial cystitis, sarcoidosis (eye, lung, and skin), osteopenia, MAT, HTN, MR, pulmonary nodules on CT 0000000, and  systolic heart failure. who is being seen 07/18/2021 for the evaluation of cardiac arrest/Vfib at the request of Dr. Wyvonnia Dusky.   History of Present Illness:   Robin Estes is a 74 y.o. female with a hx of interstitial cystitis, sarcoidosis (eye, lung, and skin), osteopenia, MAT, HTN, MR, pulmonary nodules on CT 0000000, and  systolic heart failure. who is being seen 07/18/2021 for the evaluation of cardiac arrest/Vfib at the request of Dr. Wyvonnia Dusky. She had an episode of syncope at home around 6 pm. Then came to the ED; was in the triage/waiting room for a few hours where she had an event of cardiac arrest/Vfib (CPR time approx 5 minutes; 1 dose of epi; she was successfully shocked out of it). Post ROSC she had MAT/Afib on the EKG with no signs of ST elevation, and soft Bps. Levophed was started and Cardiology was consulted. Her initial labs showed K=2.7; and her troponin in the waiting area was 16. She is currently intubated and being started on sedation with versed and fentanyl. She has a history of sarcoidosis with concerns of cardiac sarcoidosis. Currently, she is in MAT with HR 80s and SBP 110 with 10 of levophed. Other labs are pending.    Past Medical History:  Diagnosis Date   Bladder spasms    Cardiomegaly 03/15/2019   CHF (congestive heart failure) (HCC)    Chronic interstitial cystitis    Combined systolic and diastolic heart failure (HCC)    Dysuria    Elevated troponin 03/15/2019   End-stage glaucoma    RIGHT EYE    Frequency of urination    Legally blind in right eye, as defined in [place]    SECONDARY TO GLAUCOMA   Lymphadenopathy, hilar 03/15/2019   Multifocal atrial tachycardia (HCC)    Nocturia    Pleural effusion 03/15/2019   Prolonged QT interval 03/15/2019   Pulmonary sarcoidosis ([place]) 07/09/2019   Sarcoidosis 03/15/2019   Sarcoidosis of skin    Sensation of pressure in bladder area    Solitary pulmonary nodule 07/09/2019   Urgency of urination     Past Surgical History:  Procedure Laterality Date   CATARACT EXTRACTION W/ INTRAOCULAR LENS IMPLANT Left    CYSTOSCOPY WITH HYDRODISTENSION AND BIOPSY N/A 04/21/2013   Procedure: CYSTOSCOPY/BLADDER BIOPSY/HYDRODISTENSION;  Surgeon: Ailene Rud, MD;  Location: [facility];  Service: Urology;  Laterality: N/A;   TEE WITHOUT CARDIOVERSION N/A 04/08/2020   Procedure: TRANSESOPHAGEAL ECHOCARDIOGRAM (TEE);  Surgeon: Jolaine Artist, MD;  Location: [facility] ENDOSCOPY;  Service: Cardiovascular;  Laterality: N/A;       Inpatient Medications: Scheduled Meds:  midazolam       Continuous Infusions:  fentaNYL infusion INTRAVENOUS 25 mcg/hr (07/18/21 0247)   magnesium sulfate bolus IVPB     norepinephrine     potassium chloride 10 mEq (07/18/21 0309)   sodium chloride     PRN Meds: fentaNYL, midazolam, midazolam  Allergies:    Allergies  Allergen Reactions   Cinnamon Other (See Comments)    MOUTH  ULCERS    Social History:   Social History   Socioeconomic History   Marital status: Married    Spouse name: Not on file   Number of children: Not on file   Years of education: Not on file   Highest education level: Not on file  Occupational History   Not on file  Tobacco Use   Smoking status: Never   Smokeless tobacco: Never  Substance and Sexual Activity   Alcohol use: No   Drug use: No   Sexual activity: Not on file  Other Topics Concern   Not on file  Social History Narrative   Not on file   Social Determinants of  Health   Financial Resource Strain: High Risk   Difficulty of Paying Living Expenses: Hard  Food Insecurity: No Food Insecurity   Worried About Running Out of Food in the Last Year: Never true   Ran Out of Food in the Last Year: Never true  Transportation Needs: No Transportation Needs   Lack of Transportation (Medical): No   Lack of Transportation (Non-Medical): No  Physical Activity: Not on file  Stress: Not on file  Social Connections: Not on file  Intimate Partner Violence: Not on file    Family History:    Family History  Problem Relation Age of Onset   CAD Father    Heart attack Father    Sarcoidosis Other    Healthy Sister    Hyperlipidemia Sister    Healthy Sister      ROS:  Please see the history of present illness.  All other ROS reviewed and negative.     Physical Exam/Data:   Vitals:   07/18/21 0230 07/18/21 0230 07/18/21 0237 07/18/21 0245  BP: (!) 64/55  96/71   Pulse: 92     Resp: (!) 21  16   Temp:      SpO2: 100% 100%  100%  Height:  5\' 2"  (1.575 m)     No intake or output data in the 24 hours ending 07/18/21 0311 Last 3 Weights 04/18/2021 05/02/2020 04/22/2020  Weight (lbs) 137 lb 9.6 oz 123 lb 121 lb  Weight (kg) 62.415 kg 55.792 kg 54.885 kg     Body mass index is 25.17 kg/m.  General:  intubated and sedated.  HEENT: normal Neck: no JVD Vascular: No carotid bruits; Distal pulses 2+ bilaterally Cardiac:  tachycardic; S1, S2; RRR; no murmur  Lungs:  mechanical breath sounds.  Abd: soft, nontender, no hepatomegaly  Ext: no edema Neuro/Psych: Intubated/sedated. Cannot assess  EKG:  The EKG was personally reviewed and demonstrates no signs of acute ischemia including STEMI. EKG consistent with MAT/Afib.   Laboratory Data:  High Sensitivity Troponin:   Recent Labs  Lab 07/17/21 2216 07/18/21 0015  TROPONINIHS 16 18*     Chemistry Recent Labs  Lab 07/17/21 2216 07/17/21 2227 07/18/21 0248  NA 141 143 143  K 2.8* 2.7* 3.2*  CL 103  102 103  CO2 27  --   --   GLUCOSE 113* 114* 121*  BUN <5* 3* 5*  CREATININE 0.83 0.80 0.70  CALCIUM 8.2*  --   --   GFRNONAA >60  --   --   ANIONGAP 11  --   --     Recent Labs  Lab 07/17/21 2216  PROT 6.3*  ALBUMIN 2.9*  AST 41  ALT 21  ALKPHOS 123  BILITOT 1.3*   Lipids No results for input(s): CHOL, TRIG, HDL, LABVLDL, LDLCALC, CHOLHDL  in the last 168 hours.  Hematology Recent Labs  Lab 07/17/21 2216 07/17/21 2227 07/18/21 0248  WBC 4.3  --   --   RBC 3.21*  --   --   HGB 9.4* 9.9* 11.6*  HCT 30.4* 29.0* 34.0*  MCV 94.7  --   --   MCH 29.3  --   --   MCHC 30.9  --   --   RDW 15.1  --   --   PLT 320  --   --    Thyroid No results for input(s): TSH, FREET4 in the last 168 hours.  BNPNo results for input(s): BNP, PROBNP in the last 168 hours.  DDimer No results for input(s): DDIMER in the last 168 hours.   Radiology/Studies:  DG Chest Portable 1 View  Result Date: 07/18/2021 CLINICAL DATA:  Intubation EXAM: PORTABLE CHEST 1 VIEW COMPARISON:  03/18/2019 FINDINGS: The tip of the endotracheal tube is just within the right mainstem bronchus. Mild bilateral parahilar opacities. Normal cardiomediastinal contours. IMPRESSION: Endotracheal tube tip just within the right mainstem bronchus. Retraction by approximately 3 cm should place the tip at the level of the clavicular heads. These results will be called to the ordering clinician or representative by the Radiologist Assistant, and communication documented in the PACS or [employer]. Electronically Signed   By: Ulyses Jarred M.D.   On: 07/18/2021 03:01     Assessment and Plan:   # Cardiac arrest # Syncope # Vfib # Systolic heart failure # ?Cardiac Sarcoidosis # Multi-focal atrial tachycardia  -Vfib in the setting of K=2.7. Most likely driven by electrolytes, known history of HF/? Scar from cardiac sarcoidosis. Less likely ischemic with initial troponin of 16 in the ED; however will need LHC in AM to r/o  ischemia. Current EKG has no signs of ischemia so do not believe needs emergent LHC esp with K=2.7 which will make her more pro-arrhenogenic. -LHC in AM -Cardiac MRI in AM to assess EF and scar burden -Aggressive repletion of electrolytes (K and Mg) -Continue with Amiodarone drip -Vent management per critical care team -Trend lactate and mixed venous CO2 from central line to ensure not going in cardiogenic shock -Wean Levophed to MAP goal of 65-75 -Will hold HF medications for now including SGLT-2, Aldactone.  -Repeat EKG -Will need ICD before discharge  For questions or updates, please contact [place] Please consult [email or web address] for contact info under    Signed, Jaci Lazier, MD  07/18/2021 3:11 AM

## 2021-07-18 NOTE — Consult Note (Addendum)
ELECTROPHYSIOLOGY CONSULT NOTE    Patient ID: Robin Estes MRN: [id number], DOB/AGE: 02/10/1946 75 y.o.  Admit date: 07/17/2021 Date of Consult: 07/18/2021  Primary Physician: Nicholes Rough, PA-C Primary Cardiologist: Fransico Him, MD  Electrophysiologist: New  Referring Provider: Dr. Haroldine Laws   Patient Profile: Robin Estes is a 75 y.o. female with a history of sarcoidosis (eye, lung, and skin), osteopenia, MAT, HTN, MR, pulmonary nodules on CT, and chronic systolic CHF who is being seen today for the evaluation of VF at the request of Dr. Haroldine Laws.  HPI:  Robin Estes is a 75 y.o. female with complicated medical history as above.   Seen in HF clinic 03/2021 and was doing well. She was active and watching her 9 mo granddaughter during the day. Sliding scale diuretics for her lasix. Daughter assisted with pill boxes and was tolerated well.   11/17, pt had an episode of syncope at home around 6 pm. She presented to [facility], was in the triage/waiting room for a few hours where she had an event of cardiac arrest/Vfib (CPR time approx 5 minutes; 1 dose of epi; she was successfully shocked out of it). Post ROSC she had MAT/Afib on the EKG with no signs of ST elevation, and soft Bps. Levophed was started and Cardiology was consulted. Her initial labs showed K=2.7; and her troponin in the waiting area was 16. Intubated and sedated to protect airway with recurrent arrhythmias.   Pertinent labs on admission include WBC 4.3, Hgb 9.4, K 2.8, Cr 0.83, Mg 1.6  Currently she is intubated and sedated and history is obtained from the chart. Has two children present, but both say their sister is more familiar with their moms care.  On my exam, VF has been quiescent since shock ~ 0630. Lidocaine started  Past Medical History:  Diagnosis Date   Bladder spasms    Cardiomegaly 03/15/2019   CHF (congestive heart failure) (HCC)    Chronic interstitial cystitis    Combined systolic and diastolic  heart failure (HCC)    Dysuria    Elevated troponin 03/15/2019   End-stage glaucoma    RIGHT EYE   Frequency of urination    Legally blind in right eye, as defined in [place]    SECONDARY TO GLAUCOMA   Lymphadenopathy, hilar 03/15/2019   Multifocal atrial tachycardia (HCC)    Nocturia    Pleural effusion 03/15/2019   Prolonged QT interval 03/15/2019   Pulmonary sarcoidosis ([place]) 07/09/2019   Sarcoidosis 03/15/2019   Sarcoidosis of skin    Sensation of pressure in bladder area    Solitary pulmonary nodule 07/09/2019   Urgency of urination      Surgical History:  Past Surgical History:  Procedure Laterality Date   CATARACT EXTRACTION W/ INTRAOCULAR LENS IMPLANT Left    CYSTOSCOPY WITH HYDRODISTENSION AND BIOPSY N/A 04/21/2013   Procedure: CYSTOSCOPY/BLADDER BIOPSY/HYDRODISTENSION;  Surgeon: Ailene Rud, MD;  Location: [facility];  Service: Urology;  Laterality: N/A;   TEE WITHOUT CARDIOVERSION N/A 04/08/2020   Procedure: TRANSESOPHAGEAL ECHOCARDIOGRAM (TEE);  Surgeon: Jolaine Artist, MD;  Location: [facility] ENDOSCOPY;  Service: Cardiovascular;  Laterality: N/A;     (Not in a hospital admission)   Inpatient Medications:   heparin  5,000 Units Subcutaneous Q8H   [START ON 07/19/2021] methylPREDNISolone (SOLU-MEDROL) injection  60 mg Intravenous Daily    Allergies:  Allergies  Allergen Reactions   Cinnamon Other (See Comments)    MOUTH ULCERS    Social History   Socioeconomic History  Marital status: Married    Spouse name: Not on file   Number of children: Not on file   Years of education: Not on file   Highest education level: Not on file  Occupational History   Not on file  Tobacco Use   Smoking status: Never   Smokeless tobacco: Never  Substance and Sexual Activity   Alcohol use: No   Drug use: No   Sexual activity: Not on file  Other Topics Concern   Not on file  Social History Narrative   Not on file   Social Determinants of Health    Financial Resource Strain: High Risk   Difficulty of Paying Living Expenses: Hard  Food Insecurity: No Food Insecurity   Worried About Running Out of Food in the Last Year: Never true   Ran Out of Food in the Last Year: Never true  Transportation Needs: No Transportation Needs   Lack of Transportation (Medical): No   Lack of Transportation (Non-Medical): No  Physical Activity: Not on file  Stress: Not on file  Social Connections: Not on file  Intimate Partner Violence: Not on file     Family History  Problem Relation Age of Onset   CAD Father    Heart attack Father    Sarcoidosis Other    Healthy Sister    Hyperlipidemia Sister    Healthy Sister      Review of Systems: All other systems reviewed and are otherwise negative except as noted above.  Physical Exam: Vitals:   07/18/21 0810 07/18/21 0815 07/18/21 0817 07/18/21 0820  BP: 128/76 126/74  124/82  Pulse: (!) 53 (!) 53  61  Resp: 16 18  18   Temp:      SpO2: 100% 100% 100% 100%  Height:        GEN- The patient is intubated and sedated.  HEENT- + ET tube.  Lungs- +mechanical breathing sounds.  Heart- Regular rate and rhythm, no murmurs, rubs or gallops GI- soft Extremities- no clubbing or cyanosis. No edema Neuro- intubated and sedated.    Labs:   Lab Results  Component Value Date   WBC 7.6 07/18/2021   HGB 8.8 (L) 07/18/2021   HCT 26.0 (L) 07/18/2021   MCV 96.0 07/18/2021   PLT 376 07/18/2021    Recent Labs  Lab 07/18/21 0229 07/18/21 0248 07/18/21 0652  NA 140   < > 141  K 3.1*   < > 3.3*  CL 104   < > 104  CO2 21*  --  21*  BUN 6*   < > 6*  CREATININE 0.93   < > 0.90  CALCIUM 7.9*  --  8.4*  PROT 5.8*  --   --   BILITOT 1.4*  --   --   ALKPHOS 127*  --   --   ALT 25  --   --   AST 56*  --   --   GLUCOSE 133*   < > 172*   < > = values in this interval not displayed.      Radiology/Studies: CT Head Wo Contrast  Result Date: 07/18/2021 CLINICAL DATA:  Altered mental status. EXAM:  CT HEAD WITHOUT CONTRAST TECHNIQUE: Contiguous axial images were obtained from the base of the skull through the vertex without intravenous contrast. COMPARISON:  None. FINDINGS: Brain: There is mild atrophy, small vessel disease and atrophic ventriculomegaly. There is no midline shift. No asymmetry is seen concerning for an acute infarct, hemorrhage or mass. There is  mild streak artifact on some of the lower slices due to overlying material. Basal cisterns are clear. Vascular: There are patchy calcifications in the carotid siphons, distal vertebral arteries. There are no hyperdense central vessels. Skull: Normal. Negative for fracture or focal lesion. Sinuses/Orbits: Visualized orbital contents are unremarkable. There is patchy membrane thickening in the ethmoid air cells. Other visualized sinuses and bilateral mastoid air cells are clear with maxillary sinuses not included. Other: The patient is intubated. IMPRESSION: No acute intracranial CT findings. Chronic change. Vascular calcifications. Electronically Signed   By: Almira Bar M.D.   On: 07/18/2021 05:15   CT Angio Chest PE W and/or Wo Contrast  Result Date: 07/18/2021 CLINICAL DATA:  Cardiac arrest, VFib, high probability of pulmonary embolism. History of sarcoidosis according to the history from the prior chest CT with contrast. EXAM: CT ANGIOGRAPHY CHEST WITH CONTRAST TECHNIQUE: Multidetector CT imaging of the chest was performed using the standard protocol during bolus administration of intravenous contrast. Multiplanar CT image reconstructions and MIPs were obtained to evaluate the vascular anatomy. CONTRAST:  36mL OMNIPAQUE IOHEXOL 350 MG/ML SOLN COMPARISON:  Portable chest from earlier today and CT chest with contrast 07/26/2019 FINDINGS: Cardiovascular: The pulmonary arteries are normal in caliber and well opacified through the segmental divisions. No embolus is seen. Subsegmental arteries are not well opacified. The thoracic aorta is normal  in caliber with mild patchy calcific plaques in the arch and descending portions common descending segment tortuosity. Aortic enhancement is insufficient to assess its lumen. Central pulmonary veins are mildly distended but no more than previously. There is mild cardiomegaly with slight reflux into the IVC and panchamber involvement, small increased pericardial effusion compared to the prior study. Mediastinum/Nodes: Right mainstem bronchus intubation. This was seen on today's chest x-ray. The tube should be withdrawn 5 cm for optimal placement. Extensive mediastinal and hilar adenopathy is similar to the prior study and some lymph nodes as before containing calcifications consistent with granulomatous disease process. This was seen previously and there has been no worsening in the adenopathy but no improvement either. 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 No supraclavicular or axillary adenopathy is seen. Lungs/Pleura: There are multiple bilateral irregular pulmonary nodules, most numerous in the right upper lobe and most of them are in a peribronchovascular distribution which is commonly seen with sarcoid disease. No new or enlarging nodules are observed. Individual right upper lobe nodules measure up to 1.1 cm in size. Other scattered nodules in the remaining lungs are smaller. There are increased posterior pleural-parenchymal opacities in the right lower lobe which are probably due to atelectasis, and increased dense consolidation in the left lower lobe, potentially related to the right main bronchus intubation or potentially due to pneumonia. There is increased subpleural septal thickening in the apices and bases which could be progression of interstitial lung disease or interstitial edema. Similarly there is increased perihilar ground-glass haziness in the upper lobes which could be due to interstitial disease or edema. Upper Abdomen: No acute  abnormality. Musculoskeletal: No chest wall abnormality. No acute or significant osseous findings. Review of the MIP images confirms the above findings. IMPRESSION: 1. No arterial embolism is seen through the segmental arteries. The subsegmental arteries are not well seen. 2. Right main bronchus intubation. Withdraw tube 5 cm for optimal position. 3. Dense consolidation with air bronchograms in the left lower lobe which could be due to the right main bronchus intubation or  consolidation of other etiology. 4. No change in extensive mediastinal and hilar adenopathy and lung nodules presumably sarcoid related. 5. Interstitial changes in the lung apices and bases which could be due to edema or progression of interstitial lung disease related to sarcoid. No pleural effusion is seen. 6. Cardiomegaly with mildly distended pulmonary veins. 7. 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 Follow-up as indicated. Electronically Signed   By: Telford Nab M.D.   On: 07/18/2021 05:42   DG Chest Portable 1 View  Result Date: 07/18/2021 CLINICAL DATA:  ETT adjustment EXAM: PORTABLE CHEST 1 VIEW COMPARISON:  Earlier same day FINDINGS: 0555 hours. Endotracheal tube tip has been repositioned 3.5 cm above the base of the carina. The NG tube passes into the stomach although the distal tip position is not included on the film. The cardio pericardial silhouette is enlarged. Substantial fullness in the right hilar region compatible with mediastinal and hilar lymphadenopathy seen on recent chest CT. The visualized bony structures of the thorax show no acute abnormality. Telemetry leads overlie the chest. IMPRESSION: 1. Endotracheal tube tip has been repositioned, now 3.5 cm above the base of the carina. Electronically Signed   By: Misty Stanley M.D.   On: 07/18/2021 06:22   DG Chest Portable 1 View  Result Date: 07/18/2021 CLINICAL DATA:  Intubation EXAM: PORTABLE CHEST 1 VIEW COMPARISON:  03/18/2019 FINDINGS: The tip of the  endotracheal tube is just within the right mainstem bronchus. Mild bilateral parahilar opacities. Normal cardiomediastinal contours. IMPRESSION: Endotracheal tube tip just within the right mainstem bronchus. Retraction by approximately 3 cm should place the tip at the level of the clavicular heads. These results Tay Whitwell be called to the ordering clinician or representative by the Radiologist Assistant, and communication documented in the PACS or [employer]. Electronically Signed   By: Ulyses Jarred M.D.   On: 07/18/2021 03:01   [place] EKG SITE RITE  Result Date: 07/18/2021 If Site Rite image not attached, placement could not be confirmed due to current cardiac rhythm.   EKG: EKG on arrival appears to show NSR at 73  with prolonged QT > 550 ms (personally reviewed) Baseline EKG 04/18/2021 showed NSR at 74 bpm with stable QT  TELEMETRY: Multiple episodes of PMVT/VF, has been quiescent since ~0630 on my exam. Currently sinus brady/NSR 50-60s (personally reviewed)  Assessment/Plan: 1.  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  Mg 2.3 and K 3.3 currently with sup-ongoing and rechecks at noon.  Echo pending. Last EF 40-45% by TEE 07/2020 (40-45% by TTE 08/2019) Airam Runions likely need ICD if VT remains quiescent, pending course. cMRI 04/2019 showed "Possible subtle mid-wall LGE in the mid inferior wall (not seen on long axis views). This could be consistent with cardiac sarcoidosis. This is not consistent with prior MI."  2. Acute on chronic systolic CHF Followed chronically by CHF team who are to follow as well.   3. Intubated In setting of recurrent VF episodes Appreciate CCM care.  4. Sarcoidosis Biopsy proven, follows with Dr. Loanne Drilling in pulmonary Skin, Pulm, and eye involvement per notes. ? Cardiac involvement She has been started on steroids as well by CCM.  5.  GOC Given multiple co-morbidities GOC discussion can be had once patient is awake and alert. Her primary care-giver has not yet arrived on my exam this am. Both siblings present currently say their sister is  more familiar with her care.   For questions or updates, please contact [facility] Please consult [email or web address] for contact info under Cardiology/STEMI.  Signed, Graciella Freer, PA-C  07/18/2021 8:50 AM   I have seen and examined this patient with Otilio Saber.  Agree with above, note added to reflect my findings.  On exam, RRR, no murmurs, intubated, sedated, no JVD.  Patient presented to the hospital after an episode of syncope while at home.  While in the emergency room waiting room, she had another episode of syncope.  She was found to be in ventricular fibrillation.  She received external defibrillator shocks and CPR for approximately 5 minutes.  She was intubated and sedated for airway protection.  She is currently on amiodarone and lidocaine.  Her potassium was quite low, but her troponins were not elevated.  She does have a history of sarcoidosis and a possible history of cardiac sarcoidosis.  She has not had a PET scan, but did have an MRI that was suggestive of sarcoidosis.  She is currently on steroids as well.  We Deontrae Drinkard continue with current management.  Agree with left heart catheterization to rule out ischemia.  If she has no further ventricular arrhythmias, would stop lidocaine and continue with amiodarone.  We Magnolia Mattila continue to evaluate, but she may require ICD implant prior to discharge.  Mckynzie Liwanag M. Shanan Mcmiller MD 07/18/2021 12:45 PM

## 2021-07-18 NOTE — H&P (Signed)
NAME:  Robin Estes, MRN:  [id number], DOB:  1946/05/28, LOS: 0 ADMISSION DATE:  07/17/2021, CONSULTATION DATE:  11/18 REFERRING MD:  Dr. Wyvonnia Dusky, CHIEF COMPLAINT:  VF arrest   History of Present Illness:  75 year old female with PMH as below, which is significant for sarcoidosis (pulmonary, eye, skin) biopsy proven, MAT, HTN, and NICM with LVEF 40-45%. Not felt to have significant cardiac sarcoid involvement at last OV. Presented to [facility] ED 11/18 after suffering a syncopal event at home. While in the waiting room she unfortunately suffered a VF cardiac arrest. Arrest was relatively brief (approximately 5 mins of CPR) until she was ultimately defibrillated into ROSC. Laboratory evaluation significant for K 2.7 and mag 1.6. Evaluated by cardiology in the ED who felt the electrolyte abnormalities were the likely etiology of the arrest. The patient was of course intubated as a result of the cardiac arrest, and PCCM was asked to admit. Post arrest she has been making purposeful movement per ED staff.   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   Significant Hospital Events: Including procedures, antibiotic start and stop dates in addition to other pertinent events   11/18 admit s/p VF arrest.   Interim History / Subjective:    Objective   Blood pressure  117/69, pulse 61, temperature 97.7 F (36.5 C), resp. rate (!) 24, height 5\' 2"  (1.575 m), SpO2 100 %.    Vent Mode: PRVC FiO2 (%):  [100 %] 100 % Set Rate:  [24 bmp] 24 bmp Vt Set:  [400 mL] 400 mL PEEP:  [5 cmH20] 5 cmH20  No intake or output data in the 24 hours ending 07/18/21 0414 There were no vitals filed for this visit.  Examination: General: Thin elderly female on vent HENT: [place]/AT, no JVD Lungs: Coarse rhonchi throughout Cardiovascular: RRR, no MRG Abdomen: Soft, non-distended Extremities: No acute deformity Neuro: Sedated GU: foley  Resolved Hospital Problem list     Assessment & Plan:   VF cardiac arrest: felt to be secondary to hypokalemia, hypomagnesemia, hypocalcemia. Known NICM and risk of cardiac sarcoid. Has had additional episodes of VF in ED which have so far responded quickly to defibrillation.  - Admit to ICU - Amiodarone infusion - Telemetry monitoring - Cardiology following - Aggressive repletion to keep K > 4 and Mag > 2. Give Ca. MIVF with LR.   Cardiogenic shock: HFrEF: LVEF 40-45% MAT - Norepinephrine to keep MAP 65 (currently on 10 mcg with MAPs 80s) - Echocardiogram - Cardiology following, recommending LHC, Cardiac MRI - Holding home lasix, entresto, metoprolol, spironolactone  Sarcoidosis (skin, pulm, eye) possible cardiac involvement as well. Some question of this raised on cardiac MRI in 2020.  - Solumedrol - Consider cardiac MRI/PET. Await echo.   Endotracheal tube present: - Full vent support - ABG reviewed and settings adjusted. - Repeat ABG 0600.  - VAP bundle - Fentanyl infusion for RASS 0 to -1.    Best Practice (right click and "Reselect all SmartList Selections" daily)   Diet/type: NPO DVT  prophylaxis: LMWH GI prophylaxis: PPI Lines: N/A Foley:  Yes, and it is still needed Code Status:  full code Last date of multidisciplinary goals of care discussion [ 11/18 ]  Labs   CBC: Recent Labs  Lab 07/17/21 2216  07/17/21 2227 07/18/21 0229 07/18/21 0248  WBC 4.3  --  7.6  --   NEUTROABS 3.2  --  4.9  --   HGB 9.4* 9.9* 9.7* 11.6*  HCT 30.4* 29.0* 31.0* 34.0*  MCV 94.7  --  96.0  --   PLT 320  --  376  --     Basic Metabolic Panel: Recent Labs  Lab 07/17/21 2216 07/17/21 2227 07/18/21 0229 07/18/21 0248  NA 141 143 140 143  K 2.8* 2.7* 3.1* 3.2*  CL 103 102 104 103  CO2 27  --  21*  --   GLUCOSE 113* 114* 133* 121*  BUN <5* 3* 6* 5*  CREATININE 0.83 0.80 0.93 0.70  CALCIUM 8.2*  --  7.9*  --   MG  --   --  1.6*  --    GFR: CrCl cannot be calculated (Unknown ideal weight.). Recent Labs  Lab 07/17/21 2216 07/18/21 0229  WBC 4.3 7.6  LATICACIDVEN  --  5.4*    Liver Function Tests: Recent Labs  Lab 07/17/21 2216 07/18/21 0229  AST 41 56*  ALT 21 25  ALKPHOS 123 127*  BILITOT 1.3* 1.4*  PROT 6.3* 5.8*  ALBUMIN 2.9* 2.7*   No results for input(s): LIPASE, AMYLASE in the last 168 hours. No results for input(s): AMMONIA in the last 168 hours.  ABG    Component Value Date/Time   TCO2 26 07/18/2021 0248     Coagulation Profile: No results for input(s): INR, PROTIME in the last 168 hours.  Cardiac Enzymes: No results for input(s): CKTOTAL, CKMB, CKMBINDEX, TROPONINI in the last 168 hours.  HbA1C: Hgb A1c MFr Bld  Date/Time Value Ref Range Status  03/16/2019 02:25 AM 5.6 4.8 - 5.6 % Final    Comment:    (NOTE) Pre diabetes:          5.7%-6.4% Diabetes:              >6.4% Glycemic control for   <7.0% adults with diabetes     CBG: Recent Labs  Lab 07/17/21 2153 07/18/21 0218  GLUCAP 109* 94    Review of Systems:   Patient is encephalopathic and/or intubated. Therefore history has been obtained from chart review.    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   Surgical History:   Past Surgical History:  Procedure Laterality Date   CATARACT EXTRACTION W/ INTRAOCULAR LENS IMPLANT Left    CYSTOSCOPY WITH HYDRODISTENSION AND BIOPSY N/A 04/21/2013   Procedure: CYSTOSCOPY/BLADDER BIOPSY/HYDRODISTENSION;  Surgeon: Ailene Rud, MD;  Location: [facility];  Service: Urology;  Laterality: N/A;   TEE WITHOUT CARDIOVERSION N/A 04/08/2020   Procedure: TRANSESOPHAGEAL ECHOCARDIOGRAM (TEE);  Surgeon: Jolaine Artist, MD;  Location: [facility];  Service: Cardiovascular;  Laterality: N/A;     Social History:   reports that she has never smoked. She has never used smokeless tobacco. She reports that she does not drink alcohol and does not use drugs.   Family History:  Her family history includes CAD in her father; Healthy in her sister and sister; Heart attack in her father; Hyperlipidemia in her sister; Sarcoidosis in an other family member.   Allergies Allergies  Allergen Reactions   Cinnamon Other (See Comments)    MOUTH ULCERS     Home Medications  Prior to Admission medications   Medication Sig Start Date End Date Taking? Authorizing Provider  atorvastatin (LIPITOR) 20 MG tablet TAKE 1 TABLET BY MOUTH ONCE DAILY AT 6 P.M. 09/27/20   Quintella Reichert, MD  dapagliflozin propanediol (FARXIGA) 10 MG TABS tablet Take 1 tablet (10 mg total) by mouth daily before breakfast. 04/18/21   Bensimhon, Bevelyn Buckles, MD  furosemide (LASIX) 20 MG tablet Take 1 tablet (20 mg total) by mouth as  needed for fluid. 05/06/21   Bensimhon, Bevelyn Buckles, MD  guaiFENesin (MUCINEX) 600 MG 12 hr tablet Take 600 mg by mouth daily.    [provider]  metoprolol succinate (TOPROL-XL) 50 MG 24 hr tablet TAKE 1 TABLET BY MOUTH ONCE DAILY. TAKE  WITH  OR  IMMEDIATELY  FOLLOWING  A  MEAL. 06/13/21   Quintella Reichert, MD  Multiple Vitamins-Minerals (MULTIVITAMIN WITH MINERALS) tablet Take 1 tablet by mouth daily.    [provider]  potassium chloride SA (KLOR-CON) 20 MEQ tablet TAKE 1 TABLET BY MOUTH ONCE DAILY . APPOINTMENT REQUIRED FOR FUTURE REFILLS 03/19/21   Bensimhon, Bevelyn Buckles, MD  sacubitril-valsartan (ENTRESTO) 97-103 MG Take 1 tablet by mouth 2 (two) times daily. Needs appt for further refills 12/31/20   Bensimhon, Bevelyn Buckles, MD  spironolactone (ALDACTONE) 25 MG tablet Take 1 tablet (25 mg total) by mouth daily. 09/02/20   Bensimhon, Bevelyn Buckles, MD     Critical care time: 50 minutes      Joneen Roach, AGACNP-BC [facility]  See Amion for personal pager PCCM on call pager [phone] until 7pm. Please call Elink 7p-7a. [phone]  07/18/2021 4:14 AM

## 2021-07-18 NOTE — ED Notes (Signed)
Patient shocked x1 for vFib , converted to NSR , admitting MD at bedside .

## 2021-07-18 NOTE — Progress Notes (Signed)
Transported Pt from ED to [facility] room 1. Pt on vent, on full support. RN at bedside. No complications.

## 2021-07-18 NOTE — Progress Notes (Signed)
Chaplain called for family support.  Upon arrival, patient's son outside the room and emotional saying this is hard for me to see and that his sister, at bedside, was the strong one.  Sister and sister in law at bedside.  Chaplain offered space for family to share what led up to coming to the hospital.  Family is close in relationship and spend a lot of time together.  Chaplain built rapport, offered hospitality and prayer.  Patient to be admitted and chaplain will have a follow up visit with spiritual care tomorrow.  Chaplain available as needed Chaplain Butch Penny.    07/18/21 8309  Clinical Encounter Type  Visited With Patient and family together;Health care provider  Visit Type ED;Critical Care  Referral From Nurse  Consult/Referral To Chaplain  Spiritual Encounters  Spiritual Needs Prayer

## 2021-07-18 NOTE — Code Documentation (Signed)
Family at beside. Family given emotional support. 

## 2021-07-18 NOTE — ED Notes (Signed)
Echo at bedside

## 2021-07-18 NOTE — Code Documentation (Signed)
Chaplain at bedside with patient family.

## 2021-07-18 NOTE — ED Provider Notes (Signed)
[facility] EMERGENCY DEPARTMENT Provider Note   CSN: [id number] Arrival date & time: 07/17/21  2147     History Chief Complaint  Patient presents with   Syncope    Robin Estes is a 75 y.o. female.  Level 5 caveat for unresponsiveness and acuity of condition.  Patient brought back from waiting room unresponsive and "passed out".  Had arrived around 10 PM after episode of syncope at home.  Patient found to be slumped over in a wheelchair with stiffened extremities per her family. Patient brought back emergently to a room where she was not breathing on her own but did have a pulse. BVM was begun.  Found to be in ventricular fibrillation and shocked once and ACLS begun.  Family reports patient passed out watching television earlier in the day.  Daughter in law reports her extremity stiffened up and eyes rolled back in her head.  She was lowered to the ground but did not stop breathing and did not lose pulses.  She was out for 3 to 4 minutes and EMS was activated.  Daughter-in-law does not think that she hit her head.  Patient with history of combined heart failure, possible cardiac sarcoidosis, mitral regurgitation, multifocal atrial tachycardia. 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 The history is limited by the condition of the patient.      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    Sarcoidosis 03/15/2019   Sarcoidosis of skin    Sensation of pressure in bladder area    Solitary pulmonary nodule 07/09/2019   Urgency of urination     Patient Active Problem List   Diagnosis Date Noted   Mitral regurgitation 04/18/2021   Hypertension 04/18/2021   Sarcoidosis of skin 07/09/2019   Solitary pulmonary nodule 07/09/2019   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   Combined systolic and diastolic heart failure (HCC)    Multifocal atrial tachycardia ([place])    Sarcoidosis 03/15/2019   Pleural effusion 03/15/2019   Elevated troponin 03/15/2019   Cardiomegaly 03/15/2019   Lymphadenopathy, hilar 03/15/2019   Chronic interstitial cystitis 03/15/2019   Sinus tachycardia by electrocardiogram 03/15/2019   Prolonged QT interval 03/15/2019    Past Surgical History:  Procedure Laterality Date   CATARACT EXTRACTION W/ INTRAOCULAR LENS IMPLANT Left    CYSTOSCOPY WITH HYDRODISTENSION AND BIOPSY N/A 04/21/2013   Procedure: CYSTOSCOPY/BLADDER BIOPSY/HYDRODISTENSION;  Surgeon: Ailene Rud, MD;  Location: [facility];  Service: Urology;  Laterality: N/A;   TEE WITHOUT CARDIOVERSION N/A 04/08/2020   Procedure: TRANSESOPHAGEAL ECHOCARDIOGRAM (TEE);  Surgeon: Jolaine Artist, MD;  Location: [facility] ENDOSCOPY;  Service: Cardiovascular;  Laterality: N/A;     OB History   No obstetric history on file.     Family History  Problem Relation Age of Onset   CAD Father    Heart attack Father  Sarcoidosis Other    Healthy Sister    Hyperlipidemia Sister    Healthy Sister     Social History   Tobacco Use   Smoking status: Never   Smokeless tobacco: Never  Substance Use Topics   Alcohol use: No   Drug use: No    Home Medications Prior to Admission medications   Medication Sig Start Date End Date Taking? Authorizing Provider  atorvastatin (LIPITOR) 20 MG tablet TAKE 1 TABLET BY MOUTH ONCE DAILY AT 6 P.M. 09/27/20   Quintella Reichert, MD  dapagliflozin  propanediol (FARXIGA) 10 MG TABS tablet Take 1 tablet (10 mg total) by mouth daily before breakfast. 04/18/21   Bensimhon, Bevelyn Buckles, MD  furosemide (LASIX) 20 MG tablet Take 1 tablet (20 mg total) by mouth as needed for fluid. 05/06/21   Bensimhon, Bevelyn Buckles, MD  guaiFENesin (MUCINEX) 600 MG 12 hr tablet Take 600 mg by mouth daily.    [provider]  metoprolol succinate (TOPROL-XL) 50 MG 24 hr tablet TAKE 1 TABLET BY MOUTH ONCE DAILY. TAKE  WITH  OR  IMMEDIATELY  FOLLOWING  A  MEAL. 06/13/21   Quintella Reichert, MD  Multiple Vitamins-Minerals (MULTIVITAMIN WITH MINERALS) tablet Take 1 tablet by mouth daily.    [provider]  potassium chloride SA (KLOR-CON) 20 MEQ tablet TAKE 1 TABLET BY MOUTH ONCE DAILY . APPOINTMENT REQUIRED FOR FUTURE REFILLS 03/19/21   Bensimhon, Bevelyn Buckles, MD  sacubitril-valsartan (ENTRESTO) 97-103 MG Take 1 tablet by mouth 2 (two) times daily. Needs appt for further refills 12/31/20   Bensimhon, Bevelyn Buckles, MD  spironolactone (ALDACTONE) 25 MG tablet Take 1 tablet (25 mg total) by mouth daily. 09/02/20   Bensimhon, Bevelyn Buckles, MD    Allergies    Cinnamon  Review of Systems   Review of Systems  Unable to perform ROS: Patient unresponsive   Physical Exam Updated Vital Signs BP 96/71   Pulse 92   Temp 97.7 F (36.5 C)   Resp 16   Ht 5\' 2"  (1.575 m)   SpO2 100%   BMI 25.17 kg/m   Physical Exam Constitutional:      Comments: Unresponsive Assisted ventilations performed  Eyes:     Comments:  Left pupil opacity  Cardiovascular:     Comments: Irregular pulses after defibrillation Pulmonary:     Comments: Equal breath sounds with bag Musculoskeletal:        General: No swelling or tenderness. Normal range of motion.  Skin:    General: Skin is warm.     Findings: No rash.  Neurological:     Comments: unresponsive    ED Results / Procedures / Treatments   Labs (all labs ordered are listed, but only abnormal results are displayed) Labs Reviewed   CBC WITH DIFFERENTIAL/PLATELET - Abnormal; Notable for the following components:      Result Value   RBC 3.21 (*)    Hemoglobin 9.4 (*)    HCT 30.4 (*)    Lymphs Abs 0.6 (*)    All other components within normal limits  COMPREHENSIVE METABOLIC PANEL - Abnormal; Notable for the following components:   Potassium 2.8 (*)    Glucose, Bld 113 (*)    BUN <5 (*)    Calcium 8.2 (*)    Total Protein 6.3 (*)    Albumin 2.9 (*)    Total Bilirubin 1.3 (*)    All other components within normal limits  MAGNESIUM - Abnormal; Notable for the following components:  Magnesium 1.6 (*)    All other components within normal limits  LACTIC ACID, PLASMA - Abnormal; Notable for the following components:   Lactic Acid, Venous 5.4 (*)    All other components within normal limits  D-DIMER, QUANTITATIVE - Abnormal; Notable for the following components:   D-Dimer, Quant 5.27 (*)    All other components within normal limits  CBC WITH DIFFERENTIAL/PLATELET - Abnormal; Notable for the following components:   RBC 3.23 (*)    Hemoglobin 9.7 (*)    HCT 31.0 (*)    Abs Immature Granulocytes 0.15 (*)    All other components within normal limits  COMPREHENSIVE METABOLIC PANEL - Abnormal; Notable for the following components:   Potassium 3.1 (*)    CO2 21 (*)    Glucose, Bld 133 (*)    BUN 6 (*)    Calcium 7.9 (*)    Total Protein 5.8 (*)    Albumin 2.7 (*)    AST 56 (*)    Alkaline Phosphatase 127 (*)    Total Bilirubin 1.4 (*)    All other components within normal limits  I-STAT CHEM 8, ED - Abnormal; Notable for the following components:   Potassium 2.7 (*)    BUN 3 (*)    Glucose, Bld 114 (*)    Calcium, Ion 1.03 (*)    Hemoglobin 9.9 (*)    HCT 29.0 (*)    All other components within normal limits  CBG MONITORING, ED - Abnormal; Notable for the following components:   Glucose-Capillary 109 (*)    All other components within normal limits  I-STAT CHEM 8, ED - Abnormal; Notable for the following  components:   Potassium 3.2 (*)    BUN 5 (*)    Glucose, Bld 121 (*)    Calcium, Ion 0.97 (*)    Hemoglobin 11.6 (*)    HCT 34.0 (*)    All other components within normal limits  TROPONIN I (HIGH SENSITIVITY) - Abnormal; Notable for the following components:   Troponin I (High Sensitivity) 18 (*)    All other components within normal limits  TROPONIN I (HIGH SENSITIVITY) - Abnormal; Notable for the following components:   Troponin I (High Sensitivity) 18 (*)    All other components within normal limits  RESP PANEL BY RT-PCR (FLU A&B, COVID) ARPGX2  LACTIC ACID, PLASMA  BRAIN NATRIURETIC PEPTIDE  URINALYSIS, ROUTINE W REFLEX MICROSCOPIC  CBG MONITORING, ED  TYPE AND SCREEN  ABO/RH  TROPONIN I (HIGH SENSITIVITY)  TROPONIN I (HIGH SENSITIVITY)    EKG EKG Interpretation  Date/Time:  Friday July 18 2021 02:22:54 EST Ventricular Rate:  156 PR Interval:  149 QRS Duration: 72 QT Interval:  271 QTC Calculation: 437 R Axis:   67 Text Interpretation: Supraventricular tachycardia Repolarization abnormality, prob rate related Artifact in MAT no STEMI Confirmed by Ezequiel Essex [phone]) on 07/18/2021 3:22:01 AM  Radiology CT Head Wo Contrast  Result Date: 07/18/2021 CLINICAL DATA:  Altered mental status. EXAM: CT HEAD WITHOUT CONTRAST TECHNIQUE: Contiguous axial images were obtained from the base of the skull through the vertex without intravenous contrast. COMPARISON:  None. FINDINGS: Brain: There is mild atrophy, small vessel disease and atrophic ventriculomegaly. There is no midline shift. No asymmetry is seen concerning for an acute infarct, hemorrhage or mass. There is mild streak artifact on some of the lower slices due to overlying material. Basal cisterns are clear. Vascular: There are patchy calcifications in the carotid siphons, distal vertebral arteries. There are  no hyperdense central vessels. Skull: Normal. Negative for fracture or focal lesion. Sinuses/Orbits:  Visualized orbital contents are unremarkable. There is patchy membrane thickening in the ethmoid air cells. Other visualized sinuses and bilateral mastoid air cells are clear with maxillary sinuses not included. Other: The patient is intubated. IMPRESSION: No acute intracranial CT findings. Chronic change. Vascular calcifications. Electronically Signed   By: Telford Nab M.D.   On: 07/18/2021 05:15   CT Angio Chest PE W and/or Wo Contrast  Result Date: 07/18/2021 CLINICAL DATA:  Cardiac arrest, VFib, high probability of pulmonary embolism. History of sarcoidosis according to the history from the prior chest CT with contrast. EXAM: CT ANGIOGRAPHY CHEST WITH CONTRAST TECHNIQUE: Multidetector CT imaging of the chest was performed using the standard protocol during bolus administration of intravenous contrast. Multiplanar CT image reconstructions and MIPs were obtained to evaluate the vascular anatomy. CONTRAST:  35mL OMNIPAQUE IOHEXOL 350 MG/ML SOLN COMPARISON:  Portable chest from earlier today and CT chest with contrast 07/26/2019 FINDINGS: Cardiovascular: The pulmonary arteries are normal in caliber and well opacified through the segmental divisions. No embolus is seen. Subsegmental arteries are not well opacified. The thoracic aorta is normal in caliber with mild patchy calcific plaques in the arch and descending portions common descending segment tortuosity. Aortic enhancement is insufficient to assess its lumen. Central pulmonary veins are mildly distended but no more than previously. There is mild cardiomegaly with slight reflux into the IVC and panchamber involvement, small increased pericardial effusion compared to the prior study. Mediastinum/Nodes: Right mainstem bronchus intubation. This was seen on today's chest x-ray. The tube should be withdrawn 5 cm for optimal placement. Extensive mediastinal and hilar adenopathy is similar to the prior study and some lymph nodes as before containing  calcifications consistent with granulomatous disease process. This was seen previously and there has been no worsening in the adenopathy but no improvement either. 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 No supraclavicular or axillary adenopathy is seen. Lungs/Pleura: There are multiple bilateral irregular pulmonary nodules, most numerous in the right upper lobe and most of them are in a peribronchovascular distribution which is commonly seen with sarcoid disease. No new or enlarging nodules are observed. Individual right upper lobe nodules measure up to 1.1 cm in size. Other scattered nodules in the remaining lungs are smaller. There are increased posterior pleural-parenchymal opacities in the right lower lobe which are probably due to atelectasis, and increased dense consolidation in the left lower lobe, potentially related to the right main bronchus intubation or potentially due to pneumonia. There is increased subpleural septal thickening in the apices and bases which could be progression of interstitial lung disease or interstitial edema. Similarly there is increased perihilar ground-glass haziness in the upper lobes which could be due to interstitial disease or edema. Upper Abdomen: No acute abnormality. Musculoskeletal: No chest wall abnormality. No acute or significant osseous findings. Review of the MIP images confirms the above findings. IMPRESSION: 1. No arterial embolism is seen through the segmental arteries. The subsegmental arteries are not well seen. 2. Right main bronchus intubation. Withdraw tube 5 cm for optimal position. 3. Dense consolidation with air bronchograms in the left lower lobe which could be due to the right main bronchus intubation or consolidation of other etiology. 4. No change in extensive mediastinal and hilar adenopathy and lung nodules presumably sarcoid related. 5. Interstitial changes in the lung apices and bases  which could  be due to edema or progression of interstitial lung disease related to sarcoid. No pleural effusion is seen. 6. Cardiomegaly with mildly distended pulmonary veins. 7. 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 Follow-up as indicated. Electronically Signed   By: Telford Nab M.D.   On: 07/18/2021 05:42   DG Chest Portable 1 View  Result Date: 07/18/2021 CLINICAL DATA:  ETT adjustment EXAM: PORTABLE CHEST 1 VIEW COMPARISON:  Earlier same day FINDINGS: 0555 hours. Endotracheal tube tip has been repositioned 3.5 cm above the base of the carina. The NG tube passes into the stomach although the distal tip position is not included on the film. The cardio pericardial silhouette is enlarged. Substantial fullness in the right hilar region compatible with mediastinal and hilar lymphadenopathy seen on recent chest CT. The visualized bony structures of the thorax show no acute abnormality. Telemetry leads overlie the chest. IMPRESSION: 1. Endotracheal tube tip has been repositioned, now 3.5 cm above the base of the carina. Electronically Signed   By: Misty Stanley M.D.   On: 07/18/2021 06:22   DG Chest Portable 1 View  Result Date: 07/18/2021 CLINICAL DATA:  Intubation EXAM: PORTABLE CHEST 1 VIEW COMPARISON:  03/18/2019 FINDINGS: The tip of the endotracheal tube is just within the right mainstem bronchus. Mild bilateral parahilar opacities. Normal cardiomediastinal contours. IMPRESSION: Endotracheal tube tip just within the right mainstem bronchus. Retraction by approximately 3 cm should place the tip at the level of the clavicular heads. These results will be called to the ordering clinician or representative by the Radiologist Assistant, and communication documented in the PACS or [employer]. Electronically Signed   By: Ulyses Jarred M.D.   On: 07/18/2021 03:01   [place] EKG SITE RITE  Result Date: 07/18/2021 If Site Rite image not attached, placement could not be confirmed due to current  cardiac rhythm.   Procedures CPR  Date/Time: 07/18/2021 3:41 AM Performed by: Ezequiel Essex, MD Authorized by: Ezequiel Essex, MD  CPR Procedure Details:      Amount of time prior to administration of ACLS/BLS (minutes):  0   ACLS/BLS initiated by EMS: No     CPR/ACLS performed in the ED: Yes     Duration of CPR (minutes):  5   Outcome: ROSC obtained    CPR performed via ACLS guidelines under my direct supervision.  See RN documentation for details including defibrillator use, medications, doses and timing. Comments:     Initial rhythm was ventricular fibrillation.  Patient defibrillated with 1 shock and CPR initiated.  ACLS protocol continued and epinephrine given.  ROSC achieved after approximately 3 minutes. .Critical Care Performed by: Ezequiel Essex, MD Authorized by: Ezequiel Essex, MD   Critical care provider statement:    Critical care time (minutes):  150   Critical care time was exclusive of:  Separately billable procedures and treating other patients   Critical care was necessary to treat or prevent imminent or life-threatening deterioration of the following conditions:  Cardiac failure, respiratory failure, shock and dehydration   Critical care was time spent personally by me on the following activities:  Development of treatment plan with patient or surrogate, discussions with consultants, evaluation of patient's response to treatment, examination of patient, ordering and review of laboratory studies, ordering and review of radiographic studies, ordering and performing treatments and interventions, pulse oximetry, re-evaluation of patient's condition and review of old charts   I assumed direction of critical care for this patient from another provider in my specialty: no  Care discussed with: admitting provider   Procedure Name: Intubation Date/Time: 07/18/2021 3:42 AM Performed by: Ezequiel Essex, MD Pre-anesthesia Checklist: Patient identified, Patient  being monitored, Emergency Drugs available, Timeout performed and Suction available Oxygen Delivery Method: Non-rebreather mask Preoxygenation: Pre-oxygenation with 100% oxygen Induction Type: Rapid sequence Ventilation: Mask ventilation without difficulty Laryngoscope Size: Glidescope and 4 Grade View: Grade II Tube size: 7.5 mm Number of attempts: 1 Airway Equipment and Method: Stylet, Video-laryngoscopy and Patient positioned with wedge pillow Placement Confirmation: ETT inserted through vocal cords under direct vision, CO2 detector and Breath sounds checked- equal and bilateral Secured at: 24 cm Tube secured with: ETT holder Dental Injury: Teeth and Oropharynx as per pre-operative assessment  Difficulty Due To: Difficult Airway- due to reduced neck mobility, Difficult Airway- due to anterior larynx and Difficult Airway- due to limited oral opening Future Recommendations: Recommend- induction with short-acting agent, and alternative techniques readily available    .Cardioversion  Date/Time: 07/18/2021 5:02 AM Performed by: Ezequiel Essex, MD Authorized by: Ezequiel Essex, MD   Consent:    Consent obtained:  Emergent situation   Alternatives discussed:  No treatment Pre-procedure details:    Cardioversion basis:  Emergent   Rhythm:  Ventricular tachycardia   Electrode placement:  Anterior-posterior Patient sedated: No Attempt one:    Cardioversion mode:  Asynchronous   Waveform:  Biphasic   Shock (Joules):  150   Cardioversion outcome attempt one: conversion to atrial fibrillation. Post-procedure details:    Patient status:  Unresponsive   Patient tolerance of procedure:  Tolerated well, no immediate complications   Medications Ordered in ED Medications  potassium chloride 10 mEq in 100 mL IVPB (10 mEq Intravenous New Bag/Given 07/18/21 0309)  sodium chloride 0.9 % bolus 1,000 mL (1,000 mLs Intravenous Not Given 07/18/21 0313)  norepinephrine (LEVOPHED) 4-5  MG/250ML-% infusion SOLN (has no administration in time range)  fentaNYL 2535mcg in NS 26mL (49mcg/ml) infusion-PREMIX (25 mcg/hr Intravenous New Bag/Given 07/18/21 0247)  midazolam (VERSED) injection 2 mg (2 mg Intravenous Given 07/18/21 0253)  midazolam (VERSED) injection 2 mg (has no administration in time range)  fentaNYL (SUBLIMAZE) bolus via infusion 25 mcg (has no administration in time range)  midazolam (VERSED) 2 MG/2ML injection (has no administration in time range)  EPINEPHrine (ADRENALIN) 1 MG/10ML injection (1 mg Intravenous Given 07/18/21 0214)  sodium bicarbonate injection (50 mEq Intravenous Given 07/18/21 0214)  etomidate (AMIDATE) injection (20 mg Intravenous Given 07/18/21 0220)  succinylcholine (ANECTINE) injection (100 mg Intravenous Given 07/18/21 0223)  0.9 %  sodium chloride infusion (1,000 mLs Intravenous New Bag/Given 07/18/21 0221)  magnesium sulfate IVPB 2 g 50 mL (0 g Intravenous Stopped 07/18/21 0318)  fentaNYL (SUBLIMAZE) injection 50 mcg (50 mcg Intravenous Given 07/18/21 0249)    ED Course  I have reviewed the triage vital signs and the nursing notes.  Pertinent labs & imaging results that were available during my care of the patient were reviewed by me and considered in my medical decision making (see chart for details).    MDM Rules/Calculators/A&P                          Patient brought back from waiting room unresponsive.  She did have a pulse but was not breathing on her own.  Assisted ventilations performed.  Found to be in ventricular fibrillation.  She was shocked x1 and CPR was initiated with ACLS protocol.  She was given epinephrine and bicarb.  Spontaneous circulation returned after  approximately 3 minutes of CPR and 1 round of medications.  Found to be in atrial fibrillation multifocal atrial tachycardia without ST segment elevation.  Initiated on amiodarone bolus and drip. Hypokalemia noted and is replaced. Initial EKG in triage showed  prolonged QT of 599.  Discussed with cardiology Dr. Humphrey Rolls who is seeing patient.  He agrees no indication for emergent catheterization at this time.  Agrees electrolyte management and amiodarone. Ok with amiodarone with initial QT of 599.  Family updated at bedside. Patient to remain a full code at this time.  Admission discussed with ICU team and Dr. Carson Myrtle.   While awaiting admission to ICU, patient went back into ventricular tachycardia without a pulse.  She was defibrillated with 150 J to atrial fibrillation with pulse restored. Dr. Carson Myrtle at bedside.  CT head stable.  CT PE shows no pulmonary embolism, right main stem bronchus intubation was corrected.  Left basilar consolidation likely secondary to that.  Continues to have intermittent episodes of ventricular fibrillation in the ED.  Few episodes spontaneously resolve, has also required 2 additional shocks for persistent VF.  Repeat electrolytes are pending.  Additional doses of potassium magnesium given. Continue amiodarone.  Cardiology and critical care updated. Final Clinical Impression(s) / ED Diagnoses Final diagnoses:  Cardiac arrest [facility])  Ventricular fibrillation ([place])  Hypokalemia    Rx / DC Orders ED Discharge Orders     None        Sharaine Delange, Annie Main, MD 07/18/21 [phone]

## 2021-07-18 NOTE — ED Notes (Signed)
CCM providers at bedside 

## 2021-07-18 NOTE — Progress Notes (Signed)
  Echocardiogram 2D Echocardiogram has been performed.  Robin Estes F 07/18/2021, 10:31 AM

## 2021-07-18 NOTE — Consult Note (Signed)
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 Tamala Julian (CCM) due to VF arrest and possible cardiac sarcoid    She has a h/o biopsy-proven sarcoidosis (eye, lung, and skin), MAT, HTN, MR, pulmonary nodules and  systolic heart failure.   In July 2020 she was admitted to [facility] with increased shortness breath. EKG showed A fib/MAT. ECHO  EF 30-35%  and mod-severe MR.GDMT initiated   Due to concern for possible cardiac sarcoid a cMRI was obtained in 8/20  cMRI 04/24/19 1.  Hilar lymphadenopathy consistent with sarcoidosis diagnosis. 2. Mildly dilated LV with EF 45%, there was mild diffuse hypokinesis with severe mid inferior hypokinesis. 3.  Normal RV size and systolic function, EF 51%. 4. Mitral regurgitation appeared severe (flow sequences to quantify were not done). 5. Possible subtle mid-wall LGE in the mid inferior wall (not seen on long axis views). This could be consistent with cardiac sarcoidosis. This is not consistent with prior MI.   TEE 8/21 LVEF 40-45% w/ mild global HK with moderate to severe functional central MR.Marland Kitchen Referred to Dr. Burt Knack for consideration of [facility] but did not keep the appointment d/t gap in insurance coverage.  I saw her in 8/22 and was doing well on GDMT with stable NYHA II symptoms. Sarcoid thought to be quiescent. Repeat echo ordered   On 11/17, pt had an episode of syncope at home around 6 pm. She presented to [facility], was in the triage/waiting room for a few hours where she had an event of cardiac arrest/Vfib (CPR time approx 5 minutes; 1 dose of epi; she was successfully shocked out of it). Post ROSC she had MAT/Afib on the EKG with no signs of ST elevation, and soft Bps. Levophed was started and Cardiology was consulted. Her initial labs showed  K=2.7; and her troponin in the waiting area was 16. Intubated and sedated to protect airway with recurrent arrhythmias. Started on amio and lidocaine. Electrolytes repleted    Pertinent labs on admission include WBC 4.3, Hgb 9.4, K 2.8, Cr 0.83, Mg 1.6    Intubate/sedated    Review of Systems: not available due to intubation    Home Medications Prior to Admission medications   Medication Sig Start Date End Date Taking? Authorizing Provider  atorvastatin (LIPITOR) 20 MG tablet TAKE 1 TABLET BY MOUTH ONCE DAILY AT 6 P.M. Patient taking differently: Take 20 mg by mouth daily. 09/27/20   Sueanne Margarita, MD  dapagliflozin propanediol (FARXIGA) 10 MG TABS tablet Take 1 tablet (10 mg total) by mouth daily before breakfast. 04/18/21   Richards Pherigo, Shaune Pascal, MD  furosemide (LASIX) 20 MG tablet Take 1 tablet (20 mg total) by mouth as needed for fluid. 05/06/21   Christophor Eick, Shaune Pascal, MD  guaiFENesin (MUCINEX) 600 MG 12 hr tablet Take 600 mg by mouth daily.    [provider]  metoprolol succinate (TOPROL-XL) 50 MG 24 hr tablet TAKE 1 TABLET BY MOUTH ONCE DAILY. TAKE  WITH  OR  IMMEDIATELY  FOLLOWING  A  MEAL. Patient taking differently: 50 mg daily. 06/13/21   Sueanne Margarita, MD  Multiple Vitamins-Minerals (MULTIVITAMIN WITH MINERALS) tablet Take 1 tablet by mouth daily.    [provider]  potassium chloride SA (KLOR-CON) 20 MEQ  tablet TAKE 1 TABLET BY MOUTH ONCE DAILY . APPOINTMENT REQUIRED FOR FUTURE REFILLS Patient taking differently: 20 mEq daily. 03/19/21   Amily Depp, Shaune Pascal, MD  sacubitril-valsartan (ENTRESTO) 97-103 MG Take 1 tablet by mouth 2 (two) times daily. Needs appt for further refills Patient taking differently: Take 1 tablet by mouth 2 (two) times daily. 12/31/20   Benedicto Capozzi, Shaune Pascal, MD  spironolactone (ALDACTONE) 25 MG tablet Take 1 tablet (25 mg total) by mouth daily. 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[place])    Dysuria    Elevated troponin 03/15/2019   End-stage glaucoma    RIGHT EYE   Frequency of urination    Legally blind in right eye, as defined in [place]    SECONDARY TO GLAUCOMA   Lymphadenopathy, hilar 03/15/2019   Multifocal atrial tachycardia (HCC)    Nocturia    Pleural effusion 03/15/2019   Prolonged QT interval 03/15/2019   Pulmonary sarcoidosis ([place]) 07/09/2019   Sarcoidosis 03/15/2019   Sarcoidosis of skin    Sensation of pressure in bladder area    Solitary pulmonary nodule 07/09/2019   Urgency of urination     Past Surgical History: Past Surgical History:  Procedure Laterality Date   CATARACT EXTRACTION W/ INTRAOCULAR LENS IMPLANT Left    CYSTOSCOPY WITH HYDRODISTENSION AND BIOPSY N/A 04/21/2013   Procedure: CYSTOSCOPY/BLADDER BIOPSY/HYDRODISTENSION;  Surgeon: Ailene Rud, MD;  Location: [facility];  Service: Urology;  Laterality: N/A;   TEE WITHOUT CARDIOVERSION N/A 04/08/2020   Procedure: TRANSESOPHAGEAL ECHOCARDIOGRAM (TEE);  Surgeon: Jolaine Artist, MD;  Location: [facility] ENDOSCOPY;  Service: Cardiovascular;  Laterality: N/A;    Family History: Family History  Problem Relation Age of Onset   CAD Father    Heart attack Father    Sarcoidosis Other    Healthy Sister    Hyperlipidemia Sister    Healthy Sister     Social History: Social History   Socioeconomic History   Marital status: Married    Spouse name: Not on file   Number of children: Not on file   Years of education: Not on file   Highest education level: Not on file  Occupational History   Not on file  Tobacco Use   Smoking status: Never   Smokeless tobacco: Never  Substance and Sexual Activity   Alcohol use: No   Drug use: No   Sexual activity: Not on file  Other Topics Concern   Not on file  Social  History Narrative   Not on file   Social Determinants of Health   Financial Resource Strain: High Risk   Difficulty of Paying Living Expenses: Hard  Food Insecurity: No Food Insecurity   Worried About Running Out of Food in the Last Year: Never true   [place] in the Last Year: Never true  Transportation Needs: No Transportation Needs   Lack of Transportation (Medical): No   Lack of Transportation (Non-Medical): No  Physical Activity: Not on file  Stress: Not on file  Social Connections: Not on file    Allergies:  Allergies  Allergen Reactions   Cinnamon Other (See Comments)    MOUTH ULCERS    Objective:    Vital Signs:   Temp:  [97.4  F (36.3 C)-97.7 F (36.5 C)] 97.7 F (36.5 C) (11/18 0000) Pulse Rate:  [30-93] 55 (11/18 1015) Resp:  [12-37] 21 (11/18 1015) BP: (64-159)/(54-97) 117/73 (11/18 1015) SpO2:  [67 %-100 %] 100 % (11/18 1108) FiO2 (%):  [30 %-100 %] 50 % (11/18 1108) Weight:  [63 kg] 63 kg (11/18 1139)    Weight change: Filed Weights   07/18/21 1139  Weight: 63 kg    Intake/Output:   Intake/Output Summary (Last 24 hours) at 07/18/2021 1148 Last data filed at 07/18/2021 1108 Gross per 24 hour  Intake 386.37 ml  Output 200 ml  Net 186.37 ml      Physical Exam    General:  Intubated/seated HEENT: normal + ETT Neck: supple. JVP 6-7. Carotids 2+ bilat; no bruits. No lymphadenopathy or thyromegaly appreciated. Cor: PMI nondisplaced. Regular rate & rhythm. No rubs, gallops or murmurs. Lungs: clear Abdomen: soft, nontender, nondistended. No hepatosplenomegaly. No bruits or masses. Good bowel sounds. Extremities: no cyanosis, clubbing, rash, edema Neuro: alert & orientedx3, cranial nerves grossly intact. moves all 4 extremities w/o difficulty. Affect pleasant   Telemetry   NSR 60s Personally reviewed   EKG    NSR no ischemic changes  Labs   Basic Metabolic Panel: Recent Labs  Lab 07/17/21 2216 07/17/21 2227 07/18/21 0229  07/18/21 0248 07/18/21 0429 07/18/21 0450 07/18/21 0623 07/18/21 0652 07/18/21 1114  NA 141 143 140 143  --  140 139 141 137  K 2.8* 2.7* 3.1* 3.2*  --  3.1* 3.3* 3.3* 3.4*  CL 103 102 104 103  --   --   --  104  --   CO2 27  --  21*  --   --   --   --  21*  --   GLUCOSE 113* 114* 133* 121*  --   --   --  172*  --   BUN <5* 3* 6* 5*  --   --   --  6*  --   CREATININE 0.83 0.80 0.93 0.70 0.92  --   --  0.90  --   CALCIUM 8.2*  --  7.9*  --   --   --   --  8.4*  --   MG  --   --  1.6*  --   --   --   --  2.3  --     Liver Function Tests: Recent Labs  Lab 07/17/21 2216 07/18/21 0229  AST 41 56*  ALT 21 25  ALKPHOS 123 127*  BILITOT 1.3* 1.4*  PROT 6.3* 5.8*  ALBUMIN 2.9* 2.7*   No results for input(s): LIPASE, AMYLASE in the last 168 hours. No results for input(s): AMMONIA in the last 168 hours.  CBC: Recent Labs  Lab 07/17/21 2216 07/17/21 2227 07/18/21 0229 07/18/21 0248 07/18/21 0450 07/18/21 0623 07/18/21 1114  WBC 4.3  --  7.6  --   --   --   --   NEUTROABS 3.2  --  4.9  --   --   --   --   HGB 9.4*   < > 9.7* 11.6* 8.5* 8.8* 10.2*  HCT 30.4*   < > 31.0* 34.0* 25.0* 26.0* 30.0*  MCV 94.7  --  96.0  --   --   --   --   PLT 320  --  376  --   --   --   --    < > = values in this interval not displayed.  Cardiac Enzymes: No results for input(s): CKTOTAL, CKMB, CKMBINDEX, TROPONINI in the last 168 hours.  BNP: BNP (last 3 results) Recent Labs    04/18/21 1340 07/18/21 0230  BNP 124.9* 893.2*    ProBNP (last 3 results) No results for input(s): PROBNP in the last 8760 hours.   CBG: Recent Labs  Lab 07/17/21 2153 07/18/21 0218  GLUCAP 109* 94    Coagulation Studies: No results for input(s): LABPROT, INR in the last 72 hours.   Imaging   CT Head Wo Contrast  Result Date: 07/18/2021 CLINICAL DATA:  Altered mental status. EXAM: CT HEAD WITHOUT CONTRAST TECHNIQUE: Contiguous axial images were obtained from the base of the skull through the  vertex without intravenous contrast. COMPARISON:  None. FINDINGS: Brain: There is mild atrophy, small vessel disease and atrophic ventriculomegaly. There is no midline shift. No asymmetry is seen concerning for an acute infarct, hemorrhage or mass. There is mild streak artifact on some of the lower slices due to overlying material. Basal cisterns are clear. Vascular: There are patchy calcifications in the carotid siphons, distal vertebral arteries. There are no hyperdense central vessels. Skull: Normal. Negative for fracture or focal lesion. Sinuses/Orbits: Visualized orbital contents are unremarkable. There is patchy membrane thickening in the ethmoid air cells. Other visualized sinuses and bilateral mastoid air cells are clear with maxillary sinuses not included. Other: The patient is intubated. IMPRESSION: No acute intracranial CT findings. Chronic change. Vascular calcifications. Electronically Signed   By: Telford Nab M.D.   On: 07/18/2021 05:15   CT Angio Chest PE W and/or Wo Contrast  Result Date: 07/18/2021 CLINICAL DATA:  Cardiac arrest, VFib, high probability of pulmonary embolism. History of sarcoidosis according to the history from the prior chest CT with contrast. EXAM: CT ANGIOGRAPHY CHEST WITH CONTRAST TECHNIQUE: Multidetector CT imaging of the chest was performed using the standard protocol during bolus administration of intravenous contrast. Multiplanar CT image reconstructions and MIPs were obtained to evaluate the vascular anatomy. CONTRAST:  38m OMNIPAQUE IOHEXOL 350 MG/ML SOLN COMPARISON:  Portable chest from earlier today and CT chest with contrast 07/26/2019 FINDINGS: Cardiovascular: The pulmonary arteries are normal in caliber and well opacified through the segmental divisions. No embolus is seen. Subsegmental arteries are not well opacified. The thoracic aorta is normal in caliber with mild patchy calcific plaques in the arch and descending portions common descending segment  tortuosity. Aortic enhancement is insufficient to assess its lumen. Central pulmonary veins are mildly distended but no more than previously. There is mild cardiomegaly with slight reflux into the IVC and panchamber involvement, small increased pericardial effusion compared to the prior study. Mediastinum/Nodes: Right mainstem bronchus intubation. This was seen on today's chest x-ray. The tube should be withdrawn 5 cm for optimal placement. Extensive mediastinal and hilar adenopathy is similar to the prior study and some lymph nodes as before containing calcifications consistent with granulomatous disease process. This was seen previously and there has been no worsening in the adenopathy but no improvement either. 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 No supraclavicular or axillary adenopathy is seen. Lungs/Pleura: There are multiple bilateral irregular pulmonary nodules, most numerous in the right upper lobe and most of them are in a peribronchovascular distribution which is commonly seen with sarcoid disease. No new or enlarging nodules are observed. Individual right upper lobe nodules measure up to 1.1 cm in size. Other scattered nodules in the remaining lungs are smaller.  There are increased posterior pleural-parenchymal opacities in the right lower lobe which are probably due to atelectasis, and increased dense consolidation in the left lower lobe, potentially related to the right main bronchus intubation or potentially due to pneumonia. There is increased subpleural septal thickening in the apices and bases which could be progression of interstitial lung disease or interstitial edema. Similarly there is increased perihilar ground-glass haziness in the upper lobes which could be due to interstitial disease or edema. Upper Abdomen: No acute abnormality. Musculoskeletal: No chest wall abnormality. No acute or significant osseous findings. Review of  the MIP images confirms the above findings. IMPRESSION: 1. No arterial embolism is seen through the segmental arteries. The subsegmental arteries are not well seen. 2. Right main bronchus intubation. Withdraw tube 5 cm for optimal position. 3. Dense consolidation with air bronchograms in the left lower lobe which could be due to the right main bronchus intubation or consolidation of other etiology. 4. No change in extensive mediastinal and hilar adenopathy and lung nodules presumably sarcoid related. 5. Interstitial changes in the lung apices and bases which could be due to edema or progression of interstitial lung disease related to sarcoid. No pleural effusion is seen. 6. Cardiomegaly with mildly distended pulmonary veins. 7. 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 Follow-up as indicated. Electronically Signed   By: Telford Nab M.D.   On: 07/18/2021 05:42   DG Chest Portable 1 View  Result Date: 07/18/2021 CLINICAL DATA:  ETT adjustment EXAM: PORTABLE CHEST 1 VIEW COMPARISON:  Earlier same day FINDINGS: 0555 hours. Endotracheal tube tip has been repositioned 3.5 cm above the base of the carina. The NG tube passes into the stomach although the distal tip position is not included on the film. The cardio pericardial silhouette is enlarged. Substantial fullness in the right hilar region compatible with mediastinal and hilar lymphadenopathy seen on recent chest CT. The visualized bony structures of the thorax show no acute abnormality. Telemetry leads overlie the chest. IMPRESSION: 1. Endotracheal tube tip has been repositioned, now 3.5 cm above the base of the carina. Electronically Signed   By: Misty Stanley M.D.   On: 07/18/2021 06:22   DG Chest Portable 1 View  Result Date: 07/18/2021 CLINICAL DATA:  Intubation EXAM: PORTABLE CHEST 1 VIEW COMPARISON:  03/18/2019 FINDINGS: The tip of the endotracheal tube is just within the right mainstem bronchus. Mild bilateral parahilar opacities. Normal  cardiomediastinal contours. IMPRESSION: Endotracheal tube tip just within the right mainstem bronchus. Retraction by approximately 3 cm should place the tip at the level of the clavicular heads. These results will be called to the ordering clinician or representative by the Radiologist Assistant, and communication documented in the PACS or [employer]. Electronically Signed   By: Ulyses Jarred M.D.   On: 07/18/2021 03:01   [place] EKG SITE RITE  Result Date: 07/18/2021 If Site Rite image not attached, placement could not be confirmed due to current cardiac rhythm.    Medications:     Current Medications:  chlorhexidine gluconate (MEDLINE KIT)  15 mL Mouth Rinse BID   Chlorhexidine Gluconate Cloth  6 each Topical Daily   heparin  5,000 Units Subcutaneous Q8H   mouth rinse  15 mL Mouth Rinse 10 times per day   [START ON 07/19/2021] methylPREDNISolone (SOLU-MEDROL) injection  60 mg Intravenous Daily    Infusions:  amiodarone 60 mg/hr (07/18/21 1105)   amiodarone     famotidine (PEPCID) IV     fentaNYL infusion INTRAVENOUS 250 mcg/hr (  07/18/21 1107)   lactated ringers 50 mL/hr at 07/18/21 1106   lidocaine 2 mg/min (07/18/21 1107)   norepinephrine (LEVOPHED) Adult infusion 1 mcg/min (07/18/21 1106)   sodium chloride        Assessment/Plan   1. VT/VF arrest on 11/17 - in setting of presumed NICM and severe electrolyte abnormalities - concern for cardiac sarcoidosis as well - rhythm now stable on amio and lidocaine - Keep K > 4.0 and Mg > 2.0 - Will need cath to make sure no high-grade ischemia - Once extubated will need repeat cMRI and likely outpatient PET to better assess for cardiac sarcoid - Agree with empiric steroids  2. Chronic Systolic Heart Failure - ECHO 03/2019 EF 30-35% . Myoview - no ischemia. 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 RV normal. Minimal LGE -> doubt significant cardiac sarcoid - Echo 12/20 EF ~45% MV appears thickened and possible  rheumatic with 3+ MR. Severe LAE. Mod TR.  - TEE 8/21: LVEF 40-45% w/ mild global HK andmoderate to severe functional central MR. - At baseline NYHA class II symptoms - Volume status ok.  - Holding GDMT with arrest - Restart spiro - Plan as above  3. Acute hypoxic respiratory failure in setting of #1 - wean vent as tolerated - CCM managing   4. MAT - Zio in 9/20       1. Sinus rhythm - avg HR of 82      2. 22 Supraventricular Tachycardia runs occurred, the run with the fastest interval lasting 5 beats with a max rate of 184 bpm, the longest lasting 10.9 secs with an avg rate of 162 bpm.     3. Isolated PACs were occasional (4.7%, 18936)     4. Rare PVCs (<1.0%)   5. 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 - She had been referred for possible [employer]. Did not keep appointment last fall due to gap in insurance coverage - Repeat echo   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 Loanne Drilling in Pulmonary. Sarcoid not felt to be active previously - Plan as above. Agree with empiric steroids - Plan cMRI and PET  7. Hypokalemia/hypomagnesemia - Supp as above    CRITICAL CARE Performed by: Glori Bickers  Total critical care time: 45 minutes  Critical care time was exclusive of separately billable procedures and treating other patients.  Critical care was necessary to treat or prevent imminent or life-threatening deterioration.  Critical care was time spent personally by me (independent of midlevel providers or residents) on the following activities: development of treatment plan with patient and/or surrogate as well as nursing, discussions with consultants, evaluation of patient's response to treatment, examination of patient, obtaining history from patient or surrogate, ordering and performing treatments and interventions, ordering and review of laboratory studies, ordering and review of radiographic studies, pulse  oximetry and re-evaluation of patient's condition.   Length of Stay: 0  Glori Bickers, MD  07/18/2021, 11:48 AM  Advanced Heart Failure Team Pager [phone] (M-F; 7a - 5p)  Please contact [facility] for night-coverage after hours (4p -7a ) and weekends on [email or web address]

## 2021-07-18 NOTE — ED Notes (Signed)
Electrophysiology PA at bedside

## 2021-07-18 NOTE — ED Notes (Signed)
Patient shocked for Vfib ( 3rd time) , converted and pulses present. EDP notified.

## 2021-07-18 NOTE — Progress Notes (Signed)
Post ABG results, minute ventilation/RR changed. Joneen Roach, NP made aware of adjustment and ABG values

## 2021-07-18 NOTE — Progress Notes (Signed)
Initial Nutrition Assessment  DOCUMENTATION CODES:   Non-severe (moderate) malnutrition in context of chronic illness  INTERVENTION:   Tube Feeding via OG:  Vital AF 1.2 at 55 ml/hr Provides 1584 kcals, 99 g of protein and 1069 mL of free water  NUTRITION DIAGNOSIS:   Moderate Malnutrition related to chronic illness as evidenced by mild fat depletion, mild muscle depletion.  GOAL:   Patient will meet greater than or equal to 90% of their needs  MONITOR:   TF tolerance, Vent status, Labs, Weight trends  REASON FOR ASSESSMENT:   Consult, Ventilator    ASSESSMENT:   75 yo female admitted post VF arrest secondary to electrolyte abnormalities, possible cardiac sarcoidosis, intubated. PMH includes sarcoidosis (pulmonary, eye, skin), HTN, NICM with LVEF 40-45%  11/18 VF arrest, Intubated  OG tube passes into stomach  UBW between 135-137 pounds; weighs herself weekly  Appetite has not been good per daughter; pt literally gives food away to other people, she makes care packages and gives her food away. Daughter also indicates pt has mild dementia Daughter reports pt minimally active at baseline. Explains the significant LE wasting compared to other areas of body  Labs: potassium 3.3 (L) Meds: solumedrol, miralax   NUTRITION - FOCUSED PHYSICAL EXAM:  Flowsheet Row Most Recent Value  Orbital Region Mild depletion  Upper Arm Region No depletion  Thoracic and Lumbar Region Mild depletion  Buccal Region Unable to assess  Temple Region Moderate depletion  Clavicle Bone Region Mild depletion  Clavicle and Acromion Bone Region Mild depletion  Dorsal Hand Unable to assess  Patellar Region Severe depletion  Anterior Thigh Region Severe depletion  Posterior Calf Region Severe depletion  Edema (RD Assessment) Mild       Diet Order:   Diet Order     None       EDUCATION NEEDS:   Not appropriate for education at this time  Skin:  Skin Assessment: Reviewed RN  Assessment  Last BM:  no documented BM  Height:   Ht Readings from Last 1 Encounters:  07/18/21 4\' 11"  (1.499 m)    Weight:   Wt Readings from Last 1 Encounters:  07/18/21 63 kg     BMI:  Body mass index is 28.05 kg/m.  Estimated Nutritional Needs:   Kcal:  1500-1700 kcals  Protein:  90-115 g  Fluid:  >/= 1.5 L  07/20/21 MS, RDN, LDN, CNSC Registered Dietitian III Clinical Nutrition RD Pager and On-Call Pager Number Located in [place]

## 2021-07-18 NOTE — Progress Notes (Signed)
Peripherally Inserted Central Catheter Placement  The IV Nurse has discussed with the patient and/or persons authorized to consent for the patient, the purpose of this procedure and the potential benefits and risks involved with this procedure.  The benefits include less needle sticks, lab draws from the catheter, and the patient may be discharged home with the catheter. Risks include, but not limited to, infection, bleeding, blood clot (thrombus formation), and puncture of an artery; nerve damage and irregular heartbeat and possibility to perform a PICC exchange if needed/ordered by physician.  Alternatives to this procedure were also discussed.  Bard Power PICC patient education guide, fact sheet on infection prevention and patient information card has been provided to patient /or left at bedside.    PICC Placement Documentation  PICC Double Lumen 07/18/21 PICC Right Brachial 37 cm 0 cm (Active)  Indication for Insertion or Continuance of Line Vasoactive infusions 07/18/21 1200  Exposed Catheter (cm) 0 cm 07/18/21 1200  Site Assessment Clean;Dry;Intact 07/18/21 1200  Lumen #1 Status Flushed;Blood return noted 07/18/21 1200  Lumen #2 Status Flushed;Blood return noted 07/18/21 1200  Dressing Type Transparent 07/18/21 1200  Dressing Status Clean;Dry;Intact 07/18/21 1200  Antimicrobial disc in place? Yes 07/18/21 1200  Dressing Change Due 07/25/21 07/18/21 1200       Stacie Glaze Horton 07/18/2021, 12:24 PM

## 2021-07-18 NOTE — Progress Notes (Addendum)
Seen and examined. Admitted w/ recurrent VF arrest in setting of advanced cardiomyopathy question sarcoid related On amio, lidocaine being started Exam fairly benign, chronic skin changes, ext warm, lungs clear, sinus on monitor ABG alkalemic CHF team and EP to see. Replete electrolytes. Vent support, reduce mandatory ventilation, sedation titrated to RASS 0 (was waking up uncomfortable requiring boluses in ER)  Will check on throughout day Additional 30 min cc time Erskine Emery MD PCCM    NAME:  Robin Estes, MRN:  [id number], DOB:  09/06/1945, LOS: 0 ADMISSION DATE:  07/17/2021, CONSULTATION DATE:  11/18 REFERRING MD:  Dr. Wyvonnia Dusky, CHIEF COMPLAINT:  VF arrest   History of Present Illness:  75 year old female with PMH as below, which is significant for sarcoidosis (pulmonary, eye, skin) biopsy proven, MAT, HTN, and NICM with LVEF 40-45%. Not felt to have significant cardiac sarcoid involvement at last OV. Presented to [facility] ED 11/18 after suffering a syncopal event at home. While in the waiting room she unfortunately suffered a VF cardiac arrest. Arrest was relatively brief (approximately 5 mins of CPR) until she was ultimately defibrillated into ROSC. Laboratory evaluation significant for K 2.7 and mag 1.6. Evaluated by cardiology in the ED who felt the electrolyte abnormalities were the likely etiology of the arrest. The patient was of course intubated as a result of the cardiac arrest, and PCCM was asked to admit. Post arrest she has been making purposeful movement per ED staff.   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   Significant Hospital Events: Including procedures, antibiotic start and stop dates in addition to other pertinent events   11/18 admit s/p VF arrest.  With multiple episodes of V. fib. 07/18/2021 multiple episodes of V. fib arrest now with lidocaine plus amiodarone infusion    Objective   Blood pressure 139/88, pulse (!) 55, temperature 97.7 F (36.5 C), resp. rate 16, height 5\' 2"  (1.575 m), SpO2 100 %.    Vent Mode: PRVC FiO2 (%):  [40 %-100 %] 40 % Set Rate:  [16 bmp-24 bmp] 16 bmp Vt Set:  [400 mL-460 mL] 460 mL PEEP:  [5 cmH20] 5 cmH20  No intake or output data in the 24 hours ending 07/18/21 0742 There were no vitals filed for this visit.  Examination: General: Elderly female who is heavily sedated with fentanyl HEENT: Endotracheal tube and gastric tube are in place.  Position is confirmed by x-ray 07/18/2021 0 600 Neuro: Does not follow commands at this time CV: Bradycardic 56 PULM:   Vent pressure regulated volume control FIO2 40% PEEP 5 RATE 16 VT 460 Arterial blood gas 07/19/2019 7 AM 7 5 4/28/182 or respiratory rate of 16  GI: soft, bsx4 active  GU: Amber urine Extremities: warm/dry, 1+ edema  Skin: n left scalp indications of sarcoid   Resolved Hospital Problem list     Assessment & Plan:   VF cardiac arrest: felt to be secondary to hypokalemia, hypomagnesemia, hypocalcemia. Known NICM and risk of cardiac sarcoid. Has had additional episodes of  VF in ED which have so far responded quickly to defibrillation.   Awaits a bed in ICU Continue amiodarone Continue lidocaine Aggressive repletion of electrolytes Recheck electrolytes at 12 noon  Cardiogenic shock: HFrEF: LVEF  40-45% MAT Vasopressor support goal mean arterial pressure greater than 65 2D echo with pending Holding diuresis 2D echocardiogram Cardiology input greatly appreciated  Sarcoidosis (skin, pulm, eye) possible cardiac involvement as well. Some question of this raised on cardiac MRI in 2020.  Systemic steroid Questionable further imaging and cardiac with MRI/PET 2D echo is pending Cardiology has been consulted    Endotracheal tube present: Continue full mechanical ventilatory suppor Adjustments in rate offset pH 7.3 Sedation protocol with negative RASS of 1 Serial chest x-ray     Best Practice (right click and "Reselect all SmartList Selections" daily)   Diet/type: NPO DVT prophylaxis: LMWH GI prophylaxis: PPI Lines: N/A Foley:  Yes, and it is still needed Code Status:  full code Last date of multidisciplinary goals of care discussion [ 11/18 ] 07/18/2021 family updated by day team.  Labs   CBC: Recent Labs  Lab 07/17/21 2216 07/17/21 2227 07/18/21 0229 07/18/21 0248 07/18/21 0450 07/18/21 0623  WBC 4.3  --  7.6  --   --   --   NEUTROABS 3.2  --  4.9  --   --   --   HGB 9.4* 9.9* 9.7* 11.6* 8.5* 8.8*  HCT 30.4* 29.0* 31.0* 34.0* 25.0* 26.0*  MCV 94.7  --  96.0  --   --   --   PLT 320  --  376  --   --   --     Basic Metabolic Panel: Recent Labs  Lab 07/17/21 2216 07/17/21 2227 07/18/21 0229 07/18/21 0248 07/18/21 0429 07/18/21 0450 07/18/21 0623  NA 141 143 140 143  --  140 139  K 2.8* 2.7* 3.1* 3.2*  --  3.1* 3.3*  CL 103 102 104 103  --   --   --   CO2 27  --  21*  --   --   --   --   GLUCOSE 113* 114* 133* 121*  --   --   --   BUN <5* 3* 6* 5*  --   --   --   CREATININE 0.83 0.80 0.93 0.70 0.92  --   --   CALCIUM 8.2*  --  7.9*  --   --   --   --   MG  --   --  1.6*  --   --   --   --    GFR: CrCl cannot be calculated (Unknown ideal weight.). Recent Labs  Lab 07/17/21 2216 07/18/21 0229 07/18/21 0429  WBC 4.3 7.6  --   LATICACIDVEN  --   5.4* 4.7*    Liver Function Tests: Recent Labs  Lab 07/17/21 2216 07/18/21 0229  AST 41 56*  ALT 21 25  ALKPHOS 123 127*  BILITOT 1.3* 1.4*  PROT 6.3* 5.8*  ALBUMIN 2.9* 2.7*   No results for input(s): LIPASE, AMYLASE in the last 168 hours. No results for input(s): AMMONIA in the last 168 hours.  ABG    Component Value Date/Time   PHART 7.539 (H) 07/18/2021 0623   PCO2ART 28.9 (L) 07/18/2021 0623   PO2ART 182 (H) 07/18/2021 0623   HCO3 24.8 07/18/2021 0623   TCO2 26 07/18/2021 0623   O2SAT 100.0 07/18/2021 0623     Coagulation Profile: No results for input(s): INR, PROTIME  in the last 168 hours.  Cardiac Enzymes: No results for input(s): CKTOTAL, CKMB, CKMBINDEX, TROPONINI in the last 168 hours.  HbA1C: Hgb A1c MFr Bld  Date/Time Value Ref Range Status  03/16/2019 02:25 AM 5.6 4.8 - 5.6 % Final    Comment:    (NOTE) Pre diabetes:          5.7%-6.4% Diabetes:              >6.4% Glycemic control for   <7.0% adults with diabetes     CBG: Recent Labs  Lab 07/17/21 2153 07/18/21 0218  GLUCAP 109* 94     Critical care time: 50 minutes     Brett Canales Minor ACNP Acute Care Nurse Practitioner [facility]/Critical Care Please consult Amion 07/18/2021, 7:42 AM

## 2021-07-18 NOTE — Progress Notes (Signed)
Chaplain paged for family support as patient had coded.  Chaplain arrived and family outside the trauma room speaking with the MD.  Family, son, daughter, daughter in law.  All emotional and worried.  Son is struggling to be present, but wants to be supportive of his mom.  Chaplain offered ways to be bedside and holding patient hand and speaking softly to her  Chaplain provided ministry of presence, prayer and words of comfort and support.  Chaplain remains available as needed. Chaplain Agustin Cree, [place].    07/18/21 0535  Clinical Encounter Type  Visited With Patient and family together;Health care provider  Visit Type ED;Critical Care  Referral From Nurse  Consult/Referral To Chaplain  Spiritual Encounters  Spiritual Needs Emotional;Prayer

## 2021-07-18 NOTE — Progress Notes (Signed)
ETT withdraw 3cm per Renae Fickle, NP.no complications noted

## 2021-07-19 ENCOUNTER — Inpatient Hospital Stay (HOSPITAL_COMMUNITY): Payer: Medicare Other

## 2021-07-19 DIAGNOSIS — I469 Cardiac arrest, cause unspecified: Secondary | ICD-10-CM

## 2021-07-19 DIAGNOSIS — E440 Moderate protein-calorie malnutrition: Secondary | ICD-10-CM | POA: Insufficient documentation

## 2021-07-19 DIAGNOSIS — J9602 Acute respiratory failure with hypercapnia: Secondary | ICD-10-CM

## 2021-07-19 DIAGNOSIS — R570 Cardiogenic shock: Secondary | ICD-10-CM

## 2021-07-19 DIAGNOSIS — J9601 Acute respiratory failure with hypoxia: Secondary | ICD-10-CM

## 2021-07-19 LAB — PHOSPHORUS
Phosphorus: 4.6 mg/dL (ref 2.5–4.6)
Phosphorus: 3.9 mg/dL (ref 2.5–4.6)

## 2021-07-19 LAB — BASIC METABOLIC PANEL WITH GFR
Anion gap: 11 (ref 5–15)
BUN: 12 mg/dL (ref 8–23)
CO2: 19 mmol/L — ABNORMAL LOW (ref 22–32)
Calcium: 7.6 mg/dL — ABNORMAL LOW (ref 8.9–10.3)
Chloride: 94 mmol/L — ABNORMAL LOW (ref 98–111)
Creatinine, Ser: 1.89 mg/dL — ABNORMAL HIGH (ref 0.44–1.00)
GFR, Estimated: 27 mL/min — ABNORMAL LOW
Glucose, Bld: 476 mg/dL — ABNORMAL HIGH (ref 70–99)
Potassium: 4.2 mmol/L (ref 3.5–5.1)
Sodium: 124 mmol/L — ABNORMAL LOW (ref 135–145)

## 2021-07-19 LAB — COOXEMETRY PANEL
Carboxyhemoglobin: 0.4 % — ABNORMAL LOW (ref 0.5–1.5)
Carboxyhemoglobin: 0.6 % (ref 0.5–1.5)
Carboxyhemoglobin: 0.8 % (ref 0.5–1.5)
Methemoglobin: 0.9 % (ref 0.0–1.5)
Methemoglobin: 1 % (ref 0.0–1.5)
Methemoglobin: 0.8 % (ref 0.0–1.5)
O2 Saturation: 34.9 %
O2 Saturation: 42.2 %
O2 Saturation: 59.1 %
Total hemoglobin: 9.9 g/dL — ABNORMAL LOW (ref 12.0–16.0)
Total hemoglobin: 8.7 g/dL — ABNORMAL LOW (ref 12.0–16.0)
Total hemoglobin: 9.6 g/dL — ABNORMAL LOW (ref 12.0–16.0)

## 2021-07-19 LAB — MAGNESIUM
Magnesium: 2.4 mg/dL (ref 1.7–2.4)
Magnesium: 2.4 mg/dL (ref 1.7–2.4)
Magnesium: 2.2 mg/dL (ref 1.7–2.4)

## 2021-07-19 LAB — BASIC METABOLIC PANEL
Anion gap: 11 (ref 5–15)
Anion gap: 10 (ref 5–15)
BUN: 12 mg/dL (ref 8–23)
BUN: 15 mg/dL (ref 8–23)
CO2: 19 mmol/L — ABNORMAL LOW (ref 22–32)
CO2: 19 mmol/L — ABNORMAL LOW (ref 22–32)
Calcium: 7.5 mg/dL — ABNORMAL LOW (ref 8.9–10.3)
Calcium: 7.7 mg/dL — ABNORMAL LOW (ref 8.9–10.3)
Chloride: 96 mmol/L — ABNORMAL LOW (ref 98–111)
Chloride: 95 mmol/L — ABNORMAL LOW (ref 98–111)
Creatinine, Ser: 1.64 mg/dL — ABNORMAL HIGH (ref 0.44–1.00)
Creatinine, Ser: 2.12 mg/dL — ABNORMAL HIGH (ref 0.44–1.00)
GFR, Estimated: 32 mL/min — ABNORMAL LOW (ref >60)
GFR, Estimated: 24 mL/min — ABNORMAL LOW (ref >60)
Glucose, Bld: 445 mg/dL — ABNORMAL HIGH (ref 70–99)
Glucose, Bld: 439 mg/dL — ABNORMAL HIGH (ref 70–99)
Potassium: 5.1 mmol/L (ref 3.5–5.1)
Potassium: 4.1 mmol/L (ref 3.5–5.1)
Sodium: 126 mmol/L — ABNORMAL LOW (ref 135–145)
Sodium: 124 mmol/L — ABNORMAL LOW (ref 135–145)

## 2021-07-19 LAB — CBC WITH DIFFERENTIAL/PLATELET
Abs Immature Granulocytes: 0.06 10*3/uL (ref 0.00–0.07)
Basophils Absolute: 0 10*3/uL (ref 0.0–0.1)
Basophils Relative: 0 %
Eosinophils Absolute: 0 10*3/uL (ref 0.0–0.5)
Eosinophils Relative: 0 %
HCT: 31.4 % — ABNORMAL LOW (ref 36.0–46.0)
Hemoglobin: 9.7 g/dL — ABNORMAL LOW (ref 12.0–15.0)
Immature Granulocytes: 1 %
Lymphocytes Relative: 5 %
Lymphs Abs: 0.5 10*3/uL — ABNORMAL LOW (ref 0.7–4.0)
MCH: 29.3 pg (ref 26.0–34.0)
MCHC: 30.9 g/dL (ref 30.0–36.0)
MCV: 94.9 fL (ref 80.0–100.0)
Monocytes Absolute: 0.5 10*3/uL (ref 0.1–1.0)
Monocytes Relative: 5 %
Neutro Abs: 8.7 10*3/uL — ABNORMAL HIGH (ref 1.7–7.7)
Neutrophils Relative %: 89 %
Platelets: 367 10*3/uL (ref 150–400)
RBC: 3.31 MIL/uL — ABNORMAL LOW (ref 3.87–5.11)
RDW: 15.8 % — ABNORMAL HIGH (ref 11.5–15.5)
WBC: 9.7 10*3/uL (ref 4.0–10.5)
nRBC: 0.4 % — ABNORMAL HIGH (ref 0.0–0.2)

## 2021-07-19 LAB — GLUCOSE, CAPILLARY
Glucose-Capillary: 165 mg/dL — ABNORMAL HIGH (ref 70–99)
Glucose-Capillary: 169 mg/dL — ABNORMAL HIGH (ref 70–99)
Glucose-Capillary: 182 mg/dL — ABNORMAL HIGH (ref 70–99)
Glucose-Capillary: 193 mg/dL — ABNORMAL HIGH (ref 70–99)
Glucose-Capillary: 194 mg/dL — ABNORMAL HIGH (ref 70–99)
Glucose-Capillary: 199 mg/dL — ABNORMAL HIGH (ref 70–99)

## 2021-07-19 LAB — LACTIC ACID, PLASMA: Lactic Acid, Venous: 5.4 mmol/L (ref 0.5–1.9)

## 2021-07-19 IMAGING — CT CT HEAD W/O CM
3 series · 17 of 30 positions shown, 18 images · non-contrast
Comparison: [DATE]

CLINICAL DATA: Possible anoxic brain injury

EXAM:
CT HEAD WITHOUT CONTRAST
TECHNIQUE: Contiguous axial images were obtained from the base of the skull
through the vertex without intravenous contrast.

[Series 3: head without axial · axial · non-contrast · 0.33mm/px · z∈[-289,-184]mm · 7 of 47 slices shown]
[im 6/47  brain]
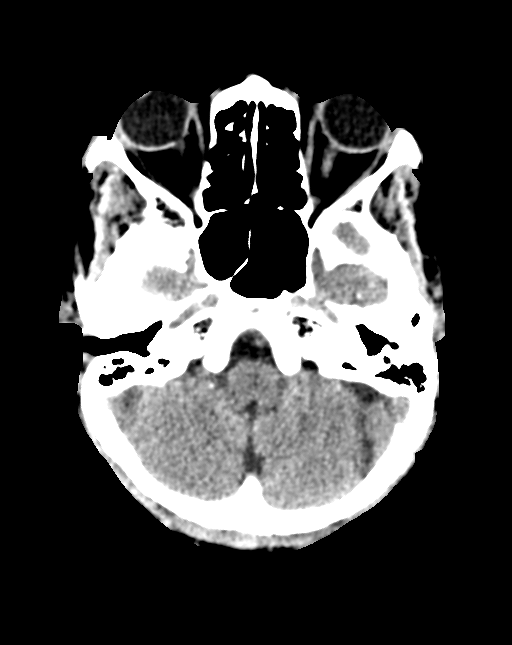
[im 12/47  brain]
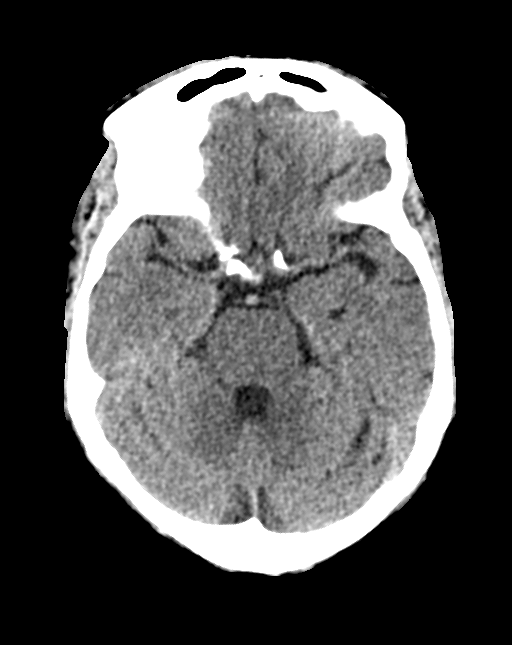
[im 18/47  brain]
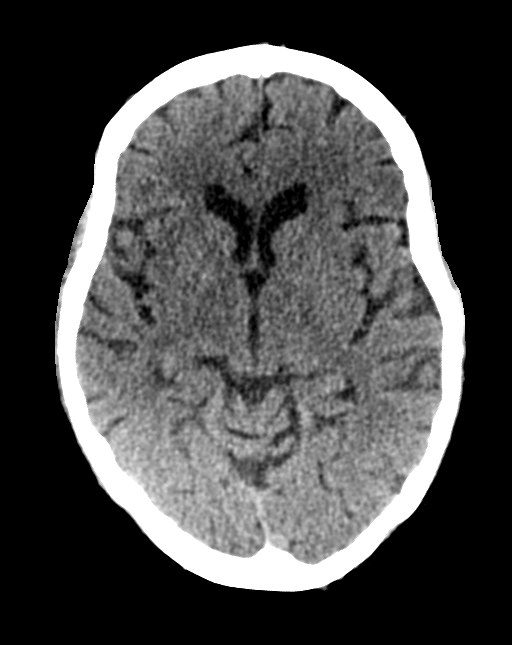
[im 24/47  brain]
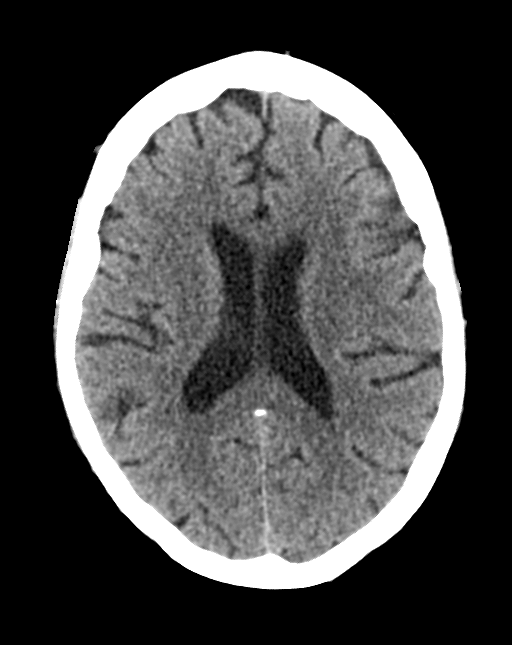
[im 29/47  brain]
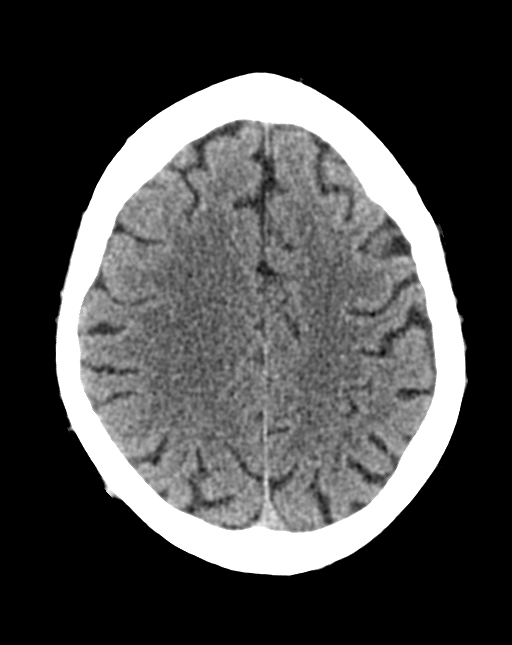
[im 35/47  brain]
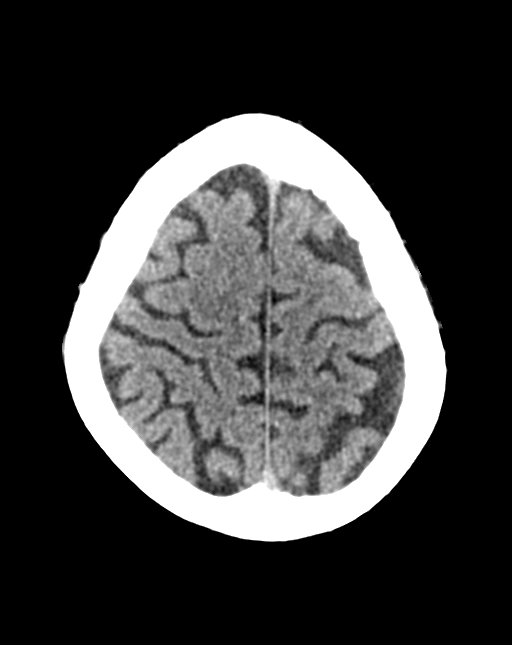
[im 41/47  brain]
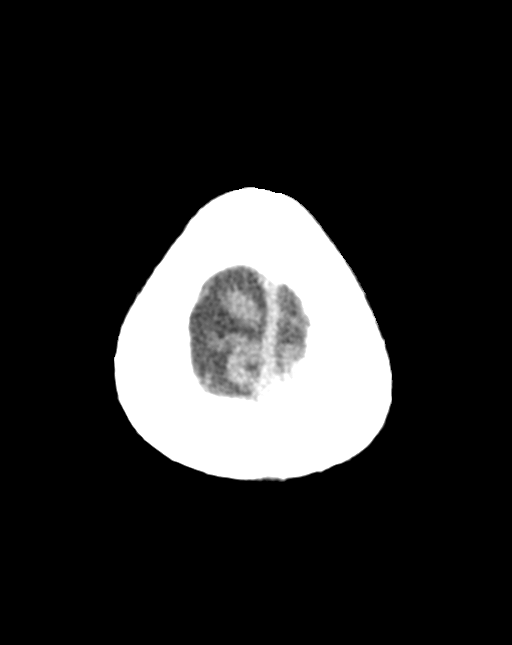

[Series 4: head bone · axial · 0.39mm/px · z∈[-296,-206]mm · 6 of 79 slices shown]
[im 6/79  bone]
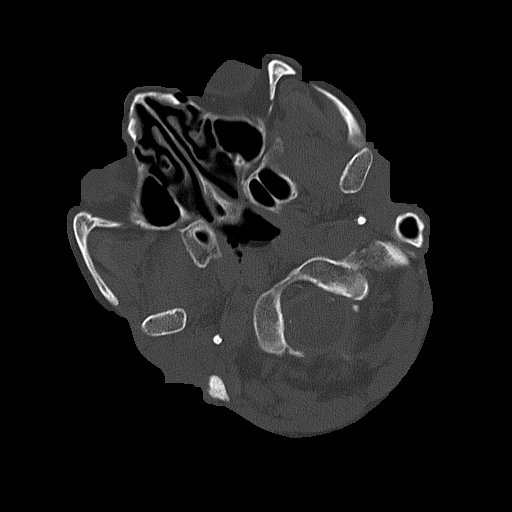
[im 17/79  bone]
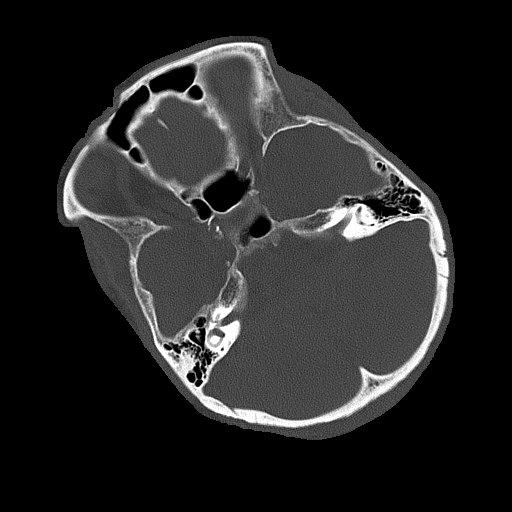
[im 28/79  bone]
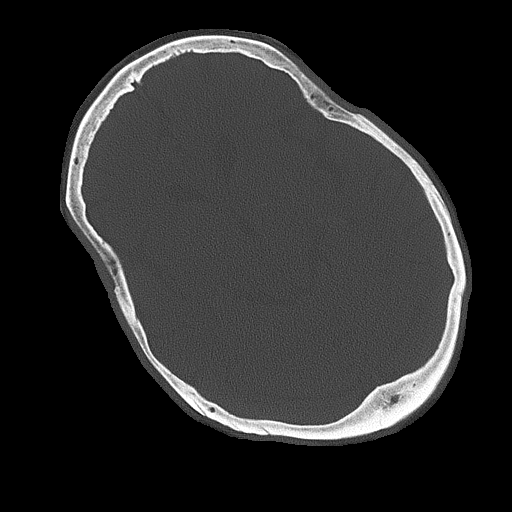
[im 34/79  bone]
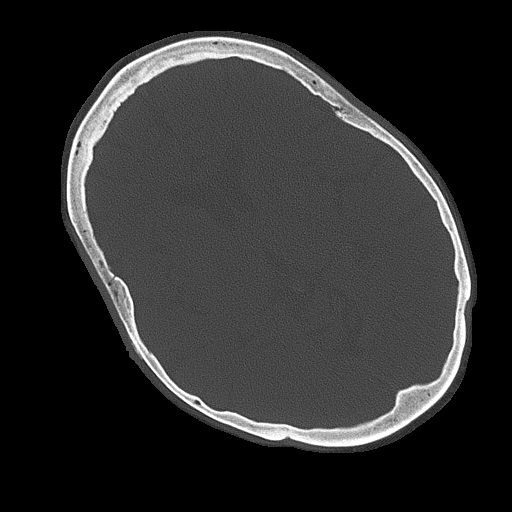
[im 45/79  bone]
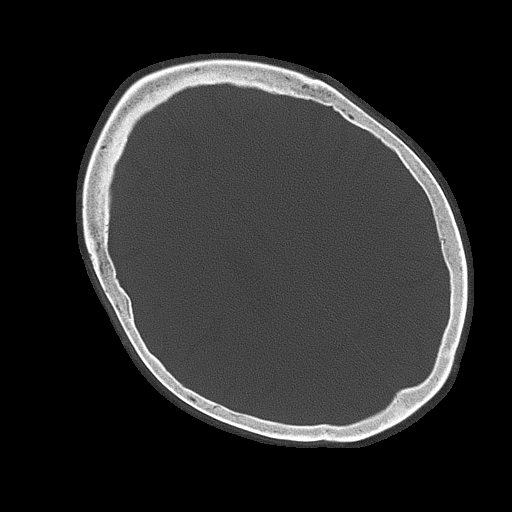
[im 51/79  bone]
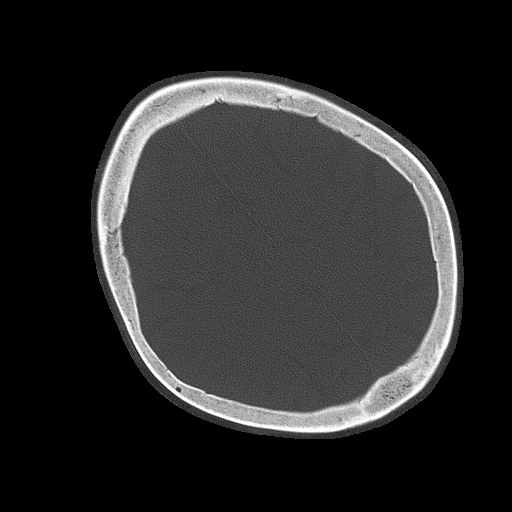

[Series 5: head without · axial · non-contrast · 0.39mm/px · z∈[-276,-186]mm · 4 of 32 slices shown, 5 images]
[im 7/32  brain]
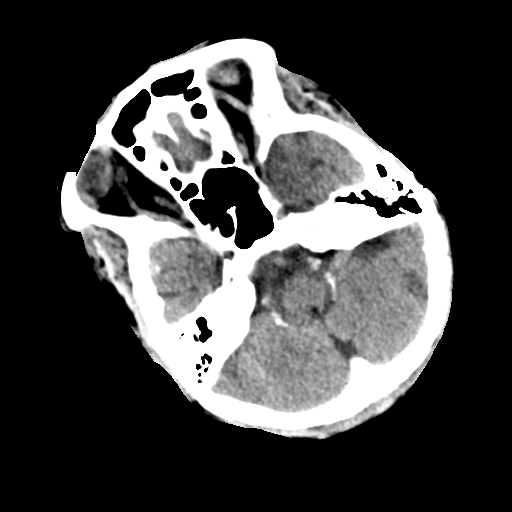
[im 7/32  bone]
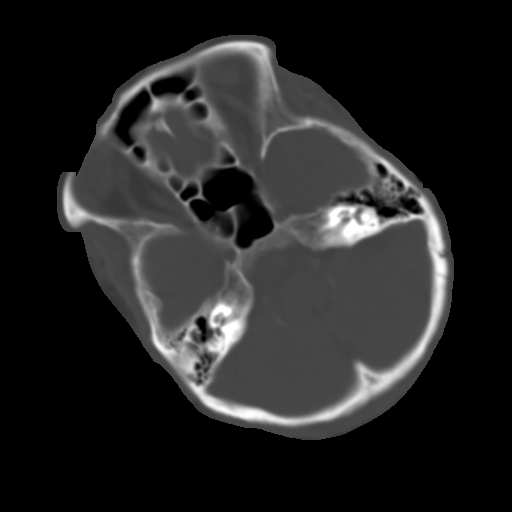
[im 13/32  brain]
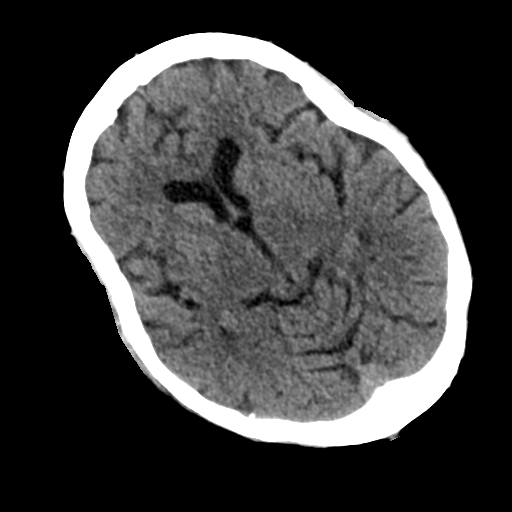
[im 19/32  brain]
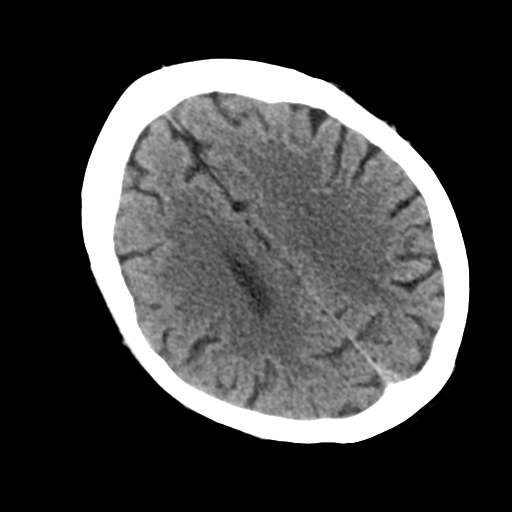
[im 25/32  brain]
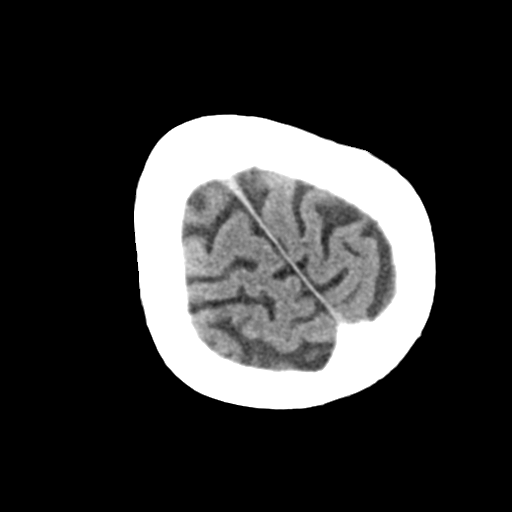

[17 of 30 positions shown; findings below may reference images not displayed]

FINDINGS: Brain: Mild atrophic changes are again identified. No findings to
suggest acute hemorrhage, acute infarction or space-occupying mass
lesion are noted.

Vascular: No hyperdense vessel or unexpected calcification.

Skull: Normal. Negative for fracture or focal lesion.

Sinuses/Orbits: No acute finding.

Other: None.
IMPRESSION: Chronic atrophic changes stable from the previous day. No acute
abnormality noted.

## 2021-07-19 IMAGING — DX DG CHEST 1V PORT
1 series · 1 of 1 positions shown · non-contrast
Comparison: Chest radiograph 1 day prior

CLINICAL DATA: Abnormal respiration

EXAM:
PORTABLE CHEST 1 VIEW

[chest]
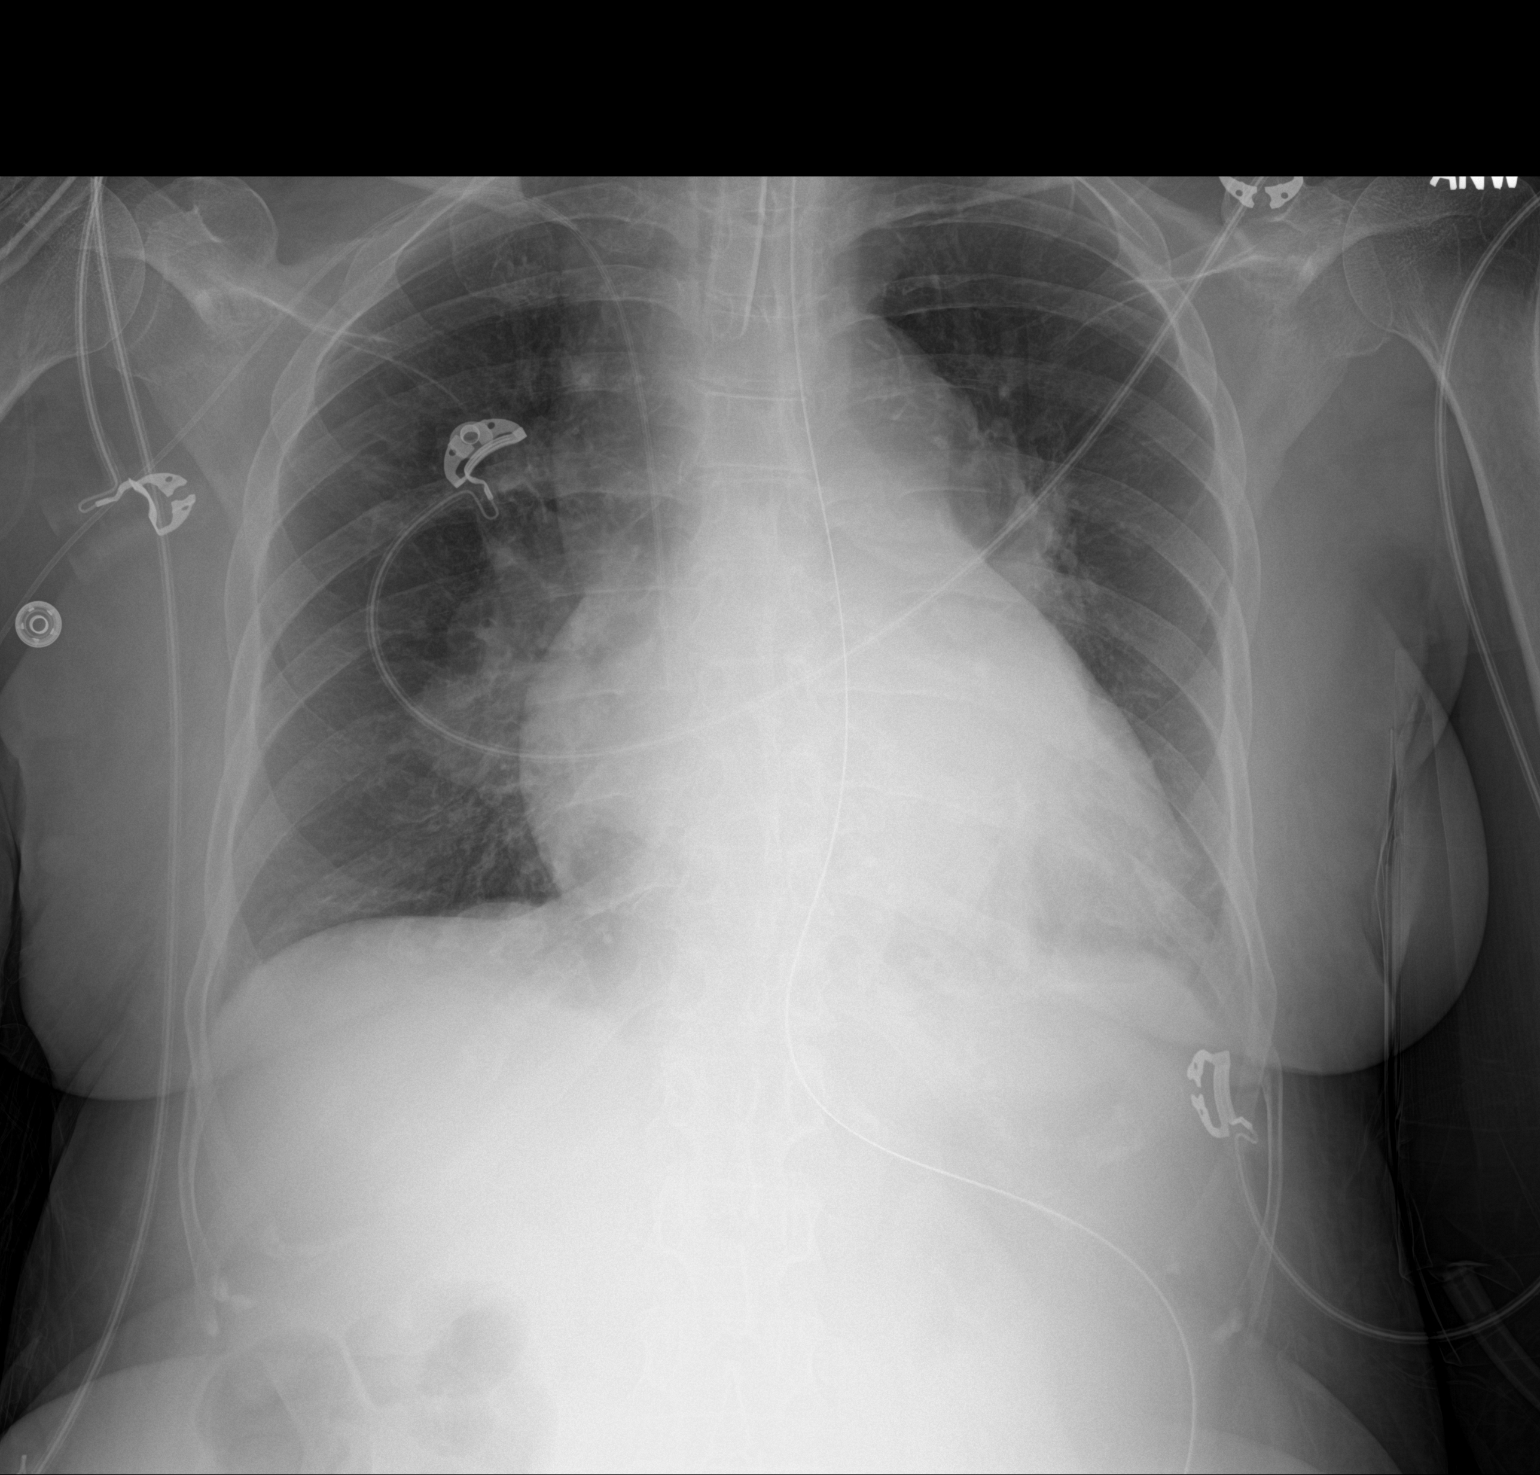

[1 of 1 positions shown; findings below may reference images not displayed]

FINDINGS: The endotracheal tube is in the midthoracic trachea approximately
2.7 cm from the carina. A right upper extremity PICC terminates in
the right atrium. The enteric catheter tip courses off the field of
view.

The heart is enlarged, unchanged. Right hilar adenopathy is again
seen, better evaluated on recent CT chest. Patchy opacities in the
left lower lobe are unchanged. There is no new or worsening airspace
disease. There is no pleural effusion or pneumothorax.

The bones are stable.
IMPRESSION: 1. Support devices as above.
2. Unchanged patchy opacities in the left lower lobe. No new or
worsening airspace disease.
3. Unchanged right hilar adenopathy.

## 2021-07-19 MED ORDER — LEVETIRACETAM IN NACL 500 MG/100ML IV SOLN
500.0000 mg | Freq: Two times a day (BID) | INTRAVENOUS | Status: AC
  Administered 2021-07-20 – 2021-07-25 (×12): 500 mg via INTRAVENOUS
  Filled 2021-07-19 (×13): qty 100

## 2021-07-19 MED ORDER — INSULIN ASPART 100 UNIT/ML IJ SOLN
3.0000 [IU] | INTRAMUSCULAR | Status: DC
  Administered 2021-07-19 – 2021-07-20 (×6): 3 [IU] via SUBCUTANEOUS

## 2021-07-19 MED ORDER — FUROSEMIDE 10 MG/ML IJ SOLN
40.0000 mg | Freq: Once | INTRAMUSCULAR | Status: AC
  Administered 2021-07-19: 40 mg via INTRAVENOUS
  Filled 2021-07-19: qty 4

## 2021-07-19 MED ORDER — DOBUTAMINE IN D5W 4-5 MG/ML-% IV SOLN
2.5000 ug/kg/min | INTRAVENOUS | Status: DC
  Administered 2021-07-19: 2.5 ug/kg/min via INTRAVENOUS
  Filled 2021-07-19 (×2): qty 250

## 2021-07-19 MED ORDER — LEVETIRACETAM IN NACL 1000 MG/100ML IV SOLN
1000.0000 mg | Freq: Once | INTRAVENOUS | Status: AC
  Administered 2021-07-19: 1000 mg via INTRAVENOUS
  Filled 2021-07-19: qty 100

## 2021-07-19 MED ORDER — INSULIN ASPART 100 UNIT/ML IJ SOLN
2.0000 [IU] | INTRAMUSCULAR | Status: DC
Start: 2021-07-19 — End: 2021-07-20
  Administered 2021-07-19 (×2): 4 [IU] via SUBCUTANEOUS
  Administered 2021-07-19: 2 [IU] via SUBCUTANEOUS
  Administered 2021-07-20: 4 [IU] via SUBCUTANEOUS
  Administered 2021-07-20: 2 [IU] via SUBCUTANEOUS

## 2021-07-19 MED ORDER — FAMOTIDINE IN NACL 20-0.9 MG/50ML-% IV SOLN
20.0000 mg | INTRAVENOUS | Status: DC
  Administered 2021-07-20 – 2021-07-21 (×2): 20 mg via INTRAVENOUS
  Filled 2021-07-19 (×2): qty 50

## 2021-07-19 MED ORDER — DEXTROSE 10 % IV SOLN: INTRAVENOUS | Status: DC

## 2021-07-19 NOTE — Progress Notes (Signed)
eLink Physician-Brief Progress Note Patient Name: Robin Estes DOB: March 30, 1946 MRN: [id number]   Date of Service  07/19/2021  HPI/Events of Note  Patient oliguric. 40cc during this shift. Bladder scan - 3cc. CVP checked 21.  On levophed 8 mcg  eICU Interventions  Hold on further fluid resuscitation. DC mIVF     Intervention Category Intermediate Interventions: Oliguria - evaluation and management  Anton Cheramie Mechele Collin 07/19/2021, 2:37 AM

## 2021-07-19 NOTE — Progress Notes (Signed)
eLink Physician-Brief Progress Note Patient Name: Robin Estes DOB: 1945-10-22 MRN: [id number]   Date of Service  07/19/2021  HPI/Events of Note  Patient less responsive. RN Dc'd fentanyl gtt one hour ago. Of note, patient with decreased UOP this shift.  Camera check: Family at bedside. Updated on plan  eICU Interventions  F/u ABG, BMET Continue to hold sedation and reassess with day team        Hebah Bogosian Mechele Collin 07/19/2021, 6:30 AM

## 2021-07-19 NOTE — Progress Notes (Addendum)
Advanced Heart Failure Rounding Note   Subjective:    Remains intubated. Unresponsive. Weaning sedation.   CVP 18 NE at 18   On amio and lido. No further VT.  K 3.3 Mg 2.6  Scr stable   Echo 07/18/20 EF 35-40% Severe MR   Objective:   Weight Range:  Vital Signs:   Temp:  [96.1 F (35.6 C)-98.1 F (36.7 C)] 97.9 F (36.6 C) (11/19 0800) Pulse Rate:  [45-62] 61 (11/19 0800) Resp:  [12-24] 18 (11/19 0800) BP: (69-131)/(54-91) 91/70 (11/19 0800) SpO2:  [98 %-100 %] 100 % (11/19 0824) FiO2 (%):  [30 %-50 %] 30 % (11/19 0727) Weight:  [63 kg] 63 kg (11/19 0500)    Weight change: Filed Weights   07/18/21 1139 07/18/21 1526 07/19/21 0500  Weight: 63 kg 63 kg 63 kg    Intake/Output:   Intake/Output Summary (Last 24 hours) at 07/19/2021 0853 Last data filed at 07/19/2021 0800 Gross per 24 hour  Intake 4288.03 ml  Output 551 ml  Net 3737.03 ml     Physical Exam: General:  Intubated sedated HEENT: normal +ETT Neck: supple. JVP flat  . Carotids 2+ bilat; no bruits. No lymphadenopathy or thryomegaly appreciated. Cor: PMI nondisplaced. Regular rate & rhythm. No rubs, gallops or murmurs. Lungs: clear Abdomen: soft, nontender, nondistended. No hepatosplenomegaly. No bruits or masses. Good bowel sounds. Extremities: no cyanosis, clubbing, rash, edema Neuro: intubated/sedated  Telemetry: NSR 60s Personally reviewed   Labs: Basic Metabolic Panel: Recent Labs  Lab 07/17/21 2216 07/17/21 2227 07/18/21 0229 07/18/21 0248 07/18/21 0429 07/18/21 0450 07/18/21 0623 07/18/21 0652 07/18/21 1114 07/18/21 1343 07/18/21 2204  NA 141 143 140 143  --  140 139 141 137 134*  --   K 2.8* 2.7* 3.1* 3.2*  --  3.1* 3.3* 3.3* 3.4* 3.3*  --   CL 103 102 104 103  --   --   --  104  --  102  --   CO2 27  --  21*  --   --   --   --  21*  --  25  --   GLUCOSE 113* 114* 133* 121*  --   --   --  172*  --  288*  --   BUN <5* 3* 6* 5*  --   --   --  6*  --  7*  --   CREATININE  0.83 0.80 0.93 0.70 0.92  --   --  0.90  --  0.90  --   CALCIUM 8.2*  --  7.9*  --   --   --   --  8.4*  --  8.0*  --   MG  --   --  1.6*  --   --   --   --  2.3  --  2.6* 2.6*  PHOS  --   --   --   --   --   --   --   --   --  3.2 3.5    Liver Function Tests: Recent Labs  Lab 07/17/21 2216 07/18/21 0229  AST 41 56*  ALT 21 25  ALKPHOS 123 127*  BILITOT 1.3* 1.4*  PROT 6.3* 5.8*  ALBUMIN 2.9* 2.7*   No results for input(s): LIPASE, AMYLASE in the last 168 hours. No results for input(s): AMMONIA in the last 168 hours.  CBC: Recent Labs  Lab 07/17/21 2216 07/17/21 2227 07/18/21 0229 07/18/21 0248 07/18/21 0450 07/18/21 0623 07/18/21  1114 07/19/21 0541  WBC 4.3  --  7.6  --   --   --   --  9.7  NEUTROABS 3.2  --  4.9  --   --   --   --  8.7*  HGB 9.4*   < > 9.7* 11.6* 8.5* 8.8* 10.2* 9.7*  HCT 30.4*   < > 31.0* 34.0* 25.0* 26.0* 30.0* 31.4*  MCV 94.7  --  96.0  --   --   --   --  94.9  PLT 320  --  376  --   --   --   --  367   < > = values in this interval not displayed.    Cardiac Enzymes: No results for input(s): CKTOTAL, CKMB, CKMBINDEX, TROPONINI in the last 168 hours.  BNP: BNP (last 3 results) Recent Labs    04/18/21 1340 07/18/21 0230  BNP 124.9* 893.2*    ProBNP (last 3 results) No results for input(s): PROBNP in the last 8760 hours.    Other results:  Imaging: CT Head Wo Contrast  Result Date: 07/18/2021 CLINICAL DATA:  Altered mental status. EXAM: CT HEAD WITHOUT CONTRAST TECHNIQUE: Contiguous axial images were obtained from the base of the skull through the vertex without intravenous contrast. COMPARISON:  None. FINDINGS: Brain: There is mild atrophy, small vessel disease and atrophic ventriculomegaly. There is no midline shift. No asymmetry is seen concerning for an acute infarct, hemorrhage or mass. There is mild streak artifact on some of the lower slices due to overlying material. Basal cisterns are clear. Vascular: There are patchy  calcifications in the carotid siphons, distal vertebral arteries. There are no hyperdense central vessels. Skull: Normal. Negative for fracture or focal lesion. Sinuses/Orbits: Visualized orbital contents are unremarkable. There is patchy membrane thickening in the ethmoid air cells. Other visualized sinuses and bilateral mastoid air cells are clear with maxillary sinuses not included. Other: The patient is intubated. IMPRESSION: No acute intracranial CT findings. Chronic change. Vascular calcifications. Electronically Signed   By: Telford Nab M.D.   On: 07/18/2021 05:15   CT Angio Chest PE W and/or Wo Contrast  Result Date: 07/18/2021 CLINICAL DATA:  Cardiac arrest, VFib, high probability of pulmonary embolism. History of sarcoidosis according to the history from the prior chest CT with contrast. EXAM: CT ANGIOGRAPHY CHEST WITH CONTRAST TECHNIQUE: Multidetector CT imaging of the chest was performed using the standard protocol during bolus administration of intravenous contrast. Multiplanar CT image reconstructions and MIPs were obtained to evaluate the vascular anatomy. CONTRAST:  42m OMNIPAQUE IOHEXOL 350 MG/ML SOLN COMPARISON:  Portable chest from earlier today and CT chest with contrast 07/26/2019 FINDINGS: Cardiovascular: The pulmonary arteries are normal in caliber and well opacified through the segmental divisions. No embolus is seen. Subsegmental arteries are not well opacified. The thoracic aorta is normal in caliber with mild patchy calcific plaques in the arch and descending portions common descending segment tortuosity. Aortic enhancement is insufficient to assess its lumen. Central pulmonary veins are mildly distended but no more than previously. There is mild cardiomegaly with slight reflux into the IVC and panchamber involvement, small increased pericardial effusion compared to the prior study. Mediastinum/Nodes: Right mainstem bronchus intubation. This was seen on today's chest x-ray. The  tube should be withdrawn 5 cm for optimal placement. Extensive mediastinal and hilar adenopathy is similar to the prior study and some lymph nodes as before containing calcifications consistent with granulomatous disease process. This was seen previously and there has been no worsening  in the adenopathy but no improvement either. 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 No supraclavicular or axillary adenopathy is seen. Lungs/Pleura: There are multiple bilateral irregular pulmonary nodules, most numerous in the right upper lobe and most of them are in a peribronchovascular distribution which is commonly seen with sarcoid disease. No new or enlarging nodules are observed. Individual right upper lobe nodules measure up to 1.1 cm in size. Other scattered nodules in the remaining lungs are smaller. There are increased posterior pleural-parenchymal opacities in the right lower lobe which are probably due to atelectasis, and increased dense consolidation in the left lower lobe, potentially related to the right main bronchus intubation or potentially due to pneumonia. There is increased subpleural septal thickening in the apices and bases which could be progression of interstitial lung disease or interstitial edema. Similarly there is increased perihilar ground-glass haziness in the upper lobes which could be due to interstitial disease or edema. Upper Abdomen: No acute abnormality. Musculoskeletal: No chest wall abnormality. No acute or significant osseous findings. Review of the MIP images confirms the above findings. IMPRESSION: 1. No arterial embolism is seen through the segmental arteries. The subsegmental arteries are not well seen. 2. Right main bronchus intubation. Withdraw tube 5 cm for optimal position. 3. Dense consolidation with air bronchograms in the left lower lobe which could be due to the right main bronchus intubation or consolidation of other  etiology. 4. No change in extensive mediastinal and hilar adenopathy and lung nodules presumably sarcoid related. 5. Interstitial changes in the lung apices and bases which could be due to edema or progression of interstitial lung disease related to sarcoid. No pleural effusion is seen. 6. Cardiomegaly with mildly distended pulmonary veins. 7. 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 Follow-up as indicated. Electronically Signed   By: Telford Nab M.D.   On: 07/18/2021 05:42   DG Chest Portable 1 View  Result Date: 07/18/2021 CLINICAL DATA:  ETT adjustment EXAM: PORTABLE CHEST 1 VIEW COMPARISON:  Earlier same day FINDINGS: 0555 hours. Endotracheal tube tip has been repositioned 3.5 cm above the base of the carina. The NG tube passes into the stomach although the distal tip position is not included on the film. The cardio pericardial silhouette is enlarged. Substantial fullness in the right hilar region compatible with mediastinal and hilar lymphadenopathy seen on recent chest CT. The visualized bony structures of the thorax show no acute abnormality. Telemetry leads overlie the chest. IMPRESSION: 1. Endotracheal tube tip has been repositioned, now 3.5 cm above the base of the carina. Electronically Signed   By: Misty Stanley M.D.   On: 07/18/2021 06:22   DG Chest Portable 1 View  Result Date: 07/18/2021 CLINICAL DATA:  Intubation EXAM: PORTABLE CHEST 1 VIEW COMPARISON:  03/18/2019 FINDINGS: The tip of the endotracheal tube is just within the right mainstem bronchus. Mild bilateral parahilar opacities. Normal cardiomediastinal contours. IMPRESSION: Endotracheal tube tip just within the right mainstem bronchus. Retraction by approximately 3 cm should place the tip at the level of the clavicular heads. These results will be called to the ordering clinician or representative by the Radiologist Assistant, and communication documented in the PACS or [employer]. Electronically Signed   By: Ulyses Jarred M.D.   On: 07/18/2021 03:01   ECHOCARDIOGRAM COMPLETE  Result Date: 07/18/2021    ECHOCARDIOGRAM REPORT   Patient Name:   ELAYSIA DEVARGAS Date of Exam: 07/18/2021 Medical  Rec #:  [id number]       Height:       62.0 in Accession #:    [id number]      Weight:       137.6 lb Date of Birth:  Apr 16, 1946        BSA:          1.631 m Patient Age:    75 years        BP:           123/77 mmHg Patient Gender: F               HR:           54 bpm. Exam Location:  Inpatient Procedure: 2D Echo, Cardiac Doppler and Color Doppler Indications:    Cardiac arrest  History:        Patient has prior history of Echocardiogram examinations, most                 recent 04/08/2020. CHF; Mitral Valve Disease. Respiratory failure                 requiring mechanical ventilation.  Sonographer:    Merrie Roof RDCS Referring Phys: [place]  1. Left ventricular ejection fraction, by estimation, is 35 to 40%. The left ventricle has moderately decreased function. The left ventricle demonstrates global hypokinesis. Left ventricular diastolic parameters are consistent with Grade I diastolic dysfunction (impaired relaxation). Elevated left ventricular end-diastolic pressure.  2. Right ventricular systolic function is normal. The right ventricular size is normal. There is normal pulmonary artery systolic pressure.  3. Left atrial size was severely dilated.  4. Posterior leaflet severely restricted. 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 However, there is no mitral stenosis by PHT or mean gradient. Carpentier Class IIIa mitral regurgitation. The mitral valve is normal in  structure. Severe mitral valve regurgitation. No evidence of mitral stenosis. Moderate mitral annular calcification.  5. Tricuspid valve regurgitation is moderate to severe.  6. The aortic valve is tricuspid. There is moderate calcification of the aortic valve. There is moderate thickening of the aortic valve. Aortic  valve regurgitation is not visualized. Aortic valve sclerosis/calcification is present, without any evidence of aortic stenosis. FINDINGS  Left Ventricle: Left ventricular ejection fraction, by estimation, is 35 to 40%. The left ventricle has moderately decreased function. The left ventricle demonstrates global hypokinesis. The left ventricular internal cavity size was normal in size. There is no left ventricular hypertrophy. Left ventricular diastolic parameters are consistent with Grade I diastolic dysfunction (impaired relaxation). Elevated left ventricular end-diastolic pressure. Right Ventricle: The right ventricular size is normal. No increase in right ventricular wall thickness. Right ventricular systolic function is normal. There is normal pulmonary artery systolic pressure. The tricuspid regurgitant velocity is 2.52 m/s, and  with an assumed right atrial pressure of 8 mmHg, the estimated right ventricular systolic pressure is 67.3 mmHg. Left Atrium: Left atrial size was severely dilated. Right Atrium: Right atrial size was normal in size. Pericardium: There is no evidence of pericardial effusion. Mitral Valve: Posterior leaflet severely restricted. 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 However, there is no mitral stenosis by PHT or mean gradient. Carpentier Class IIIa mitral regurgitation. The mitral valve is  normal in structure. There is moderate thickening of the anterior and posterior mitral valve leaflet(s). Severely decreased mobility of the mitral valve leaflets. Moderate mitral annular calcification. Severe mitral valve regurgitation. No evidence of mitral valve  stenosis. The mean mitral valve gradient is 2.9 mmHg with average heart rate of 59 bpm. Tricuspid Valve: The tricuspid valve is normal in structure. Tricuspid valve regurgitation is moderate to severe. No evidence of tricuspid stenosis. Aortic Valve: The aortic valve is tricuspid. There is moderate  calcification of the aortic valve. There is moderate thickening of the aortic valve. Aortic valve regurgitation is not visualized. Aortic valve sclerosis/calcification is present, without any  evidence of aortic stenosis. Aortic valve mean gradient measures 3.0 mmHg. Aortic valve peak gradient measures 4.6 mmHg. Aortic valve area, by VTI measures 1.41 cm. Pulmonic Valve: The pulmonic valve was normal in structure. Pulmonic valve regurgitation is trivial. No evidence of pulmonic stenosis. Aorta: The aortic root is normal in size and structure. Venous: IVC assessment for right atrial pressure unable to be performed due to mechanical ventilation. IAS/Shunts: No atrial level shunt detected by color flow Doppler.  LEFT VENTRICLE PLAX 2D LVIDd:         4.30 cm     Diastology LV PW:         1.00 cm     LV e' medial:    5.11 cm/s LV IVS:        0.80 cm     LV E/e' medial:  25.4 LVOT diam:     1.80 cm     LV e' lateral:   4.30 cm/s LV SV:         36          LV E/e' lateral: 30.2 LV SV Index:   22 LVOT Area:     2.54 cm  LV Volumes (MOD) LV vol d, MOD A2C: 96.9 ml LV vol d, MOD A4C: 68.1 ml LV vol s, MOD A2C: 58.3 ml LV vol s, MOD A4C: 42.1 ml LV SV MOD A2C:     38.6 ml LV SV MOD A4C:     68.1 ml LV SV MOD BP:      32.9 ml RIGHT VENTRICLE            IVC RV Basal diam:  3.10 cm    IVC diam: 1.90 cm RV S prime:     8.92 cm/s TAPSE (M-mode): 1.7 cm LEFT ATRIUM             Index        RIGHT ATRIUM           Index LA diam:        4.40 cm 2.70 cm/m   RA Area:     15.60 cm LA Vol (A2C):   75.2 ml 46.11 ml/m  RA Volume:   41.20 ml  25.26 ml/m LA Vol (A4C):   90.3 ml 55.37 ml/m LA Biplane Vol: 88.4 ml 54.21 ml/m  AORTIC VALVE AV Area (Vmax):    1.51 cm AV Area (Vmean):   1.33 cm AV Area (VTI):     1.41 cm AV Vmax:           107.00 cm/s AV Vmean:          74.900 cm/s AV VTI:            0.252 m AV Peak Grad:      4.6 mmHg AV Mean Grad:      3.0 mmHg LVOT Vmax:         63.70 cm/s LVOT Vmean:        39.100 cm/s LVOT VTI:           0.140 m LVOT/AV  VTI ratio: 0.56  AORTA Ao Root diam: 2.10 cm Ao Asc diam:  2.90 cm MITRAL VALVE                  TRICUSPID VALVE MV Area (PHT): 4.60 cm       TR Peak grad:   25.4 mmHg MV Mean grad:  2.9 mmHg       TR Vmax:        252.00 cm/s MV Decel Time: 165 msec MR Peak grad:    90.2 mmHg    SHUNTS MR Mean grad:    58.0 mmHg    Systemic VTI:  0.14 m MR Vmax:         475.00 cm/s  Systemic Diam: 1.80 cm MR Vmean:        354.0 cm/s MR PISA:         3.08 cm MR PISA Eff ROA: 20 mm MR PISA Radius:  0.70 cm MV E velocity: 130.00 cm/s MV A velocity: 63.70 cm/s MV E/A ratio:  2.04 Skeet Latch MD Electronically signed by Skeet Latch MD Signature Date/Time: 07/18/2021/2:44:41 PM    Final    [place] EKG SITE RITE  Result Date: 07/18/2021 If Site Rite image not attached, placement could not be confirmed due to current cardiac rhythm.    Medications:     Scheduled Medications:  chlorhexidine gluconate (MEDLINE KIT)  15 mL Mouth Rinse BID   Chlorhexidine Gluconate Cloth  6 each Topical Daily   heparin  5,000 Units Subcutaneous Q8H   mouth rinse  15 mL Mouth Rinse 10 times per day   methylPREDNISolone (SOLU-MEDROL) injection  60 mg Intravenous Daily   sodium chloride flush  10-40 mL Intracatheter Q12H    Infusions:  amiodarone 30 mg/hr (07/19/21 0800)   famotidine (PEPCID) IV Stopped (07/18/21 2239)   feeding supplement (VITAL AF 1.2 CAL) 55 mL/hr at 07/18/21 2000   fentaNYL infusion INTRAVENOUS Stopped (07/19/21 0602)   lidocaine 2 mg/min (07/19/21 0800)   norepinephrine (LEVOPHED) Adult infusion 14 mcg/min (07/19/21 0800)    PRN Medications: docusate, fentaNYL, midazolam, polyethylene glycol, sodium chloride flush   Assessment/Plan:   1. 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 Can drop lido to 1 and then wean off  if stable. - Keep K > 4.0 and Mg > 2.0 - Will need cath to make sure no high-grade ischemia - Once extubated will need repeat cMRI and likely outpatient PET to better assess for cardiac sarcoid - Agree with empiric steroids - Exam concerning for anoxic brain injury. Wean sedation. May need MRI   2. Chronic Systolic Heart Failure -> cardiogenic shock  - ECHO 03/2019 EF 30-35% . Myoview - no ischemia. 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 RV normal. Minimal LGE -> doubt significant cardiac sarcoid - Echo 12/20 EF ~45% MV appears thickened and possible rheumatic with 3+ MR. Severe LAE. Mod TR.  - TEE 8/21: LVEF 40-45% w/ mild global HK moderate to severe functional central MR. - Echo 07/18/20 EF 35-40% Severe MR - At baseline NYHA class II symptoms - On NE 18. CVP 18. - Holding GDMT with arrest and shock - Check co-ox - Give one dose of lasix - Plan as above   3. Acute hypoxic respiratory failure in setting of #1 - wean vent as tolerated -  CCM managing  - concern for anoxic injury   4. MAT - Zio in 9/20       1. Sinus rhythm - avg HR of 82      2. 22 Supraventricular Tachycardia runs occurred, the run with the fastest interval lasting 5 beats with a max rate of 184 bpm, the longest lasting 10.9 secs with an avg rate of 162 bpm.     3. Isolated PACs were occasional (4.7%, [id number])     4. Rare PVCs (<1.0%) - currently in NSR  5. 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 - She had been referred for possible [employer]. Did not keep appointment last fall due to gap in insurance coverage - Severe MR on echo.   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 Loanne Drilling in Pulmonary. Sarcoid not felt to be active previously - Plan as above. Agree with empiric steroids - Plan cMRI and PET   7. Hypokalemia/hypomagnesemia - Continue to supp aggressively as above  CRITICAL CARE Performed by: Glori Bickers  Total critical care time: 35 minutes  Critical care time was exclusive of separately billable procedures and treating other patients.  Critical care was necessary to treat or prevent imminent or life-threatening deterioration.  Critical care was time spent personally by me (independent of midlevel providers or residents) on the following activities: development of treatment plan with patient and/or surrogate as well as nursing, discussions with consultants, evaluation of patient's response to treatment, examination of patient, obtaining history from patient or surrogate, ordering and performing treatments and interventions, ordering and review of laboratory studies, ordering and review of radiographic studies, pulse oximetry and re-evaluation of patient's condition.   Length of Stay: 1   Glori Bickers MD 07/19/2021, 8:53 AM  Advanced Heart Failure Team Pager [phone] (M-F; [place])  Please contact [facility] for night-coverage after hours (4p -7a ) and weekends on [email or web address]

## 2021-07-19 NOTE — Progress Notes (Deleted)
eLink Physician-Brief Progress Note Patient Name: Robin Estes DOB: 07-04-1946 MRN: [id number]   Date of Service  07/19/2021  HPI/Events of Note  Restraints expired on critically ill patient on vent  eICU Interventions  Renewed bilateral wrist restraints        Tymber Stallings Mechele Collin 07/19/2021, 3:11 AM

## 2021-07-19 NOTE — Procedures (Signed)
Patient Name: Robin Estes  MRN: [id number]  Epilepsy Attending: Charlsie Quest  Referring Physician/Provider: Dr Lynnell Catalan Date: 07/19/2021 Duration: 23.11 mins  Patient history: 75y o M s/p cardiac arrest. EEG to evaluate for seizure  Level of alertness: comatose  AEDs during EEG study: None  Technical aspects: This EEG study was done with scalp electrodes positioned according to the 10-20 International system of electrode placement. Electrical activity was acquired at a sampling rate of 500Hz  and reviewed with a high frequency filter of 70Hz  and a low frequency filter of 1Hz . EEG data were recorded continuously and digitally stored.   Description:EEG showed continuous generalized plow amplitude 2-3 Hz delta slowing. EEG was reactive to tactile stimulation. Hyperventilation and photic stimulation were not performed.     ABNORMALITY - Continuous slow, generalized  IMPRESSION: This study is suggestive of severe diffuse encephalopathy, nonspecific etiology. No seizures or epileptiform discharges were seen throughout the recording.  Lovelle Lema 

## 2021-07-19 NOTE — Progress Notes (Signed)
EEG completed, results pending. 

## 2021-07-19 NOTE — Progress Notes (Signed)
NAME:  Robin Estes, MRN:  [id number], DOB:  Apr 22, 1946, LOS: 1 ADMISSION DATE:  07/17/2021, CONSULTATION DATE:  11/18 REFERRING MD:  Dr. Wyvonnia Dusky, CHIEF COMPLAINT:  VF arrest   History of Present Illness:  75 year old female with PMH as below, which is significant for sarcoidosis (pulmonary, eye, skin) biopsy proven, MAT, HTN, and NICM with LVEF 40-45%. Not felt to have significant cardiac sarcoid involvement at last OV. Presented to [facility] ED 11/18 after suffering a syncopal event at home. While in the waiting room she unfortunately suffered a VF cardiac arrest. Arrest was relatively brief (approximately 5 mins of CPR) until she was ultimately defibrillated into ROSC. Laboratory evaluation significant for K 2.7 and mag 1.6. Evaluated by cardiology in the ED who felt the electrolyte abnormalities were the likely etiology of the arrest. The patient was of course intubated as a result of the cardiac arrest, and PCCM was asked to admit. Post arrest she has been making purposeful movement per ED staff.   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   Significant Hospital Events: Including procedures, antibiotic start and stop dates in addition to other pertinent events   11/18 admit s/p VF arrest.  With multiple episodes of V. fib. 07/18/2021 multiple  episodes of V. fib arrest now with lidocaine plus amiodarone infusion 11/19 less responsive today  Objective   Blood pressure (!) 107/59, pulse (!) 58, temperature (!) 96.8 F (36 C), resp. rate 12, height 5\' 4"  (1.626 m), weight 63 kg, SpO2 100 %. CVP:  [17 mmHg-21 mmHg] 17 mmHg  Vent Mode: PRVC FiO2 (%):  [30 %-40 %] 30 % Set Rate:  [12 bmp] 12 bmp Vt Set:  [460 mL] 460 mL PEEP:  [5 cmH20] 5 cmH20 Pressure Support:  [12 cmH20-15 cmH20] 15 cmH20 Plateau Pressure:  [20 cmH20] 20 cmH20   Intake/Output Summary (Last 24 hours) at 07/19/2021 1246 Last data filed at 07/19/2021 1100 Gross per 24 hour  Intake 3498.61 ml  Output 396 ml  Net 3102.61 ml   Filed Weights   07/18/21 1139 07/18/21 1526 07/19/21 0500  Weight: 63 kg 63 kg 63 kg    Examination: General: Elderly female, frail, intubated.  Off all sedation HEENT: Endotracheal tube and gastric tube are in place.  Alopecia  Neuro: No response to painful stimulation.  No doll's eyes.  Occasional left lip twitching CV: Bradycardic 56.  Heart sounds unremarkable. PULM: Chest clear to auscultation. GI: soft, bsx4 active  GU: Amber urine Extremities: warm/dry, 1+ edema  Skin: n left scalp indications of sarcoid   Ancillary tests personally reviewed  SCV O2 35%  Sodium has dropped to 124. Creatinine has doubled to [id number] Metabolic acidosis with CO2 19.  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 -Continue to titrate  norepinephrine to keep MAP greater than 65 -Initiate dobutamine at 2.5 mcg/kg/min and titrate to SCV O2 greater than 60 -Improving cardiac output will hopefully improve cerebral and renal perfusion -If no further dysrhythmias on dobutamine can stop lidocaine as lidocaine accumulation with renal failure will also contribute to unresponsiveness -CT head and EEG as preliminary work-up for anoxic brain injury   Best Practice (right click and "Reselect all SmartList Selections" daily)   Diet/type: NPO start tube feeds DVT prophylaxis: LMWH GI prophylaxis: PPI Lines: N/A Foley:  Yes, and it is still needed Code Status:  full code Last date of multidisciplinary goals of care discussion: Daughter updated by myself 11/19 she understands the gravity of the situation given her possible anoxic brain injury following cardiac arrest.  Labs   CBC: Recent Labs  Lab 07/17/21 2216 07/17/21 2227 07/18/21 0229 07/18/21 0248 07/18/21 0450 07/18/21 0623 07/18/21 1114 07/19/21 0541  WBC 4.3  --  7.6  --   --   --   --  9.7  NEUTROABS 3.2  --  4.9  --   --   --   --  8.7*  HGB 9.4*   < > 9.7* 11.6* 8.5* 8.8* 10.2* 9.7*  HCT 30.4*   < > 31.0* 34.0* 25.0* 26.0* 30.0* 31.4*  MCV 94.7  --  96.0  --   --   --   --  94.9  PLT 320  --  376  --   --   --   --  367   < > = values in this interval not displayed.     Basic Metabolic Panel: Recent Labs  Lab 07/17/21 2216 07/17/21 2227 07/18/21 0229 07/18/21 0248 07/18/21 0429 07/18/21 0450 07/18/21 0623 07/18/21 0652 07/18/21 1114 07/18/21 1343 07/18/21 2204 07/19/21 1039  NA 141   < > 140 143  --    < > 139 141 137 134*  --  124*  K 2.8*   < > 3.1* 3.2*  --    < > 3.3* 3.3* 3.4* 3.3*  --  4.2  CL 103   < > 104 103  --   --   --  104  --  102  --  94*  CO2 27  --  21*  --   --   --   --  21*  --  25  --  19*  GLUCOSE 113*   < > 133* 121*  --   --   --  172*  --  288*  --  476*  BUN <5*   < > 6* 5*  --   --   --  6*  --  7*  --  12  CREATININE  0.83   < > 0.93 0.70 0.92  --   --  0.90  --  0.90  --  1.89*  CALCIUM 8.2*  --  7.9*  --   --   --   --  8.4*  --  8.0*  --  7.6*  MG  --   --  1.6*  --   --   --   --  2.3  --  2.6* 2.6* 2.4  PHOS  --   --   --   --   --   --   --   --   --  3.2 3.5  --    < > = values in this interval not displayed.    GFR: Estimated Creatinine Clearance: 21.9 mL/min (A) (by C-G formula based on SCr of 1.89 mg/dL (H)). Recent Labs  Lab 07/17/21 2216 07/18/21 0229 07/18/21 0429 07/19/21 0541  WBC 4.3 7.6  --  9.7  LATICACIDVEN  --  5.4* 4.7*  --      Liver Function Tests: Recent Labs  Lab 07/17/21 2216 07/18/21 0229  AST 41 56*  ALT 21 25  ALKPHOS 123 127*  BILITOT 1.3* 1.4*  PROT 6.3* 5.8*  ALBUMIN 2.9* 2.7*    No results for input(s): LIPASE, AMYLASE in the last 168 hours. No results for input(s): AMMONIA in the last 168 hours.  ABG    Component Value Date/Time   PHART 7.442 07/18/2021 1114   PCO2ART 36.6 07/18/2021 1114   PO2ART 62 (L) 07/18/2021 1114   HCO3 25.1 07/18/2021 1114   TCO2 26 07/18/2021 1114   O2SAT 34.9 07/19/2021 1013      Coagulation Profile: No results for input(s): INR, PROTIME in the last 168 hours.  Cardiac Enzymes: No results for input(s): CKTOTAL, CKMB, CKMBINDEX, TROPONINI in the last 168 hours.  HbA1C: Hgb A1c MFr Bld  Date/Time Value Ref Range Status  03/16/2019 02:25 AM 5.6 4.8 - 5.6 % Final    Comment:    (NOTE) Pre diabetes:          5.7%-6.4% Diabetes:              >6.4% Glycemic control for   <7.0% adults with diabetes     CBG: Recent Labs  Lab 07/18/21 0218 07/18/21 2031 07/19/21 0027 07/19/21 0741 07/19/21 1117  GLUCAP 94 150* 182* 165* 193*    CRITICAL CARE Performed by: Lynnell Catalan   Total critical care time: 40 minutes  Critical care time was exclusive of separately billable procedures and treating other patients.  Critical care was necessary to treat or prevent imminent or life-threatening  deterioration.  Critical care was time spent personally by me on the following activities: development of treatment plan with patient and/or surrogate as well as nursing, discussions with consultants, evaluation of patient's response to treatment, examination of patient, obtaining history from patient or surrogate, ordering and performing treatments and interventions, ordering and review of laboratory studies, ordering and review of radiographic studies, pulse oximetry, re-evaluation of patient's condition and participation in multidisciplinary rounds.  Lynnell Catalan, MD [facility] ICU Physician [facility]  Pager: [phone] Mobile: [phone] After hours: [phone].  07/19/2021, 12:46 PM

## 2021-07-20 LAB — BASIC METABOLIC PANEL
Anion gap: 9 (ref 5–15)
Anion gap: 8 (ref 5–15)
BUN: 16 mg/dL (ref 8–23)
BUN: 18 mg/dL (ref 8–23)
CO2: 20 mmol/L — ABNORMAL LOW (ref 22–32)
CO2: 22 mmol/L (ref 22–32)
Calcium: 7.5 mg/dL — ABNORMAL LOW (ref 8.9–10.3)
Calcium: 7.7 mg/dL — ABNORMAL LOW (ref 8.9–10.3)
Chloride: 94 mmol/L — ABNORMAL LOW (ref 98–111)
Chloride: 98 mmol/L (ref 98–111)
Creatinine, Ser: 2.04 mg/dL — ABNORMAL HIGH (ref 0.44–1.00)
Creatinine, Ser: 2 mg/dL — ABNORMAL HIGH (ref 0.44–1.00)
GFR, Estimated: 25 mL/min — ABNORMAL LOW (ref >60)
GFR, Estimated: 25 mL/min — ABNORMAL LOW (ref >60)
Glucose, Bld: 352 mg/dL — ABNORMAL HIGH (ref 70–99)
Glucose, Bld: 234 mg/dL — ABNORMAL HIGH (ref 70–99)
Potassium: 3.4 mmol/L — ABNORMAL LOW (ref 3.5–5.1)
Potassium: 3.5 mmol/L (ref 3.5–5.1)
Sodium: 123 mmol/L — ABNORMAL LOW (ref 135–145)
Sodium: 128 mmol/L — ABNORMAL LOW (ref 135–145)

## 2021-07-20 LAB — POCT I-STAT 7, (LYTES, BLD GAS, ICA,H+H)
Acid-base deficit: 3 mmol/L — ABNORMAL HIGH (ref 0.0–2.0)
Bicarbonate: 21.3 mmol/L (ref 20.0–28.0)
Calcium, Ion: 1.13 mmol/L — ABNORMAL LOW (ref 1.15–1.40)
HCT: 29 % — ABNORMAL LOW (ref 36.0–46.0)
Hemoglobin: 9.9 g/dL — ABNORMAL LOW (ref 12.0–15.0)
O2 Saturation: 99 %
Patient temperature: 36.7
Potassium: 3.4 mmol/L — ABNORMAL LOW (ref 3.5–5.1)
Sodium: 130 mmol/L — ABNORMAL LOW (ref 135–145)
TCO2: 22 mmol/L (ref 22–32)
pCO2 arterial: 34 mmHg (ref 32.0–48.0)
pH, Arterial: 7.404 (ref 7.350–7.450)
pO2, Arterial: 127 mmHg — ABNORMAL HIGH (ref 83.0–108.0)

## 2021-07-20 LAB — GLUCOSE, CAPILLARY
Glucose-Capillary: 160 mg/dL — ABNORMAL HIGH (ref 70–99)
Glucose-Capillary: 139 mg/dL — ABNORMAL HIGH (ref 70–99)
Glucose-Capillary: 113 mg/dL — ABNORMAL HIGH (ref 70–99)
Glucose-Capillary: 118 mg/dL — ABNORMAL HIGH (ref 70–99)
Glucose-Capillary: 117 mg/dL — ABNORMAL HIGH (ref 70–99)

## 2021-07-20 LAB — COOXEMETRY PANEL
Carboxyhemoglobin: 0.8 % (ref 0.5–1.5)
Methemoglobin: 0.9 % (ref 0.0–1.5)
O2 Saturation: 77.8 %
Total hemoglobin: 9 g/dL — ABNORMAL LOW (ref 12.0–16.0)

## 2021-07-20 MED ORDER — FUROSEMIDE 10 MG/ML IJ SOLN
INTRAMUSCULAR | Status: AC
  Filled 2021-07-20: qty 8

## 2021-07-20 MED ORDER — ONDANSETRON HCL 4 MG/2ML IJ SOLN
4.0000 mg | Freq: Four times a day (QID) | INTRAMUSCULAR | Status: DC | PRN
  Administered 2021-07-20: 4 mg via INTRAVENOUS
  Filled 2021-07-20: qty 2

## 2021-07-20 MED ORDER — FUROSEMIDE 10 MG/ML IJ SOLN
80.0000 mg | Freq: Once | INTRAMUSCULAR | Status: AC
  Administered 2021-07-20: 80 mg via INTRAVENOUS

## 2021-07-20 MED ORDER — POTASSIUM CHLORIDE 20 MEQ PO PACK
40.0000 meq | PACK | Freq: Once | ORAL | Status: AC
  Administered 2021-07-20: 40 meq via ORAL
  Filled 2021-07-20: qty 2

## 2021-07-20 MED ORDER — CHLORHEXIDINE GLUCONATE 0.12 % MT SOLN
15.0000 mL | Freq: Two times a day (BID) | OROMUCOSAL | Status: DC
  Administered 2021-07-21 – 2021-07-28 (×15): 15 mL via OROMUCOSAL
  Filled 2021-07-20 (×13): qty 15

## 2021-07-20 MED ORDER — POTASSIUM CHLORIDE CRYS ER 20 MEQ PO TBCR: 40.0000 meq | EXTENDED_RELEASE_TABLET | Freq: Once | ORAL | Status: DC

## 2021-07-20 MED ORDER — POTASSIUM CHLORIDE 20 MEQ PO PACK
40.0000 meq | PACK | Freq: Once | ORAL | Status: AC
  Administered 2021-07-20: 40 meq
  Filled 2021-07-20: qty 2

## 2021-07-20 MED ORDER — ORAL CARE MOUTH RINSE
15.0000 mL | Freq: Two times a day (BID) | OROMUCOSAL | Status: DC
  Administered 2021-07-20 – 2021-07-26 (×8): 15 mL via OROMUCOSAL

## 2021-07-20 NOTE — Progress Notes (Signed)
eLink Physician-Brief Progress Note Patient Name: Latonga Ponder DOB: 23-Nov-1945 MRN: [id number]   Date of Service  07/20/2021  HPI/Events of Note  Agitation - Patient grabbed ETT. Sedation restarted. Nursing request for wrist restraint.   eICU Interventions  Plan: Bilateral soft wrist restraints X 8 hours.      Intervention Category Major Interventions: Delirium, psychosis, severe agitation - evaluation and management  Haji Delaine Eugene 07/20/2021, 1:02 AM

## 2021-07-20 NOTE — Progress Notes (Signed)
Advanced Heart Failure Rounding Note   Subjective:    Had persistent cardiogenic shock yesterday with co-ox 42% and lactic acid 5.  Dobutamine 5 added. NE now down 18-> 5   Head CT negative for acute process. Now awake and following commands on vent. Rhythm stable on IV amio. Lido off.   SCr stabilizing ~2.0    Echo 07/18/20 EF 35-40% Severe MR   Objective:   Weight Range:  Vital Signs:   Temp:  [96.8 F (36 C)-98.8 F (37.1 C)] 97.9 F (36.6 C) (11/20 0917) Pulse Rate:  [58-84] 84 (11/20 0917) Resp:  [12-29] 24 (11/20 0917) BP: (87-136)/(51-91) 89/65 (11/20 0917) SpO2:  [95 %-100 %] 100 % (11/20 0917) FiO2 (%):  [30 %] 30 % (11/20 0735) Weight:  [63 kg] 63 kg (11/20 0500) Last BM Date: 07/20/21  Weight change: Filed Weights   07/18/21 1526 07/19/21 0500 07/20/21 0500  Weight: 63 kg 63 kg 63 kg    Intake/Output:   Intake/Output Summary (Last 24 hours) at 07/20/2021 1003 Last data filed at 07/20/2021 0906 Gross per 24 hour  Intake 3774.15 ml  Output 510 ml  Net 3264.15 ml      Physical Exam: General:  Awake on vent following commands  HEENT: normal + ETT. Sarcoid changes Neck: supple. JVP 12 Carotids 2+ bilat; no bruits. No lymphadenopathy or thryomegaly appreciated. Cor: PMI nondisplaced. Regular rate & rhythm. No rubs, gallops or murmurs. Lungs: clear Abdomen: soft, nontender, nondistended. No hepatosplenomegaly. No bruits or masses. Good bowel sounds. Extremities: no cyanosis, clubbing, rash, edema Neuro: on vent following commands moves all 4    Telemetry: NSR 70s Personally reviewed   Labs: Basic Metabolic Panel: Recent Labs  Lab 07/18/21 1343 07/18/21 2204 07/19/21 0541 07/19/21 1039 07/19/21 1911 07/20/21 0315 07/20/21 0600 07/20/21 0636  NA 134*  --  126* 124* 124* 123* 128* 130*  K 3.3*  --  5.1 4.2 4.1 3.4* 3.5 3.4*  CL 102  --  96* 94* 95* 94* 98  --   CO2 25  --  19* 19* 19* 20* 22  --   GLUCOSE 288*  --  445* 476* 439*  352* 234*  --   BUN 7*  --  _0 --   CREATININE 0.90  --  1.64* 1.89* 2.12* 2.04* 2.00*  --   CALCIUM 8.0*  --  7.5* 7.6* 7.7* 7.5* 7.7*  --   MG 2.6* 2.6* 2.4 2.4 2.2  --   --   --   PHOS 3.2 3.5 4.6  --  3.9  --   --   --      Liver Function Tests: Recent Labs  Lab 07/17/21 2216 07/18/21 0229  AST 41 56*  ALT 21 25  ALKPHOS 123 127*  BILITOT 1.3* 1.4*  PROT 6.3* 5.8*  ALBUMIN 2.9* 2.7*    No results for input(s): LIPASE, AMYLASE in the last 168 hours. No results for input(s): AMMONIA in the last 168 hours.  CBC: Recent Labs  Lab 07/17/21 2216 07/17/21 2227 07/18/21 0229 07/18/21 0248 07/18/21 0450 07/18/21 0623 07/18/21 1114 07/19/21 0541 07/20/21 0636  WBC 4.3  --  7.6  --   --   --   --  9.7  --   NEUTROABS 3.2  --  4.9  --   --   --   --  8.7*  --   HGB 9.4*   < > 9.7*   < >  8.5* 8.8* 10.2* 9.7* 9.9*  HCT 30.4*   < > 31.0*   < > 25.0* 26.0* 30.0* 31.4* 29.0*  MCV 94.7  --  96.0  --   --   --   --  94.9  --   PLT 320  --  376  --   --   --   --  367  --    < > = values in this interval not displayed.     Cardiac Enzymes: No results for input(s): CKTOTAL, CKMB, CKMBINDEX, TROPONINI in the last 168 hours.  BNP: BNP (last 3 results) Recent Labs    04/18/21 1340 07/18/21 0230  BNP 124.9* 893.2*     ProBNP (last 3 results) No results for input(s): PROBNP in the last 8760 hours.    Other results:  Imaging: CT HEAD WO CONTRAST (5MM)  Result Date: 07/19/2021 CLINICAL DATA:  Possible anoxic brain injury EXAM: CT HEAD WITHOUT CONTRAST TECHNIQUE: Contiguous axial images were obtained from the base of the skull through the vertex without intravenous contrast. COMPARISON:  07/18/2021 FINDINGS: Brain: Mild atrophic changes are again identified. No findings to suggest acute hemorrhage, acute infarction or space-occupying mass lesion are noted. Vascular: No hyperdense vessel or unexpected calcification. Skull: Normal. Negative for fracture or  focal lesion. Sinuses/Orbits: No acute finding. Other: None. IMPRESSION: Chronic atrophic changes stable from the previous day. No acute abnormality noted. Electronically Signed   By: Inez Catalina M.D.   On: 07/19/2021 21:05   DG Chest Port 1 View  Result Date: 07/19/2021 CLINICAL DATA:  Abnormal respiration EXAM: PORTABLE CHEST 1 VIEW COMPARISON:  Chest radiograph 1 day prior FINDINGS: The endotracheal tube is in the midthoracic trachea approximately 2.7 cm from the carina. A right upper extremity PICC terminates in the right atrium. The enteric catheter tip courses off the field of view. The heart is enlarged, unchanged. Right hilar adenopathy is again seen, better evaluated on recent CT chest. Patchy opacities in the left lower lobe are unchanged. There is no new or worsening airspace disease. There is no pleural effusion or pneumothorax. The bones are stable. IMPRESSION: 1. Support devices as above. 2. Unchanged patchy opacities in the left lower lobe. No new or worsening airspace disease. 3. Unchanged right hilar adenopathy. Electronically Signed   By: Valetta Mole M.D.   On: 07/19/2021 12:23   ECHOCARDIOGRAM COMPLETE  Result Date: 07/18/2021    ECHOCARDIOGRAM REPORT   Patient Name:   Robin Estes Date of Exam: 07/18/2021 Medical Rec #:  [id number]       Height:       62.0 in Accession #:    [id number]      Weight:       137.6 lb Date of Birth:  [birth year]        BSA:          1.631 m Patient Age:    75 years        BP:           123/77 mmHg Patient Gender: F               HR:           54 bpm. Exam Location:  Inpatient Procedure: 2D Echo, Cardiac Doppler and Color Doppler Indications:    Cardiac arrest  History:        Patient has prior history of Echocardiogram examinations, most  recent 04/08/2020. CHF; Mitral Valve Disease. Respiratory failure                 requiring mechanical ventilation.  Sonographer:    Merrie Roof RDCS Referring Phys: [place]  1. Left  ventricular ejection fraction, by estimation, is 35 to 40%. The left ventricle has moderately decreased function. The left ventricle demonstrates global hypokinesis. Left ventricular diastolic parameters are consistent with Grade I diastolic dysfunction (impaired relaxation). Elevated left ventricular end-diastolic pressure.  2. Right ventricular systolic function is normal. The right ventricular size is normal. There is normal pulmonary artery systolic pressure.  3. Left atrial size was severely dilated.  4. Posterior leaflet severely restricted. 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 However, there is no mitral stenosis by PHT or mean gradient. Carpentier Class IIIa mitral regurgitation. The mitral valve is normal in  structure. Severe mitral valve regurgitation. No evidence of mitral stenosis. Moderate mitral annular calcification.  5. Tricuspid valve regurgitation is moderate to severe.  6. The aortic valve is tricuspid. There is moderate calcification of the aortic valve. There is moderate thickening of the aortic valve. Aortic valve regurgitation is not visualized. Aortic valve sclerosis/calcification is present, without any evidence of aortic stenosis. FINDINGS  Left Ventricle: Left ventricular ejection fraction, by estimation, is 35 to 40%. The left ventricle has moderately decreased function. The left ventricle demonstrates global hypokinesis. The left ventricular internal cavity size was normal in size. There is no left ventricular hypertrophy. Left ventricular diastolic parameters are consistent with Grade I diastolic dysfunction (impaired relaxation). Elevated left ventricular end-diastolic pressure. Right Ventricle: The right ventricular size is normal. No increase in right ventricular wall thickness. Right ventricular systolic function is normal. There is normal pulmonary artery systolic pressure. The tricuspid regurgitant velocity is 2.52 m/s, and  with an assumed  right atrial pressure of 8 mmHg, the estimated right ventricular systolic pressure is 78.2 mmHg. Left Atrium: Left atrial size was severely dilated. Right Atrium: Right atrial size was normal in size. Pericardium: There is no evidence of pericardial effusion. Mitral Valve: Posterior leaflet severely restricted. 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 However, there is no mitral stenosis by PHT or mean gradient. Carpentier Class IIIa mitral regurgitation. The mitral valve is  normal in structure. There is moderate thickening of the anterior and posterior mitral valve leaflet(s). Severely decreased mobility of the mitral valve leaflets. Moderate mitral annular calcification. Severe mitral valve regurgitation. No evidence of mitral valve stenosis. The mean mitral valve gradient is 2.9 mmHg with average heart rate of 59 bpm. Tricuspid Valve: The tricuspid valve is normal in structure. Tricuspid valve regurgitation is moderate to severe. No evidence of tricuspid stenosis. Aortic Valve: The aortic valve is tricuspid. There is moderate calcification of the aortic valve. There is moderate thickening of the aortic valve. Aortic valve regurgitation is not visualized. Aortic valve sclerosis/calcification is present, without any  evidence of aortic stenosis. Aortic valve mean gradient measures 3.0 mmHg. Aortic valve peak gradient measures 4.6 mmHg. Aortic valve area, by VTI measures 1.41 cm. Pulmonic Valve: The pulmonic valve was normal in structure. Pulmonic valve regurgitation is trivial. No evidence of pulmonic stenosis. Aorta: The aortic root is normal in size and structure. Venous: IVC assessment for right atrial pressure unable to be performed due to mechanical ventilation. IAS/Shunts: No atrial level shunt detected by color flow Doppler.  LEFT VENTRICLE PLAX 2D LVIDd:         4.30 cm  Diastology LV PW:         1.00 cm     LV e' medial:    5.11 cm/s LV IVS:        0.80 cm     LV  E/e' medial:  25.4 LVOT diam:     1.80 cm     LV e' lateral:   4.30 cm/s LV SV:         36          LV E/e' lateral: 30.2 LV SV Index:   22 LVOT Area:     2.54 cm  LV Volumes (MOD) LV vol d, MOD A2C: 96.9 ml LV vol d, MOD A4C: 68.1 ml LV vol s, MOD A2C: 58.3 ml LV vol s, MOD A4C: 42.1 ml LV SV MOD A2C:     38.6 ml LV SV MOD A4C:     68.1 ml LV SV MOD BP:      32.9 ml RIGHT VENTRICLE            IVC RV Basal diam:  3.10 cm    IVC diam: 1.90 cm RV S prime:     8.92 cm/s TAPSE (M-mode): 1.7 cm LEFT ATRIUM             Index        RIGHT ATRIUM           Index LA diam:        4.40 cm 2.70 cm/m   RA Area:     15.60 cm LA Vol (A2C):   75.2 ml 46.11 ml/m  RA Volume:   41.20 ml  25.26 ml/m LA Vol (A4C):   90.3 ml 55.37 ml/m LA Biplane Vol: 88.4 ml 54.21 ml/m  AORTIC VALVE AV Area (Vmax):    1.51 cm AV Area (Vmean):   1.33 cm AV Area (VTI):     1.41 cm AV Vmax:           107.00 cm/s AV Vmean:          74.900 cm/s AV VTI:            0.252 m AV Peak Grad:      4.6 mmHg AV Mean Grad:      3.0 mmHg LVOT Vmax:         63.70 cm/s LVOT Vmean:        39.100 cm/s LVOT VTI:          0.140 m LVOT/AV VTI ratio: 0.56  AORTA Ao Root diam: 2.10 cm Ao Asc diam:  2.90 cm MITRAL VALVE                  TRICUSPID VALVE MV Area (PHT): 4.60 cm       TR Peak grad:   25.4 mmHg MV Mean grad:  2.9 mmHg       TR Vmax:        252.00 cm/s MV Decel Time: 165 msec MR Peak grad:    90.2 mmHg    SHUNTS MR Mean grad:    58.0 mmHg    Systemic VTI:  0.14 m MR Vmax:         475.00 cm/s  Systemic Diam: 1.80 cm MR Vmean:        354.0 cm/s MR PISA:         3.08 cm MR PISA Eff ROA: 20 mm MR PISA Radius:  0.70 cm MV E velocity: 130.00 cm/s MV A velocity: 63.70 cm/s MV E/A  ratio:  2.04 Skeet Latch MD Electronically signed by Skeet Latch MD Signature Date/Time: 07/18/2021/2:44:41 PM    Final      Medications:     Scheduled Medications:  chlorhexidine gluconate (MEDLINE KIT)  15 mL Mouth Rinse BID   Chlorhexidine Gluconate Cloth  6 each  Topical Daily   heparin  5,000 Units Subcutaneous Q8H   insulin aspart  2-6 Units Subcutaneous Q4H   insulin aspart  3 Units Subcutaneous Q4H   mouth rinse  15 mL Mouth Rinse 10 times per day   sodium chloride flush  10-40 mL Intracatheter Q12H    Infusions:  amiodarone 30 mg/hr (07/20/21 0735)   dextrose 40 mL/hr at 07/20/21 0735   DOBUTamine 5 mcg/kg/min (07/20/21 0735)   famotidine (PEPCID) IV 20 mg (07/20/21 0906)   feeding supplement (VITAL AF 1.2 CAL) 1,000 mL (07/19/21 1218)   fentaNYL infusion INTRAVENOUS Stopped (07/19/21 0602)   levETIRAcetam Stopped (07/20/21 0422)   norepinephrine (LEVOPHED) Adult infusion 5 mcg/min (07/20/21 0735)    PRN Medications: docusate, fentaNYL, midazolam, ondansetron (ZOFRAN) IV, polyethylene glycol, sodium chloride flush   Assessment/Plan:   1. 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 Lido now off - Keep K > 4.0 and Mg > 2.0 - Will need cath to make sure no high-grade ischemia (have to wait un ATN recovers) - Once extubated will need repeat cMRI and likely outpatient PET to better assess for cardiac sarcoid - Continue with empiric steroids   2. Chronic Systolic Heart Failure -> cardiogenic shock  - ECHO 03/2019 EF 30-35% . Myoview - no ischemia. 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 RV normal. Minimal LGE -> doubt significant cardiac sarcoid - Echo 12/20 EF ~45% MV appears thickened and possible rheumatic with 3+ MR. Severe LAE. Mod TR.  - TEE 8/21: LVEF 40-45% w/ mild global HK moderate to severe functional central MR. - Echo 07/18/20 EF 35-40% Severe MR - At baseline NYHA class II symptoms - Developed cardiogenic shock on 11/19 with co-ox 42% - Now on DBA 5 and NE 5. Wean NE - CVP 12. Give lasix 80IV x 1 - Holding GDMT with arrest and shock   3.  Acute hypoxic respiratory failure in setting of #1 - has woken up.  - weaning vent now. Can likely extubate today - CCM managing. D/w Dr. Lynetta Mare at bedside  4. MAT - Zio in 9/20       1. Sinus rhythm - avg HR of 82      2. 22 Supraventricular Tachycardia runs occurred, the run with the fastest interval lasting 5 beats with a max rate of 184 bpm, the longest lasting 10.9 secs with an avg rate of 162 bpm.     3. Isolated PACs were occasional (4.7%, 18936)     4. Rare PVCs (<1.0%) - currently in NSR  5. 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 - She had been referred for possible [employer]. Did not keep appointment last fall due to gap in insurance coverage - Severe MR on echo.   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 Loanne Drilling in Pulmonary. Sarcoid not felt to be active previously - Plan as above. Agree with empiric steroids - Plan cMRI and PET   7. Hypokalemia/hypomagnesemia -  K 3.4 Continue to supp aggressively as above  8. AKI due to ATN/shock - baseline SCr  0.9 - now stabilized at 2.0.  CRITICAL CARE Performed by: Glori Bickers  Total critical care time: 40 minutes  Critical care time was exclusive of separately billable procedures and treating other patients.  Critical care was necessary to treat or prevent imminent or life-threatening deterioration.  Critical care was time spent personally by me (independent of midlevel providers or residents) on the following activities: development of treatment plan with patient and/or surrogate as well as nursing, discussions with consultants, evaluation of patient's response to treatment, examination of patient, obtaining history from patient or surrogate, ordering and performing treatments and interventions, ordering and review of laboratory studies, ordering and review of radiographic studies, pulse oximetry and re-evaluation of patient's condition.   Length  of Stay: 2   Glori Bickers MD 07/20/2021, 10:03 AM  Advanced Heart Failure Team Pager [phone] (M-F; 7a - 4p)  Please contact [facility] for night-coverage after hours (4p -7a ) and weekends on [email or web address]

## 2021-07-20 NOTE — Procedures (Signed)
Extubation Procedure Note  Patient Details:   Name: Robin Estes DOB: 04/07/1946 MRN: [id number]   Airway Documentation:    Vent end date: 07/20/21 Vent end time: 0917   Evaluation  O2 sats: stable throughout Complications: No apparent complications Patient did tolerate procedure well. Bilateral Breath Sounds: Diminished   Pt extubated to 3L [place] per MD order. Pt had positive cuff leak prior to extubation. No stridor noted. Pt able to voice her name.  Guss Bunde 07/20/2021, 9:18 AM

## 2021-07-20 NOTE — Progress Notes (Signed)
eLink Physician-Brief Progress Note Patient Name: Maliah Pyles DOB: 1946-02-28 MRN: [id number]   Date of Service  07/20/2021  HPI/Events of Note  Hypokalemia - K+ 3.4 and Creatinine = 2.04.  eICU Interventions  Will replace K+.         Shawne Bulow [place] 07/20/2021, 4:09 AM

## 2021-07-20 NOTE — Progress Notes (Signed)
NAME:  Robin Estes, MRN:  [id number], DOB:  03/09/1946, LOS: 2 ADMISSION DATE:  07/17/2021, CONSULTATION DATE:  11/18 REFERRING MD:  Dr. Wyvonnia Dusky, CHIEF COMPLAINT:  VF arrest   History of Present Illness:  75 year old female with PMH as below, which is significant for sarcoidosis (pulmonary, eye, skin) biopsy proven, MAT, HTN, and NICM with LVEF 40-45%. Not felt to have significant cardiac sarcoid involvement at last OV. Presented to [facility] ED 11/18 after suffering a syncopal event at home. While in the waiting room she unfortunately suffered a VF cardiac arrest. Arrest was relatively brief (approximately 5 mins of CPR) until she was ultimately defibrillated into ROSC. Laboratory evaluation significant for K 2.7 and mag 1.6. Evaluated by cardiology in the ED who felt the electrolyte abnormalities were the likely etiology of the arrest. The patient was of course intubated as a result of the cardiac arrest, and PCCM was asked to admit. Post arrest she has been making purposeful movement per ED staff.   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   Significant Hospital Events: Including procedures, antibiotic start and stop dates in addition to other pertinent events   11/18 admit s/p VF arrest.  With multiple episodes of V. fib. 07/18/2021 multiple  episodes of V. fib arrest now with lidocaine plus amiodarone infusion 11/19 less responsive today 11/20 now awake and moving purposefully and following commands.  11/20 vasopressor requirements have improved  Objective   Blood pressure (!) 115/56, pulse 77, temperature 97.9 F (36.6 C), resp. rate (!) 27, height 5\' 4"  (1.626 m), weight 63 kg, SpO2 100 %. CVP:  [17 mmHg] 17 mmHg  Vent Mode: PRVC FiO2 (%):  [30 %] 30 % Set Rate:  [12 bmp] 12 bmp Vt Set:  [460 mL] 460 mL PEEP:  [5 cmH20] 5 cmH20 Pressure Support:  [14 cmH20-15 cmH20] 15 cmH20 Plateau Pressure:  [16 cmH20-20 cmH20] 19 cmH20   Intake/Output Summary (Last 24 hours) at 07/20/2021 0736 Last data filed at 07/20/2021 0700 Gross per 24 hour  Intake 4034.97 ml  Output 435 ml  Net 3599.97 ml    Filed Weights   07/18/21 1526 07/19/21 0500 07/20/21 0500  Weight: 63 kg 63 kg 63 kg    Examination: General: Elderly female, frail, intubated.  Off all sedation HEENT: Endotracheal tube and gastric tube are in place.  Alopecia  Neuro: Tracks to voice, follows commands, agitated and moves all limbs.  CV: Extremities warm, HR 81, HS normal.  PULM: Chest clear to auscultation. Tolerating 10/5 SBT GI: soft, bsx4 active  GU: Clear urine.  Extremities: warm/dry, 1+ edema  Skin: n left scalp indications of sarcoid   Ancillary tests personally reviewed  SCV O2 77%  Sodium has improved to 130. Creatinine has peaked and decreased to 2.04 CT head - normal   Assessment & Plan:   Critically ill due to VF cardiac arrest requiring titration of amiodarone due to nonischemic cardiomyopathy Critically ill due to cardiogenic shock quiring titration of norepinephrine and dobutamine.  Critically ill due to acute hypoxic hypercarbic respiratory failure require mechanical ventilation Possible hypoxic ischemic encephalopathy due to cardiac arrest with possible myoclonus HFrEF: LVEF 40-45% MAT Sarcoidosis with possible cardiac  involvement  Plan:  -30 min SBT and possible extubation.  -Wean norepinephrine preferentially to keep MAP greater than 65 -Titrate dobutamine to keep SCV O2 greater than 60 -Improving cardiac output has  improved cerebral and renal perfusion  Best Practice (right click and "Reselect all SmartList Selections" daily)   Diet/type: NPO tube feeds on hold for extubaiton.  DVT prophylaxis: LMWH GI prophylaxis: PPI Lines: N/A Foley:  Yes, and it is still needed Code Status:  full code Last date of multidisciplinary goals of care discussion: Daughter updated by myself 11/20  - outlook is much better today   Labs   CBC: Recent Labs  Lab 07/17/21 2216 07/17/21 2227 07/18/21 0229 07/18/21 0248 07/18/21 0450 07/18/21 0623 07/18/21 1114 07/19/21 0541 07/20/21 0636  WBC 4.3  --  7.6  --   --   --   --  9.7  --   NEUTROABS 3.2  --  4.9  --   --   --   --  8.7*  --   HGB 9.4*   < > 9.7*   < > 8.5* 8.8* 10.2* 9.7* 9.9*  HCT 30.4*   < > 31.0*   < > 25.0* 26.0* 30.0* 31.4* 29.0*  MCV 94.7  --  96.0  --   --   --   --  94.9  --   PLT 320  --  376  --   --   --   --  367  --    < > = values in this interval not displayed.     Basic Metabolic Panel: Recent Labs  Lab 07/18/21 1343 07/18/21 2204 07/19/21 0541 07/19/21 1039 07/19/21 1911 07/20/21 0315 07/20/21 0636  NA 134*  --  126* 124* 124* 123* 130*  K 3.3*  --  5.1 4.2 4.1 3.4* 3.4*  CL 102  --  96* 94* 95* 94*  --   CO2 25  --  19* 19* 19* 20*  --   GLUCOSE 288*  --  445* 476* 439* 352*  --   BUN 7*  --  12 12 15 16   --   CREATININE 0.90  --  1.64* 1.89* 2.12* 2.04*  --   CALCIUM 8.0*  --  7.5* 7.6* 7.7* 7.5*  --   MG 2.6* 2.6* 2.4 2.4 2.2  --   --   PHOS 3.2 3.5 4.6  --  3.9  --   --     GFR: Estimated Creatinine Clearance: 20.3 mL/min (A) (by C-G formula based on SCr of 2.04 mg/dL (H)). Recent Labs  Lab 07/17/21 2216 07/18/21 0229 07/18/21 0429 07/19/21 0541 07/19/21 1425  WBC 4.3 7.6  --  9.7  --    LATICACIDVEN  --  5.4* 4.7*  --  5.4*     Liver Function Tests: Recent Labs  Lab 07/17/21 2216 07/18/21 0229  AST 41 56*  ALT 21 25  ALKPHOS 123 127*  BILITOT 1.3* 1.4*  PROT 6.3* 5.8*  ALBUMIN 2.9* 2.7*    No results for input(s): LIPASE, AMYLASE in the last 168 hours. No results for input(s): AMMONIA in the last 168 hours.  ABG    Component Value Date/Time   PHART 7.404 07/20/2021 0636   PCO2ART 34.0 07/20/2021 0636   PO2ART 127 (H) 07/20/2021 0636  HCO3 21.3 07/20/2021 0636   TCO2 22 07/20/2021 0636   ACIDBASEDEF 3.0 (H) 07/20/2021 0636   O2SAT 99.0 07/20/2021 0636      Coagulation Profile: No results for input(s): INR, PROTIME in the last 168 hours.  Cardiac Enzymes: No results for input(s): CKTOTAL, CKMB, CKMBINDEX, TROPONINI in the last 168 hours.  HbA1C: Hgb A1c MFr Bld  Date/Time Value Ref Range Status  03/16/2019 02:25 AM 5.6 4.8 - 5.6 % Final    Comment:    (NOTE) Pre diabetes:          5.7%-6.4% Diabetes:              >6.4% Glycemic control for   <7.0% adults with diabetes     CBG: Recent Labs  Lab 07/19/21 1117 07/19/21 1607 07/19/21 1911 07/19/21 2326 07/20/21 0339  GLUCAP 193* 194* 199* 169* 160*    CRITICAL CARE Performed by: Lynnell Catalan   Total critical care time: 40 minutes  Critical care time was exclusive of separately billable procedures and treating other patients.  Critical care was necessary to treat or prevent imminent or life-threatening deterioration.  Critical care was time spent personally by me on the following activities: development of treatment plan with patient and/or surrogate as well as nursing, discussions with consultants, evaluation of patient's response to treatment, examination of patient, obtaining history from patient or surrogate, ordering and performing treatments and interventions, ordering and review of laboratory studies, ordering and review of radiographic studies, pulse oximetry,  re-evaluation of patient's condition and participation in multidisciplinary rounds.  Lynnell Catalan, MD [facility] ICU Physician [facility] [place] [facility]  Pager: [phone] Mobile: [phone] After hours: [phone].  07/20/2021, 7:36 AM

## 2021-07-21 LAB — URINALYSIS, ROUTINE W REFLEX MICROSCOPIC
Bilirubin Urine: NEGATIVE
Glucose, UA: NEGATIVE mg/dL
Ketones, ur: NEGATIVE mg/dL
Nitrite: NEGATIVE
Protein, ur: NEGATIVE mg/dL
Specific Gravity, Urine: 1.008 (ref 1.005–1.030)
pH: 5 (ref 5.0–8.0)

## 2021-07-21 LAB — COOXEMETRY PANEL
Carboxyhemoglobin: 1.1 % (ref 0.5–1.5)
Methemoglobin: 1 % (ref 0.0–1.5)
O2 Saturation: 75.4 %
Total hemoglobin: 9.5 g/dL — ABNORMAL LOW (ref 12.0–16.0)

## 2021-07-21 LAB — BASIC METABOLIC PANEL
Anion gap: 7 (ref 5–15)
BUN: 19 mg/dL (ref 8–23)
CO2: 24 mmol/L (ref 22–32)
Calcium: 7.6 mg/dL — ABNORMAL LOW (ref 8.9–10.3)
Chloride: 97 mmol/L — ABNORMAL LOW (ref 98–111)
Creatinine, Ser: 1.53 mg/dL — ABNORMAL HIGH (ref 0.44–1.00)
GFR, Estimated: 35 mL/min — ABNORMAL LOW (ref >60)
Glucose, Bld: 287 mg/dL — ABNORMAL HIGH (ref 70–99)
Potassium: 3.7 mmol/L (ref 3.5–5.1)
Sodium: 128 mmol/L — ABNORMAL LOW (ref 135–145)

## 2021-07-21 LAB — CBC
HCT: 27.3 % — ABNORMAL LOW (ref 36.0–46.0)
Hemoglobin: 8.7 g/dL — ABNORMAL LOW (ref 12.0–15.0)
MCH: 29.2 pg (ref 26.0–34.0)
MCHC: 31.9 g/dL (ref 30.0–36.0)
MCV: 91.6 fL (ref 80.0–100.0)
Platelets: UNDETERMINED 10*3/uL (ref 150–400)
RBC: 2.98 MIL/uL — ABNORMAL LOW (ref 3.87–5.11)
RDW: 15.3 % (ref 11.5–15.5)
WBC: 13.2 10*3/uL — ABNORMAL HIGH (ref 4.0–10.5)
nRBC: 0.2 % (ref 0.0–0.2)

## 2021-07-21 LAB — GLUCOSE, CAPILLARY
Glucose-Capillary: 117 mg/dL — ABNORMAL HIGH (ref 70–99)
Glucose-Capillary: 122 mg/dL — ABNORMAL HIGH (ref 70–99)
Glucose-Capillary: 151 mg/dL — ABNORMAL HIGH (ref 70–99)
Glucose-Capillary: 161 mg/dL — ABNORMAL HIGH (ref 70–99)
Glucose-Capillary: 165 mg/dL — ABNORMAL HIGH (ref 70–99)
Glucose-Capillary: 185 mg/dL — ABNORMAL HIGH (ref 70–99)

## 2021-07-21 LAB — HEMOGLOBIN A1C
Hgb A1c MFr Bld: 5.8 % — ABNORMAL HIGH (ref 4.8–5.6)
Mean Plasma Glucose: 119.76 mg/dL

## 2021-07-21 LAB — MRSA NEXT GEN BY PCR, NASAL: MRSA by PCR Next Gen: NOT DETECTED

## 2021-07-21 LAB — MAGNESIUM: Magnesium: 1.9 mg/dL (ref 1.7–2.4)

## 2021-07-21 MED ORDER — POTASSIUM CHLORIDE 20 MEQ PO PACK
40.0000 meq | PACK | Freq: Once | ORAL | Status: AC
  Administered 2021-07-21: 40 meq via ORAL
  Filled 2021-07-21: qty 2

## 2021-07-21 MED ORDER — INSULIN ASPART 100 UNIT/ML IJ SOLN: 0.0000 [IU] | Freq: Every day | INTRAMUSCULAR | Status: DC

## 2021-07-21 MED ORDER — INSULIN ASPART 100 UNIT/ML IJ SOLN: 0.0000 [IU] | Freq: Three times a day (TID) | INTRAMUSCULAR | Status: DC

## 2021-07-21 MED ORDER — MAGNESIUM SULFATE 2 GM/50ML IV SOLN
2.0000 g | Freq: Once | INTRAVENOUS | Status: AC
  Administered 2021-07-21: 2 g via INTRAVENOUS
  Filled 2021-07-21: qty 50

## 2021-07-21 MED ORDER — MIDODRINE HCL 5 MG PO TABS
5.0000 mg | ORAL_TABLET | Freq: Three times a day (TID) | ORAL | Status: DC
  Administered 2021-07-21 – 2021-07-22 (×5): 5 mg via ORAL
  Filled 2021-07-21 (×5): qty 1

## 2021-07-21 MED ORDER — NOREPINEPHRINE 16 MG/250ML-% IV SOLN
0.0000 ug/min | INTRAVENOUS | Status: DC
  Administered 2021-07-21: 14:00:00 2 ug/min via INTRAVENOUS
  Filled 2021-07-21 (×2): qty 250

## 2021-07-21 MED ORDER — SODIUM CHLORIDE 0.9 % IV SOLN
1.0000 g | INTRAVENOUS | Status: AC
  Administered 2021-07-21 – 2021-07-27 (×7): 1 g via INTRAVENOUS
  Filled 2021-07-21 (×7): qty 10

## 2021-07-21 MED ORDER — INSULIN ASPART 100 UNIT/ML IJ SOLN
0.0000 [IU] | Freq: Three times a day (TID) | INTRAMUSCULAR | Status: DC
  Administered 2021-07-21: 3 [IU] via SUBCUTANEOUS
  Administered 2021-07-22: 08:00:00 1 [IU] via SUBCUTANEOUS
  Administered 2021-07-23 – 2021-07-24 (×3): 2 [IU] via SUBCUTANEOUS

## 2021-07-21 MED ORDER — DOBUTAMINE IN D5W 4-5 MG/ML-% IV SOLN
2.5000 ug/kg/min | INTRAVENOUS | Status: DC
  Administered 2021-07-21 (×2): 5 ug/kg/min via INTRAVENOUS
  Filled 2021-07-21: qty 250

## 2021-07-21 MED ORDER — ENSURE ENLIVE PO LIQD
237.0000 mL | Freq: Two times a day (BID) | ORAL | Status: DC
  Administered 2021-07-21 – 2021-07-23 (×3): 237 mL via ORAL

## 2021-07-21 NOTE — Progress Notes (Addendum)
NAME:  Robin Estes, MRN:  [id number], DOB:  [birth year], LOS: 3 ADMISSION DATE:  07/17/2021, CONSULTATION DATE:  11/18 REFERRING MD:  Dr. Wyvonnia Dusky, CHIEF COMPLAINT:  VF arrest   History of Present Illness:  75 year old female with PMH as below, which is significant for sarcoidosis (pulmonary, eye, skin) biopsy proven, MAT, HTN, and NICM with LVEF 40-45%. Not felt to have significant cardiac sarcoid involvement at last OV. Presented to [facility] ED 11/18 after suffering a syncopal event at home. While in the waiting room she unfortunately suffered a VF cardiac arrest. Arrest was relatively brief (approximately 5 mins of CPR) until she was ultimately defibrillated into ROSC. Laboratory evaluation significant for K 2.7 and mag 1.6. Evaluated by cardiology in the ED who felt the electrolyte abnormalities were the likely etiology of the arrest. The patient was of course intubated as a result of the cardiac arrest, and PCCM was asked to admit. Post arrest she has been making purposeful movement per ED staff.   Pertinent  Medical History   has a past medical history of Bladder spasms, Cardiomegaly (03/15/2019), CHF (congestive heart failure) ([place]), Chronic interstitial cystitis, Combined systolic and diastolic heart failure ([place]), Dysuria, Elevated troponin (03/15/2019), End-stage glaucoma, Frequency of urination, Legally blind in right eye, as defined in [place], Lymphadenopathy, hilar (03/15/2019), Multifocal atrial tachycardia ([place]), Nocturia, Pleural effusion (03/15/2019), Prolonged QT interval (03/15/2019), Pulmonary sarcoidosis ([place]) (07/09/2019), Sarcoidosis (03/15/2019), Sarcoidosis of skin, Sensation of pressure in bladder area, Solitary pulmonary nodule (07/09/2019), and Urgency of urination.   Significant Hospital Events: Including procedures, antibiotic start and stop dates in addition to other pertinent events   11/18 admit s/p VF arrest.  With multiple episodes of V. fib. 07/18/2021 multiple  episodes of V. fib arrest now with lidocaine plus amiodarone infusion 11/19 less responsive today 11/20 now awake and moving purposefully and following commands.  11/20 vasopressor requirements have improved, successfully extubated.  11/21 remains on NE and dobutamine  Subjective  Up to chair today. Denies SOB   Objective   Blood pressure (!) 120/46, pulse 89, temperature 97.8 F (36.6 C), temperature source Axillary, resp. rate (!) 28, height 5\' 4"  (1.626 m), weight 63 kg, SpO2 97 %. CVP:  [11 mmHg] 11 mmHg  Vent Mode: PSV;CPAP FiO2 (%):  [30 %] 30 % PEEP:  [5 cmH20] 5 cmH20 Pressure Support:  [10 cmH20] 10 cmH20   Intake/Output Summary (Last 24 hours) at 07/21/2021 0718 Last data filed at 07/21/2021 0600 Gross per 24 hour  Intake 1842.99 ml  Output 1225 ml  Net 617.99 ml    Filed Weights   07/18/21 1526 07/19/21 0500 07/20/21 0500  Weight: 63 kg 63 kg 63 kg    Examination: General: Elderly female, frail, in chair HEENT:  Alopecia  Neuro: Awake and follows commands CV: Extremities warm, but capillary refill is decreased. JVP at 2cm with no HJR PULM: Bronchial at right base with occasional wheezes GI: soft, bsx4 active  GU: Foley out  Extremities: warm/dry, 1+ edema  Skin: n left scalp indications of sarcoid   Ancillary tests personally reviewed  SCV O2 75%  Sodium has improved to 128. Creatinine has peaked and decreased to 1.53 CT head - normal   Assessment & Plan:   Critically ill due to VF cardiac arrest requiring titration of amiodarone due to nonischemic cardiomyopathy Critically ill due to cardiogenic shock quiring titration of norepinephrine and dobutamine.  Critically ill due to acute hypoxic hypercarbic respiratory failure require mechanical ventilation Possible  hypoxic ischemic encephalopathy due to cardiac arrest with possible myoclonus HFrEF: LVEF 40-45% MAT Sarcoidosis with possible cardiac involvement  Plan:  - Wean off infusions starting to  keep MAP> 65 and ScvO2 >60%. Dropped dobutamine to 5.  - Hold on further diuretics.  - Progressive ambulation and incentive spirometry - Transition to oral amiodarone.   Best Practice (right click and "Reselect all SmartList Selections" daily)   Diet/type: Regular diet.  DVT prophylaxis: LMWH GI prophylaxis: PPI Lines: N/A Foley:  Yes, and it is still needed Code Status:  full code Last date of multidisciplinary goals of care discussion: Daughter updated by myself 11/21   Labs   CBC: Recent Labs  Lab 07/17/21 2216 07/17/21 2227 07/18/21 0229 07/18/21 0248 07/18/21 0450 07/18/21 0623 07/18/21 1114 07/19/21 0541 07/20/21 0636  WBC 4.3  --  7.6  --   --   --   --  9.7  --   NEUTROABS 3.2  --  4.9  --   --   --   --  8.7*  --   HGB 9.4*   < > 9.7*   < > 8.5* 8.8* 10.2* 9.7* 9.9*  HCT 30.4*   < > 31.0*   < > 25.0* 26.0* 30.0* 31.4* 29.0*  MCV 94.7  --  96.0  --   --   --   --  94.9  --   PLT 320  --  376  --   --   --   --  367  --    < > = values in this interval not displayed.     Basic Metabolic Panel: Recent Labs  Lab 07/18/21 1343 07/18/21 2204 07/19/21 0541 07/19/21 1039 07/19/21 1911 07/20/21 0315 07/20/21 0600 07/20/21 0636 07/21/21 0456  NA 134*  --  126* 124* 124* 123* 128* 130* 128*  K 3.3*  --  5.1 4.2 4.1 3.4* 3.5 3.4* 3.7  CL 102  --  96* 94* 95* 94* 98  --  97*  CO2 25  --  19* 19* 19* 20* 22  --  24  GLUCOSE 288*  --  445* 476* 439* 352* 234*  --  287*  BUN 7*  --  12 12 15 16 18   --  19  CREATININE 0.90  --  1.64* 1.89* 2.12* 2.04* 2.00*  --  1.53*  CALCIUM 8.0*  --  7.5* 7.6* 7.7* 7.5* 7.7*  --  7.6*  MG 2.6* 2.6* 2.4 2.4 2.2  --   --   --  1.9  PHOS 3.2 3.5 4.6  --  3.9  --   --   --   --     GFR: Estimated Creatinine Clearance: 27 mL/min (A) (by C-G formula based on SCr of 1.53 mg/dL (H)). Recent Labs  Lab 07/17/21 2216 07/18/21 0229 07/18/21 0429 07/19/21 0541 07/19/21 1425  WBC 4.3 7.6  --  9.7  --   LATICACIDVEN  --  5.4* 4.7*   --  5.4*     Liver Function Tests: Recent Labs  Lab 07/17/21 2216 07/18/21 0229  AST 41 56*  ALT 21 25  ALKPHOS 123 127*  BILITOT 1.3* 1.4*  PROT 6.3* 5.8*  ALBUMIN 2.9* 2.7*    No results for input(s): LIPASE, AMYLASE in the last 168 hours. No results for input(s): AMMONIA in the last 168 hours.  ABG    Component Value Date/Time   PHART 7.404 07/20/2021 0636   PCO2ART 34.0 07/20/2021 0636   PO2ART  127 (H) 07/20/2021 0636   HCO3 21.3 07/20/2021 0636   TCO2 22 07/20/2021 0636   ACIDBASEDEF 3.0 (H) 07/20/2021 0636   O2SAT 75.4 07/21/2021 0456      Coagulation Profile: No results for input(s): INR, PROTIME in the last 168 hours.  Cardiac Enzymes: No results for input(s): CKTOTAL, CKMB, CKMBINDEX, TROPONINI in the last 168 hours.  HbA1C: Hgb A1c MFr Bld  Date/Time Value Ref Range Status  03/16/2019 02:25 AM 5.6 4.8 - 5.6 % Final    Comment:    (NOTE) Pre diabetes:          5.7%-6.4% Diabetes:              >6.4% Glycemic control for   <7.0% adults with diabetes     CBG: Recent Labs  Lab 07/20/21 1058 07/20/21 1630 07/20/21 1949 07/21/21 0003 07/21/21 0359  GLUCAP 113* 118* 117* 165* 151*    CRITICAL CARE Performed by: Lynnell Catalan   Total critical care time: 35 minutes  Critical care time was exclusive of separately billable procedures and treating other patients.  Critical care was necessary to treat or prevent imminent or life-threatening deterioration.  Critical care was time spent personally by me on the following activities: development of treatment plan with patient and/or surrogate as well as nursing, discussions with consultants, evaluation of patient's response to treatment, examination of patient, obtaining history from patient or surrogate, ordering and performing treatments and interventions, ordering and review of laboratory studies, ordering and review of radiographic studies, pulse oximetry, re-evaluation of patient's condition and  participation in multidisciplinary rounds.  Lynnell Catalan, MD [facility] ICU Physician [facility] [place] Critical Care  Pager: [phone] Mobile: [phone] After hours: [phone].   07/21/2021, 7:18 AM

## 2021-07-21 NOTE — Progress Notes (Signed)
Nutrition Follow-up  DOCUMENTATION CODES:   Non-severe (moderate) malnutrition in context of chronic illness  INTERVENTION:   Ensure Enlive po BID, each supplement provides 350 kcal and 20 grams of protein  Diet advancement as tolerated   NUTRITION DIAGNOSIS:   Moderate Malnutrition related to chronic illness as evidenced by mild fat depletion, mild muscle depletion.  Being addressed via supplements  GOAL:   Patient will meet greater than or equal to 90% of their needs  Progrsesing  MONITOR:   TF tolerance, Vent status, Labs, Weight trends  REASON FOR ASSESSMENT:   Consult, Ventilator    ASSESSMENT:   75 yo female admitted post VF arrest secondary to electrolyte abnormalities, possible cardiac sarcoidosis, intubated. PMH includes sarcoidosis (pulmonary, eye, skin), HTN, NICM with LVEF 40-45%  11/18 VF arrest 11/20 Extubated  OOB to chair today  Remains on levophed and dobutamine  Tolerating FL diet but appetite poor; recorded po intake 25% of meals  Hyponatremia; net + 7 L per I/O flow sheet but I&O inaccurate per MD notes  Labs:sodium 128 (L) Meds: ss novolog  Diet Order:   Diet Order             Diet full liquid Room service appropriate? Yes with Assist; Fluid consistency: Thin  Diet effective now                   EDUCATION NEEDS:   Not appropriate for education at this time  Skin:  Skin Assessment: Reviewed RN Assessment  Last BM:  11/20  Height:   Ht Readings from Last 1 Encounters:  07/18/21 5\' 4"  (1.626 m)    Weight:   Wt Readings from Last 1 Encounters:  07/20/21 63 kg     BMI:  Body mass index is 23.84 kg/m.  Estimated Nutritional Needs:   Kcal:  1500-1700 kcals  Protein:  90-115 g  Fluid:  >/= 1.5 L  07/22/21 MS, RDN, LDN, CNSC Registered Dietitian III Clinical Nutrition RD Pager and On-Call Pager Number Located in [place]

## 2021-07-21 NOTE — Progress Notes (Addendum)
Advanced Heart Failure Rounding Note   Subjective:    Echo 07/18/20 EF 35-40% Severe MR 11/19 Head CT negative for acute process.  11/20 Extubated to 3 liters [place].   SCr stabilizing ~1.5   Yesterday given IV lasix x1. I& O not accurate. CVP today 2.   Overnight hypotensive norepi increased to 16 mcg and dobutamine increased to 7.5 mcg. CO-OX 75%.   Remains on amio drip at 30 mg per hour.   Says she doesn't feel good. Denies SOB.   Objective:   Weight Range:  Vital Signs:   Temp:  [97.5 F (36.4 C)-98.4 F (36.9 C)] 97.8 F (36.6 C) (11/21 0401) Pulse Rate:  [69-102] 89 (11/21 0615) Resp:  [13-34] 28 (11/21 0615) BP: (84-132)/(40-66) 120/46 (11/21 0615) SpO2:  [92 %-100 %] 97 % (11/21 0615) FiO2 (%):  [30 %] 30 % (11/20 0735) Last BM Date: 07/20/21  Weight change: Filed Weights   07/18/21 1526 07/19/21 0500 07/20/21 0500  Weight: 63 kg 63 kg 63 kg    Intake/Output:   Intake/Output Summary (Last 24 hours) at 07/21/2021 0655 Last data filed at 07/21/2021 0600 Gross per 24 hour  Intake 1921.11 ml  Output 1225 ml  Net 696.11 ml    CVP 2 personally checked in the chair.  Physical Exam: General:  In the chair. No resp difficulty HEENT: normal Neck: supple. no JVD. Carotids 2+ bilat; no bruits. No lymphadenopathy or thryomegaly appreciated. Cor: PMI nondisplaced. Regular rate & rhythm. No rubs, gallops or murmurs. Lungs: LLL/RLL crackles on room air.  Abdomen: soft, nontender, nondistended. No hepatosplenomegaly. No bruits or masses. Good bowel sounds. Extremities: no cyanosis, clubbing, rash, edema. RUE PICC  Neuro: alert & orientedx3, cranial nerves grossly intact. moves all 4 extremities w/o difficulty. Affect flat     Telemetry: SR 70-80s personally checked.   Labs: Basic Metabolic Panel: Recent Labs  Lab 07/18/21 1343 07/18/21 2204 07/19/21 0541 07/19/21 1039 07/19/21 1911 07/20/21 0315 07/20/21 0600 07/20/21 0636 07/21/21 0456  NA 134*   --  126* 124* 124* 123* 128* 130* 128*  K 3.3*  --  5.1 4.2 4.1 3.4* 3.5 3.4* 3.7  CL 102  --  96* 94* 95* 94* 98  --  97*  CO2 25  --  19* 19* 19* 20* 22  --  24  GLUCOSE 288*  --  445* 476* 439* 352* 234*  --  287*  BUN 7*  --  12 12 15 16 18   --  19  CREATININE 0.90  --  1.64* 1.89* 2.12* 2.04* 2.00*  --  1.53*  CALCIUM 8.0*  --  7.5* 7.6* 7.7* 7.5* 7.7*  --  7.6*  MG 2.6* 2.6* 2.4 2.4 2.2  --   --   --  1.9  PHOS 3.2 3.5 4.6  --  3.9  --   --   --   --     Liver Function Tests: Recent Labs  Lab 07/17/21 2216 07/18/21 0229  AST 41 56*  ALT 21 25  ALKPHOS 123 127*  BILITOT 1.3* 1.4*  PROT 6.3* 5.8*  ALBUMIN 2.9* 2.7*   No results for input(s): LIPASE, AMYLASE in the last 168 hours. No results for input(s): AMMONIA in the last 168 hours.  CBC: Recent Labs  Lab 07/17/21 2216 07/17/21 2227 07/18/21 0229 07/18/21 0248 07/18/21 0450 07/18/21 0623 07/18/21 1114 07/19/21 0541 07/20/21 0636  WBC 4.3  --  7.6  --   --   --   --  9.7  --   NEUTROABS 3.2  --  4.9  --   --   --   --  8.7*  --   HGB 9.4*   < > 9.7*   < > 8.5* 8.8* 10.2* 9.7* 9.9*  HCT 30.4*   < > 31.0*   < > 25.0* 26.0* 30.0* 31.4* 29.0*  MCV 94.7  --  96.0  --   --   --   --  94.9  --   PLT 320  --  376  --   --   --   --  367  --    < > = values in this interval not displayed.    Cardiac Enzymes: No results for input(s): CKTOTAL, CKMB, CKMBINDEX, TROPONINI in the last 168 hours.  BNP: BNP (last 3 results) Recent Labs    04/18/21 1340 07/18/21 0230  BNP 124.9* 893.2*    ProBNP (last 3 results) No results for input(s): PROBNP in the last 8760 hours.    Other results:  Imaging: CT HEAD WO CONTRAST (5MM)  Result Date: 07/19/2021 CLINICAL DATA:  Possible anoxic brain injury EXAM: CT HEAD WITHOUT CONTRAST TECHNIQUE: Contiguous axial images were obtained from the base of the skull through the vertex without intravenous contrast. COMPARISON:  07/18/2021 FINDINGS: Brain: Mild atrophic changes are  again identified. No findings to suggest acute hemorrhage, acute infarction or space-occupying mass lesion are noted. Vascular: No hyperdense vessel or unexpected calcification. Skull: Normal. Negative for fracture or focal lesion. Sinuses/Orbits: No acute finding. Other: None. IMPRESSION: Chronic atrophic changes stable from the previous day. No acute abnormality noted. Electronically Signed   By: Inez Catalina M.D.   On: 07/19/2021 21:05     Medications:     Scheduled Medications:  chlorhexidine  15 mL Mouth Rinse BID   Chlorhexidine Gluconate Cloth  6 each Topical Daily   heparin  5,000 Units Subcutaneous Q8H   mouth rinse  15 mL Mouth Rinse q12n4p   sodium chloride flush  10-40 mL Intracatheter Q12H    Infusions:  amiodarone 30 mg/hr (07/21/21 0600)   DOBUTamine 10 mcg/kg/min (07/21/21 0600)   famotidine (PEPCID) IV Stopped (07/20/21 0936)   feeding supplement (VITAL AF 1.2 CAL) 1,000 mL (07/19/21 1218)   fentaNYL infusion INTRAVENOUS Stopped (07/19/21 0602)   levETIRAcetam Stopped (07/21/21 0508)   magnesium sulfate bolus IVPB     norepinephrine (LEVOPHED) Adult infusion 16 mcg/min (07/21/21 0600)    PRN Medications: docusate, fentaNYL, midazolam, ondansetron (ZOFRAN) IV, polyethylene glycol, sodium chloride flush   Assessment/Plan:   1. 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 Lido now off - Keep K > 4.0 and Mg > 2.0- Given 2 grams and replacing K.  - Will need cath to make sure no high-grade ischemia (have to wait un ATN recovers) - set up cath tomorrow. Creatinine 1.5 today.  - set up for cMRI.  - Continue with empiric steroids   2. Chronic Systolic Heart Failure -> cardiogenic shock  - ECHO 03/2019 EF 30-35% . Myoview - no ischemia. 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 RV normal.  Minimal LGE -> doubt significant cardiac sarcoid - Echo 12/20 EF ~45% MV appears thickened and possible rheumatic with 3+ MR. Severe LAE. Mod TR.  - TEE 8/21: LVEF 40-45% w/ mild global HK  moderate to severe functional central MR. - Echo 07/18/20 EF 35-40% Severe MR - At baseline NYHA class II symptoms?   - Developed cardiogenic shock on 11/19 with co-ox 42% - CVP 2. No diuretics. Dobutamine and Norepi increased overnight. Will  need place fixed dobutamine rate at 5 and start to wean norepi.  - Add 5 mg midodrine tid.  - Holding GDMT with arrest and shock - Set up cath tomorrow.    3. Acute hypoxic respiratory failure in setting of #1 - Extubated 07/20/21 to 3 liters. has woken up.  - Stable on room air.   4. MAT - Zio in 9/20       1. Sinus rhythm - avg HR of 82      2. 22 Supraventricular Tachycardia runs occurred, the run with the fastest interval lasting 5 beats with a max rate of 184 bpm, the longest lasting 10.9 secs with an avg rate of 162 bpm.     3. Isolated PACs were occasional (4.7%, 18936)     4. Rare PVCs (<1.0%) - currently in NSR  5. 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 - She had been referred for possible [employer]. Did not keep appointment last fall due to gap in insurance coverage - Severe MR on echo.   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 Loanne Drilling in Pulmonary. Sarcoid not felt to be active previously - Plan as above.  - Completed steroids.  - Plan cMRI and PET   7. Hypokalemia/hypomagnesemia - As above replace K and Mag.   8. AKI due to ATN/shock - baseline SCr  0.9 - Trending down to 1.53 today.   9. Hyperglycemia - likely steroid-induced  Consult PT.  Set MRI/LHC soon.   Length of Stay: 3   Amy Clegg NP-C  07/21/2021, 6:55 AM  Advanced Heart Failure Team Pager [phone] (M-F; 7a - 4p)  Please contact [facility] for night-coverage after hours (4p -7a ) and weekends  on [email or web address]  Agree with above.   Extubated yesterday. NE and DBA increased overnight. Co-ox 75% No further VT on IV amio.   SCr 2.0 -> 1.5.  CVP down to 2 after lasix yesterday.   General:  Lying in bed  No resp difficulty HEENT: normal sarcoid changes Neck: supple. no JVD. Carotids 2+ bilat; no bruits. No lymphadenopathy or thryomegaly appreciated. Cor: PMI nondisplaced. Regular rate & rhythm. No rubs, gallops or murmurs. Lungs: clear Abdomen: soft, nontender, nondistended. No hepatosplenomegaly. No bruits or masses. Good bowel sounds. Extremities: no cyanosis, clubbing, rash, edema Neuro: alert & orientedx3, cranial nerves grossly intact. moves all 4 extremities w/o difficulty. Affect pleasant  Remains on dual inotropes/pressors for BP and cardiac support. Co-ox ok. Will turn DBA back to 5. Add midodrine to facilitate NE wean. Plan cath/ MRI tomorrow if renal function continues to improve.  Needs insulin coverage for hyperglycemia.   CRITICAL CARE Performed by: Glori Bickers  Total critical care time: 35 minutes  Critical care time was exclusive of separately billable procedures and treating other patients.  Critical care was necessary to treat or prevent imminent or life-threatening deterioration.  Critical care was time spent personally by me (independent of midlevel providers or residents) on the following activities: development of treatment plan with patient and/or surrogate as well as nursing, discussions with consultants, evaluation of patient's response to treatment, examination of patient, obtaining history from patient or surrogate, ordering and performing treatments and interventions,  ordering and review of laboratory studies, ordering and review of radiographic studies, pulse oximetry and re-evaluation of patient's condition.  Arvilla Meres, MD  8:52 AM

## 2021-07-21 NOTE — Progress Notes (Signed)
Mg 1.9 Replaced per protocol  

## 2021-07-22 ENCOUNTER — Encounter (HOSPITAL_COMMUNITY)
Admission: EM | Disposition: A | Discharge status: Home Health | Payer: Self-pay | Source: Home / Self Care | Attending: Internal Medicine

## 2021-07-22 ENCOUNTER — Inpatient Hospital Stay (HOSPITAL_COMMUNITY): Payer: Medicare Other

## 2021-07-22 DIAGNOSIS — I5020 Unspecified systolic (congestive) heart failure: Secondary | ICD-10-CM

## 2021-07-22 DIAGNOSIS — I5022 Chronic systolic (congestive) heart failure: Secondary | ICD-10-CM

## 2021-07-22 HISTORY — PX: RIGHT/LEFT HEART CATH AND CORONARY ANGIOGRAPHY: CATH118266

## 2021-07-22 LAB — POCT I-STAT 7, (LYTES, BLD GAS, ICA,H+H)
Acid-Base Excess: 1 mmol/L (ref 0.0–2.0)
Acid-Base Excess: 1 mmol/L (ref 0.0–2.0)
Acid-Base Excess: 5 mmol/L — ABNORMAL HIGH (ref 0.0–2.0)
Bicarbonate: 26 mmol/L (ref 20.0–28.0)
Bicarbonate: 26.2 mmol/L (ref 20.0–28.0)
Bicarbonate: 29.5 mmol/L — ABNORMAL HIGH (ref 20.0–28.0)
Calcium, Ion: 0.86 mmol/L — CL (ref 1.15–1.40)
Calcium, Ion: 0.9 mmol/L — ABNORMAL LOW (ref 1.15–1.40)
Calcium, Ion: 1.04 mmol/L — ABNORMAL LOW (ref 1.15–1.40)
HCT: 21 % — ABNORMAL LOW (ref 36.0–46.0)
HCT: 22 % — ABNORMAL LOW (ref 36.0–46.0)
HCT: 25 % — ABNORMAL LOW (ref 36.0–46.0)
Hemoglobin: 7.1 g/dL — ABNORMAL LOW (ref 12.0–15.0)
Hemoglobin: 7.5 g/dL — ABNORMAL LOW (ref 12.0–15.0)
Hemoglobin: 8.5 g/dL — ABNORMAL LOW (ref 12.0–15.0)
O2 Saturation: 100 %
O2 Saturation: 82 %
O2 Saturation: 84 %
Potassium: 3.3 mmol/L — ABNORMAL LOW (ref 3.5–5.1)
Potassium: 3.3 mmol/L — ABNORMAL LOW (ref 3.5–5.1)
Potassium: 3.8 mmol/L (ref 3.5–5.1)
Sodium: 140 mmol/L (ref 135–145)
Sodium: 143 mmol/L (ref 135–145)
Sodium: 143 mmol/L (ref 135–145)
TCO2: 27 mmol/L (ref 22–32)
TCO2: 27 mmol/L (ref 22–32)
TCO2: 31 mmol/L (ref 22–32)
pCO2 arterial: 43 mmHg (ref 32.0–48.0)
pCO2 arterial: 44.1 mmHg (ref 32.0–48.0)
pCO2 arterial: 44.4 mmHg (ref 32.0–48.0)
pH, Arterial: 7.378 (ref 7.350–7.450)
pH, Arterial: 7.392 (ref 7.350–7.450)
pH, Arterial: 7.429 (ref 7.350–7.450)
pO2, Arterial: 214 mmHg — ABNORMAL HIGH (ref 83.0–108.0)
pO2, Arterial: 46 mmHg — ABNORMAL LOW (ref 83.0–108.0)
pO2, Arterial: 51 mmHg — ABNORMAL LOW (ref 83.0–108.0)

## 2021-07-22 LAB — POCT I-STAT EG7
Acid-Base Excess: 3 mmol/L — ABNORMAL HIGH (ref 0.0–2.0)
Acid-Base Excess: 5 mmol/L — ABNORMAL HIGH (ref 0.0–2.0)
Bicarbonate: 28.3 mmol/L — ABNORMAL HIGH (ref 20.0–28.0)
Bicarbonate: 30.2 mmol/L — ABNORMAL HIGH (ref 20.0–28.0)
Calcium, Ion: 1.05 mmol/L — ABNORMAL LOW (ref 1.15–1.40)
Calcium, Ion: 1.13 mmol/L — ABNORMAL LOW (ref 1.15–1.40)
HCT: 25 % — ABNORMAL LOW (ref 36.0–46.0)
HCT: 26 % — ABNORMAL LOW (ref 36.0–46.0)
Hemoglobin: 8.5 g/dL — ABNORMAL LOW (ref 12.0–15.0)
Hemoglobin: 8.8 g/dL — ABNORMAL LOW (ref 12.0–15.0)
O2 Saturation: 81 %
O2 Saturation: 82 %
Potassium: 3.7 mmol/L (ref 3.5–5.1)
Potassium: 4 mmol/L (ref 3.5–5.1)
Sodium: 138 mmol/L (ref 135–145)
Sodium: 140 mmol/L (ref 135–145)
TCO2: 30 mmol/L (ref 22–32)
TCO2: 32 mmol/L (ref 22–32)
pCO2, Ven: 45.1 mmHg (ref 44.0–60.0)
pCO2, Ven: 47.4 mmHg (ref 44.0–60.0)
pH, Ven: 7.406 (ref 7.250–7.430)
pH, Ven: 7.412 (ref 7.250–7.430)
pO2, Ven: 45 mmHg (ref 32.0–45.0)
pO2, Ven: 46 mmHg — ABNORMAL HIGH (ref 32.0–45.0)

## 2021-07-22 LAB — GLUCOSE, CAPILLARY
Glucose-Capillary: 127 mg/dL — ABNORMAL HIGH (ref 70–99)
Glucose-Capillary: 100 mg/dL — ABNORMAL HIGH (ref 70–99)
Glucose-Capillary: 116 mg/dL — ABNORMAL HIGH (ref 70–99)
Glucose-Capillary: 117 mg/dL — ABNORMAL HIGH (ref 70–99)

## 2021-07-22 LAB — BASIC METABOLIC PANEL
Anion gap: 4 — ABNORMAL LOW (ref 5–15)
BUN: 14 mg/dL (ref 8–23)
CO2: 26 mmol/L (ref 22–32)
Calcium: 7.9 mg/dL — ABNORMAL LOW (ref 8.9–10.3)
Chloride: 103 mmol/L (ref 98–111)
Creatinine, Ser: 1.11 mg/dL — ABNORMAL HIGH (ref 0.44–1.00)
GFR, Estimated: 52 mL/min — ABNORMAL LOW (ref >60)
Glucose, Bld: 143 mg/dL — ABNORMAL HIGH (ref 70–99)
Potassium: 4.8 mmol/L (ref 3.5–5.1)
Sodium: 133 mmol/L — ABNORMAL LOW (ref 135–145)

## 2021-07-22 LAB — COOXEMETRY PANEL
Carboxyhemoglobin: 1.3 % (ref 0.5–1.5)
Methemoglobin: 0.9 % (ref 0.0–1.5)
O2 Saturation: 80.2 %
Total hemoglobin: 8.8 g/dL — ABNORMAL LOW (ref 12.0–16.0)

## 2021-07-22 LAB — CARDIAC CATHETERIZATION: Cath EF Quantitative: 45 %

## 2021-07-22 LAB — MAGNESIUM: Magnesium: 2.2 mg/dL (ref 1.7–2.4)

## 2021-07-22 LAB — LIDOCAINE LEVEL: Lidocaine Lvl: >30 ug/mL — ABNORMAL HIGH (ref 1.5–5.0)

## 2021-07-22 IMAGING — MR MR CARD MORPHOLOGY WO/W CM
44 of 48 series · 44 of 48 positions shown · IV contrast (Contrast agent)
Comparison: none

CLINICAL DATA: 75F with extracardiac sarcoid who presents with AMIDU
arrest. Echo with EF 35-40%. Normal coronary arteries on cath

EXAM:
CARDIAC MRI
TECHNIQUE: The patient was scanned on a 1.5 Tesla Siemens magnet. A dedicated
cardiac coil was used. Functional imaging was done using [employer]
sequences. [DATE], and 4 chamber views were done to assess for RWMA's.
Modified AMIDU rule using a short axis stack was used to
calculate an ejection fraction on a dedicated work station using
Circle software. The patient received 7 cc of Gadavist. After 10
minutes inversion recovery sequences were used to assess for
infiltration and scar tissue.
CONTRAST:  7 cc  of Gadavist

[Series 4: [id number] · axial · 8.0mm · 1.41mm/px · 1 of 16 slices shown]
[im 1/16]
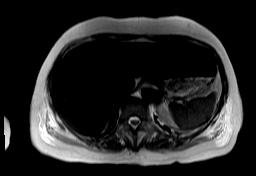

[Series 9: bSSFP · oblique · 8.0mm · 1.61mm/px · 1 of 25 slices shown (1 of 19)]
[im 1/25]
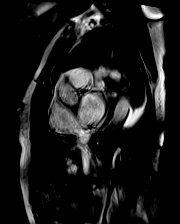

[Series 10: bSSFP · oblique · 8.0mm · 1.61mm/px · 1 of 25 slices shown (2 of 19)]
[im 1/25]
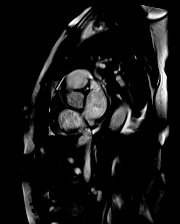

[Series 11: bSSFP · oblique · 8.0mm · 1.61mm/px · 1 of 25 slices shown (3 of 19)]
[im 1/25]
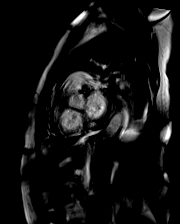

[Series 12: bSSFP · oblique · 8.0mm · 1.61mm/px · 1 of 25 slices shown (4 of 19)]
[im 1/25]
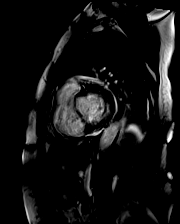

[Series 13: bSSFP · oblique · 8.0mm · 1.61mm/px · 1 of 25 slices shown (5 of 19)]
[im 1/25]
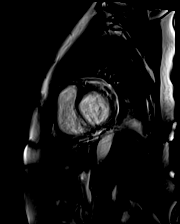

[Series 14: bSSFP · oblique · 8.0mm · 1.61mm/px · 1 of 25 slices shown (6 of 19)]
[im 1/25]
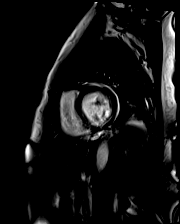

[Series 15: bSSFP · oblique · 8.0mm · 1.61mm/px · 1 of 25 slices shown (7 of 19)]
[im 1/25]
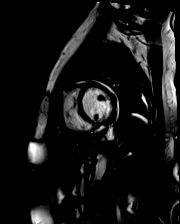

[Series 16: bSSFP · oblique · 8.0mm · 1.61mm/px · 1 of 25 slices shown (8 of 19)]
[im 1/25]
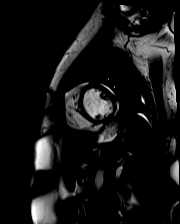

[Series 17: bSSFP · oblique · 8.0mm · 1.61mm/px · 1 of 25 slices shown (9 of 19)]
[im 1/25]
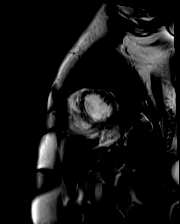

[Series 18: bSSFP · oblique · 8.0mm · 1.61mm/px · 1 of 25 slices shown (10 of 19)]
[im 1/25]
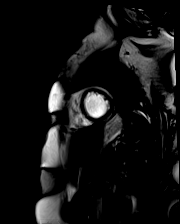

[Series 19: bSSFP · oblique · 8.0mm · 1.61mm/px · 1 of 25 slices shown (11 of 19)]
[im 1/25]
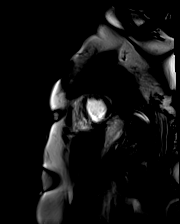

[Series 20: bSSFP · oblique · 8.0mm · 1.61mm/px · 1 of 25 slices shown (12 of 19)]
[im 1/25]
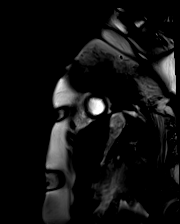

[Series 21: bSSFP · oblique · 8.0mm · 1.61mm/px · 1 of 25 slices shown (13 of 19)]
[im 1/25]
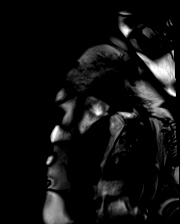

[Series 22: bSSFP · oblique · 8.0mm · 1.61mm/px · 1 of 25 slices shown (14 of 19)]
[im 1/25]
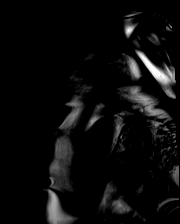

[Series 23: bSSFP · oblique · 8.0mm · 1.61mm/px · 1 of 25 slices shown (15 of 19)]
[im 1/25]
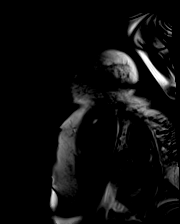

[Series 24: bSSFP · oblique · 6.0mm · 1.41mm/px · 1 of 25 slices shown (16 of 19)]
[im 1/25]
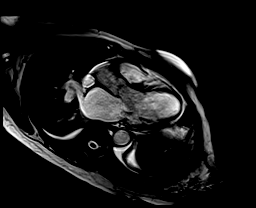

[Series 25: bSSFP · coronal · 6.0mm · 1.41mm/px · 1 of 25 slices shown (17 of 19)]
[im 1/25]
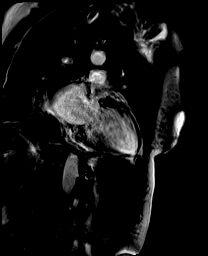

[Series 26: bSSFP · axial · 6.0mm · 1.41mm/px · 1 of 25 slices shown (18 of 19)]
[im 1/25]
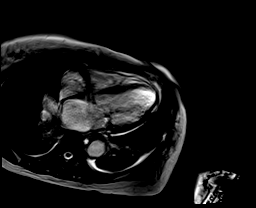

[Series 27: cine_trufi_cs_rt_short axis · oblique · 8.0mm · 1.73mm/px · 1 of 49 slices shown (1 of 16)]
[im 1/49]
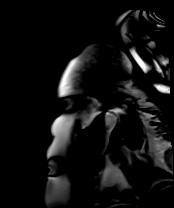

[Series 27: cine_trufi_cs_rt_short axis · oblique · 8.0mm · 1.73mm/px · 1 of 49 slices shown (2 of 16)]
[im 1/49]
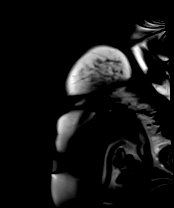

[Series 27: cine_trufi_cs_rt_short axis · oblique · 8.0mm · 1.73mm/px · 1 of 49 slices shown (3 of 16)]
[im 1/49]
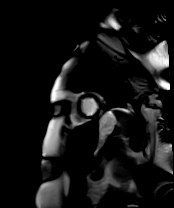

[Series 27: cine_trufi_cs_rt_short axis · oblique · 8.0mm · 1.73mm/px · 1 of 49 slices shown (4 of 16)]
[im 1/49]
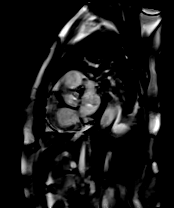

[Series 27: cine_trufi_cs_rt_short axis · oblique · 8.0mm · 1.73mm/px · 1 of 49 slices shown (5 of 16)]
[im 1/49]
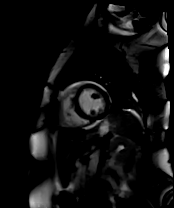

[Series 27: cine_trufi_cs_rt_short axis · oblique · 8.0mm · 1.73mm/px · 1 of 49 slices shown (6 of 16)]
[im 1/49]
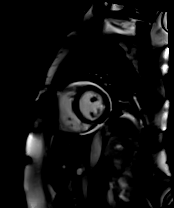

[Series 27: cine_trufi_cs_rt_short axis · oblique · 8.0mm · 1.73mm/px · 1 of 49 slices shown (7 of 16)]
[im 1/49]
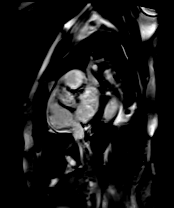

[Series 27: cine_trufi_cs_rt_short axis · oblique · 8.0mm · 1.73mm/px · 1 of 49 slices shown (8 of 16)]
[im 1/49]
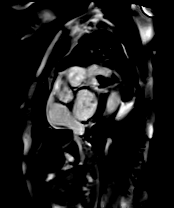

[Series 27: cine_trufi_cs_rt_short axis · oblique · 8.0mm · 1.73mm/px · 1 of 49 slices shown (9 of 16)]
[im 1/49]
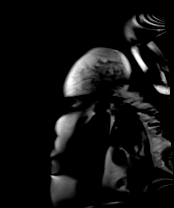

[Series 27: cine_trufi_cs_rt_short axis · oblique · 8.0mm · 1.73mm/px · 1 of 49 slices shown (10 of 16)]
[im 1/49]
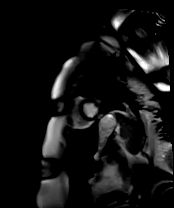

[Series 27: cine_trufi_cs_rt_short axis · oblique · 8.0mm · 1.73mm/px · 1 of 49 slices shown (11 of 16)]
[im 1/49]
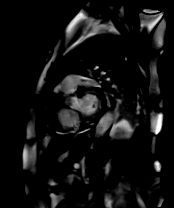

[Series 27: cine_trufi_cs_rt_short axis · oblique · 8.0mm · 1.73mm/px · 1 of 49 slices shown (12 of 16)]
[im 1/49]
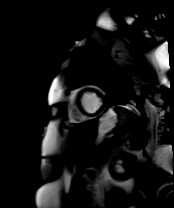

[Series 27: cine_trufi_cs_rt_short axis · oblique · 8.0mm · 1.73mm/px · 1 of 49 slices shown (13 of 16)]
[im 1/49]
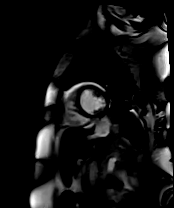

[Series 27: cine_trufi_cs_rt_short axis · oblique · 8.0mm · 1.73mm/px · 1 of 49 slices shown (14 of 16)]
[im 1/49]
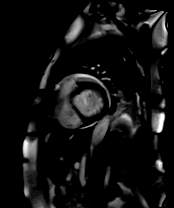

[Series 27: cine_trufi_cs_rt_short axis · oblique · 8.0mm · 1.73mm/px · 1 of 49 slices shown (15 of 16)]
[im 1/49]
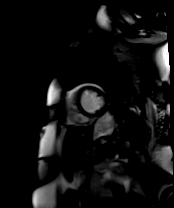

[Series 27: cine_trufi_cs_rt_short axis · oblique · 8.0mm · 1.73mm/px · 1 of 49 slices shown (16 of 16)]
[im 1/49]
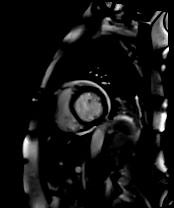

[Series 28: (id)_long_t1 · axial · 8.0mm · 1.56mm/px · 1 of 24 slices shown (1 of 2)]
[im 1/24]
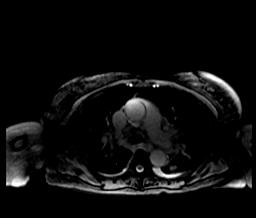

[Series 29: (id)_long_t1_moco · axial · 8.0mm · 1.56mm/px · 1 of 24 slices shown (1 of 2)]
[im 1/24]
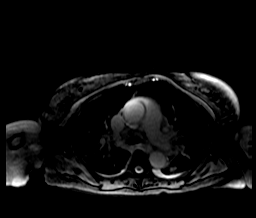

[Series 30: (id)_long_t1_moco_t1 · axial · 8.0mm · 1.56mm/px · 1 of 6 slices shown]
[im 1/6]
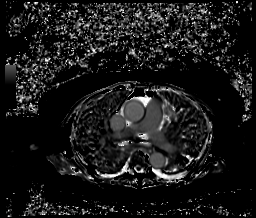

[Series 32: (id)_long_t1 · oblique · 8.0mm · 1.56mm/px · 1 of 24 slices shown (2 of 2)]
[im 1/24]
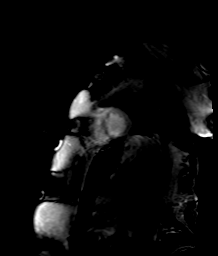

[Series 33: (id)_long_t1_moco · oblique · 8.0mm · 1.56mm/px · 1 of 24 slices shown (2 of 2)]
[im 1/24]
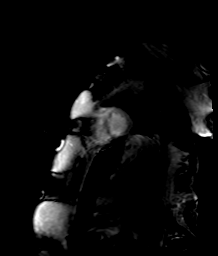

[Series 36: (id)_trufi · oblique · 8.0mm · 2.08mm/px · 1 of 9 slices shown]
[im 1/9]
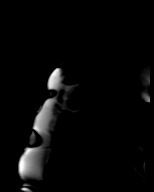

[Series 37: (id)_trufi_moco · oblique · 8.0mm · 2.08mm/px · 1 of 9 slices shown]
[im 1/9]
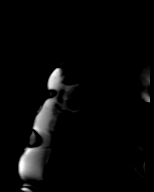

[Series 40: bSSFP · coronal · 6.0mm · 1.41mm/px · 1 of 25 slices shown (19 of 19)]
[im 1/25]
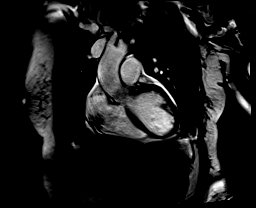

[Series 41: cine rvot · sagittal · 6.0mm · 1.41mm/px · 1 of 25 slices shown]
[im 1/25]
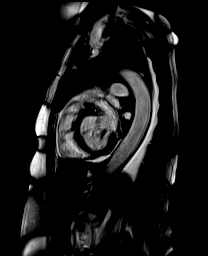

[44 of 48 positions shown; findings below may reference images not displayed]

FINDINGS: Left ventricle:

-Normal size

-Normal systolic function (on dobutamine and norepinephrine during
study)

-Elevated ECV (38%)

-Elevated regional T2 values (up to 61ms in apical anterior wall)

LV EF:  60% (Normal 56-78%)

Absolute volumes:

LV EDV: 122mL (Normal 52-141 mL)

LV ESV: 49mL (Normal 13-51 mL)

LV SV: 73mL (Normal 33-97 mL)

CO: 5.2L/min (Normal 2.7-6.0 L/min)

Indexed volumes:

LV EDV: 71mL/sq-m (Normal 41-81 mL/sq-m)

LV ESV: 29mL/sq-m (Normal 12-21 mL/sq-m)

LV SV: 43mL/sq-m (Normal 26-56 mL/sq-m)

CI: 3.0L/min/sq-m (Normal 1.8-3.8 L/min/sq-m)

Right ventricle: Normal size and systolic function

RV EF: 60% (Normal 47-80%)

Absolute volumes:

RV EDV: 139mL (Normal 58-154 mL)

RV ESV: 55mL (Normal 12-68 mL)

RV SV: 84mL (Normal 35-98 mL)

CO: 5.9L/min (Normal 2.7-6 L/min)

Indexed volumes:

RV EDV: 81mL/sq-m (Normal 48-87 mL/sq-m)

RV ESV: 32mL/sq-m (Normal 11-28 mL/sq-m)

RV SV: 49mL/sq-m (Normal 27-57 mL/sq-m)

CI: 3.4L/min/sq-m (Normal 1.8-3.8 L/min/sq-m)

Left atrium: Mild enlargement

Right atrium: Normal size

Mitral valve: Moderate regurgitation (regurgitant fraction 34%)

Aortic valve: No regurgitation

Tricuspid valve: Moderate regurgitation (regurgitant fraction 25%)

Pulmonic valve: No regurgitation

Aorta: Normal proximal ascending aorta

Pericardium: Small effusion

Extracardiac structures: Dense pulmonary consolidations, better
characterized on CT chest [DATE]
IMPRESSION: 1. Basal septal midwall late gadolinium enhancement, which is a
nonspecific scar pattern seen in nonischemic cardiomyopathies. Given
extracardiac sarcoid, would recommend cardiac PET to evaluate for
cardiac sarcoid

2. Regional elevations in T2 values (along with T1 and ECV)
suggesting myocardial edema. This could be seen in sarcoidosis or
myocarditis, though lack of troponin elevation argues against
myocarditis

3. Normal LV size and systolic function (EF 60%), though patient was
on dobutamine and norepinephrine during study

4.  Normal RV size and systolic function (EF 60%)

5.  Moderate mitral regurgitation (regurgitant fraction 34%)

6.  Moderate tricuspid regurgitation (regurgitant fraction 25%)

7.  Small pericardial effusion

## 2021-07-22 SURGERY — RIGHT/LEFT HEART CATH AND CORONARY ANGIOGRAPHY
Anesthesia: LOCAL

## 2021-07-22 MED ORDER — HEPARIN (PORCINE) IN NACL 1000-0.9 UT/500ML-% IV SOLN
INTRAVENOUS | Status: AC
  Filled 2021-07-22: qty 1000

## 2021-07-22 MED ORDER — FUROSEMIDE 10 MG/ML IJ SOLN: 20.0000 mg | Freq: Once | INTRAMUSCULAR | Status: DC

## 2021-07-22 MED ORDER — VERAPAMIL HCL 2.5 MG/ML IV SOLN
INTRAVENOUS | Status: DC | PRN
  Administered 2021-07-22: 10 mL via INTRA_ARTERIAL

## 2021-07-22 MED ORDER — SODIUM CHLORIDE 0.9 % IV SOLN
250.0000 mL | INTRAVENOUS | Status: DC | PRN
  Administered 2021-07-26: 1000 mL via INTRAVENOUS

## 2021-07-22 MED ORDER — HEPARIN (PORCINE) IN NACL 1000-0.9 UT/500ML-% IV SOLN
INTRAVENOUS | Status: DC | PRN
  Administered 2021-07-22 (×2): 500 mL

## 2021-07-22 MED ORDER — HYDRALAZINE HCL 20 MG/ML IJ SOLN: 10.0000 mg | INTRAMUSCULAR | Status: AC | PRN

## 2021-07-22 MED ORDER — ACETAMINOPHEN 325 MG PO TABS: 650.0000 mg | ORAL_TABLET | ORAL | Status: DC | PRN

## 2021-07-22 MED ORDER — SODIUM CHLORIDE 0.9% FLUSH: 3.0000 mL | INTRAVENOUS | Status: DC | PRN

## 2021-07-22 MED ORDER — MIDODRINE HCL 5 MG PO TABS
10.0000 mg | ORAL_TABLET | Freq: Three times a day (TID) | ORAL | Status: DC
  Administered 2021-07-23 – 2021-07-25 (×7): 10 mg via ORAL
  Filled 2021-07-22 (×8): qty 2

## 2021-07-22 MED ORDER — ENOXAPARIN SODIUM 40 MG/0.4ML IJ SOSY: 40.0000 mg | PREFILLED_SYRINGE | INTRAMUSCULAR | Status: DC

## 2021-07-22 MED ORDER — DOCUSATE SODIUM 50 MG/5ML PO LIQD: 100.0000 mg | Freq: Two times a day (BID) | ORAL | Status: DC | PRN

## 2021-07-22 MED ORDER — IOHEXOL 350 MG/ML SOLN
INTRAVENOUS | Status: DC | PRN
  Administered 2021-07-22: 60 mL

## 2021-07-22 MED ORDER — POLYETHYLENE GLYCOL 3350 17 G PO PACK: 17.0000 g | PACK | Freq: Every day | ORAL | Status: DC | PRN

## 2021-07-22 MED ORDER — LABETALOL HCL 5 MG/ML IV SOLN: 10.0000 mg | INTRAVENOUS | Status: AC | PRN

## 2021-07-22 MED ORDER — LIDOCAINE HCL (PF) 1 % IJ SOLN
INTRAMUSCULAR | Status: DC | PRN
  Administered 2021-07-22 (×2): 2 mL

## 2021-07-22 MED ORDER — ASPIRIN 81 MG PO CHEW: 81.0000 mg | CHEWABLE_TABLET | ORAL | Status: DC

## 2021-07-22 MED ORDER — HEPARIN SODIUM (PORCINE) 1000 UNIT/ML IJ SOLN
INTRAMUSCULAR | Status: AC
  Filled 2021-07-22: qty 1

## 2021-07-22 MED ORDER — SODIUM CHLORIDE 0.9% FLUSH
3.0000 mL | Freq: Two times a day (BID) | INTRAVENOUS | Status: DC
  Administered 2021-07-22 – 2021-07-28 (×8): 3 mL via INTRAVENOUS

## 2021-07-22 MED ORDER — FAMOTIDINE 20 MG PO TABS
20.0000 mg | ORAL_TABLET | Freq: Every day | ORAL | Status: DC
  Administered 2021-07-22 – 2021-07-28 (×7): 20 mg via ORAL
  Filled 2021-07-22 (×7): qty 1

## 2021-07-22 MED ORDER — LIDOCAINE HCL (PF) 1 % IJ SOLN
INTRAMUSCULAR | Status: AC
  Filled 2021-07-22: qty 30

## 2021-07-22 MED ORDER — HEPARIN SODIUM (PORCINE) 1000 UNIT/ML IJ SOLN
INTRAMUSCULAR | Status: DC | PRN
  Administered 2021-07-22: 3000 [IU] via INTRAVENOUS

## 2021-07-22 MED ORDER — GADOBUTROL 1 MMOL/ML IV SOLN
7.0000 mL | Freq: Once | INTRAVENOUS | Status: AC | PRN
  Administered 2021-07-22: 7 mL via INTRAVENOUS

## 2021-07-22 MED ORDER — GUAIFENESIN ER 600 MG PO TB12
600.0000 mg | ORAL_TABLET | Freq: Two times a day (BID) | ORAL | Status: DC
  Administered 2021-07-22 – 2021-07-28 (×12): 600 mg via ORAL
  Filled 2021-07-22 (×13): qty 1

## 2021-07-22 MED ORDER — FUROSEMIDE 10 MG/ML IJ SOLN
40.0000 mg | Freq: Once | INTRAMUSCULAR | Status: AC
  Administered 2021-07-22: 40 mg via INTRAVENOUS
  Filled 2021-07-22: qty 4

## 2021-07-22 MED ORDER — SODIUM CHLORIDE 0.9 % IV SOLN: INTRAVENOUS | Status: DC

## 2021-07-22 MED ORDER — SODIUM CHLORIDE 0.9 % IV SOLN: 250.0000 mL | INTRAVENOUS | Status: DC | PRN

## 2021-07-22 MED ORDER — SODIUM CHLORIDE 0.9% FLUSH
3.0000 mL | Freq: Two times a day (BID) | INTRAVENOUS | Status: DC
  Administered 2021-07-22 – 2021-07-26 (×8): 3 mL via INTRAVENOUS

## 2021-07-22 MED ORDER — VERAPAMIL HCL 2.5 MG/ML IV SOLN
INTRAVENOUS | Status: AC
  Filled 2021-07-22: qty 2

## 2021-07-22 MED ORDER — ASPIRIN 81 MG PO CHEW
81.0000 mg | CHEWABLE_TABLET | ORAL | Status: AC
  Administered 2021-07-22: 81 mg via ORAL
  Filled 2021-07-22: qty 1

## 2021-07-22 MED ORDER — SODIUM CHLORIDE 0.9 % IV SOLN: INTRAVENOUS | Status: AC

## 2021-07-22 SURGICAL SUPPLY — 12 items
CATH 5FR JL3.5 JR4 ANG PIG MP (CATHETERS) ×2 IMPLANT
CATH BALLN WEDGE 5F 110CM (CATHETERS) ×2 IMPLANT
DEVICE RAD COMP TR BAND LRG (VASCULAR PRODUCTS) ×2 IMPLANT
ELECT DEFIB PAD ADLT CADENCE (PAD) ×2 IMPLANT
GLIDESHEATH SLEND SS 6F .021 (SHEATH) ×2 IMPLANT
GUIDEWIRE INQWIRE 1.5J.035X260 (WIRE) ×1 IMPLANT
INQWIRE 1.5J .035X260CM (WIRE) ×2
KIT HEART LEFT (KITS) ×2 IMPLANT
PACK CARDIAC CATHETERIZATION (CUSTOM PROCEDURE TRAY) ×4 IMPLANT
SHEATH GLIDE SLENDER 4/5FR (SHEATH) ×2 IMPLANT
SHEATH PROBE COVER 6X72 (BAG) ×2 IMPLANT
TRANSDUCER W/STOPCOCK (MISCELLANEOUS) ×2 IMPLANT

## 2021-07-22 NOTE — Interval H&P Note (Signed)
History and Physical Interval Note:  07/22/2021 4:31 PM  Robin Estes  has presented today for surgery, with the diagnosis of heart failure.  The various methods of treatment have been discussed with the patient and family. After consideration of risks, benefits and other options for treatment, the patient has consented to  Procedure(s): RIGHT/LEFT HEART CATH AND CORONARY ANGIOGRAPHY (N/A) possible coronary angioplasty as a surgical intervention.  The patient's history has been reviewed, patient examined, no change in status, stable for surgery.  I have reviewed the patient's chart and labs.  Questions were answered to the patient's satisfaction.     Feras Gardella

## 2021-07-22 NOTE — Progress Notes (Addendum)
Advanced Heart Failure Rounding Note   Subjective:    Echo 07/18/20 EF 35-40% Severe MR 11/19 Head CT negative for acute process.  11/20 Extubated to 3 liters [place].  11/21 Dobutamine cut back to 5 mg tid and Norep weaning. Started on midodrine.   Remains on Norepi 6 mcg + Dobutamine 5 mcg. CO-OX 80%.   Creatinine down to 1.1 today   Remains on amio drip at 30 mg per hour.   Denies SOB. Denies pain.    Objective:   Weight Range:  Vital Signs:   Temp:  [97.4 F (36.3 C)-98.1 F (36.7 C)] 98.1 F (36.7 C) (11/22 0630) Pulse Rate:  [65-95] 73 (11/22 0630) Resp:  [15-41] 19 (11/22 0630) BP: (92-137)/(40-103) 114/53 (11/22 0630) SpO2:  [92 %-100 %] 96 % (11/22 0630) Weight:  [65 kg] 65 kg (11/22 0500) Last BM Date: 07/20/21  Weight change: Filed Weights   07/19/21 0500 07/20/21 0500 07/22/21 0500  Weight: 63 kg 63 kg 65 kg    Intake/Output:   Intake/Output Summary (Last 24 hours) at 07/22/2021 0703 Last data filed at 07/22/2021 0641 Gross per 24 hour  Intake 1493.78 ml  Output 2450 ml  Net -956.22 ml    CVP 6 personally checked.  Physical Exam: General:  No resp difficulty HEENT: normal Neck: supple. JVP 6-7 . Carotids 2+ bilat; no bruits. No lymphadenopathy or thryomegaly appreciated. Cor: PMI nondisplaced. Regular rate & rhythm. No rubs, gallops or murmurs. Lungs: IW with crackles in the bases on room air.  Abdomen: soft, nontender, nondistended. No hepatosplenomegaly. No bruits or masses. Good bowel sounds. Extremities: no cyanosis, clubbing, rash, edema. RUE PICC  Neuro: alert & orientedx3, cranial nerves grossly intact. moves all 4 extremities w/o difficulty. Affect pleasant  Telemetry: SR 70-80s    Labs: Basic Metabolic Panel: Recent Labs  Lab 07/18/21 1343 07/18/21 2204 07/19/21 0541 07/19/21 1039 07/19/21 1911 07/20/21 0315 07/20/21 0600 07/20/21 0636 07/21/21 0456 07/22/21 0256  NA 134*  --  126* 124* 124* 123* 128* 130* 128* 133*   K 3.3*  --  5.1 4.2 4.1 3.4* 3.5 3.4* 3.7 4.8  CL 102  --  96* 94* 95* 94* 98  --  97* 103  CO2 25  --  19* 19* 19* 20* 22  --  24 26  GLUCOSE 288*  --  445* 476* 439* 352* 234*  --  287* 143*  BUN 7*  --  12 12 15 16 18   --  19 14  CREATININE 0.90  --  1.64* 1.89* 2.12* 2.04* 2.00*  --  1.53* 1.11*  CALCIUM 8.0*  --  7.5* 7.6* 7.7* 7.5* 7.7*  --  7.6* 7.9*  MG 2.6* 2.6* 2.4 2.4 2.2  --   --   --  1.9 2.2  PHOS 3.2 3.5 4.6  --  3.9  --   --   --   --   --     Liver Function Tests: Recent Labs  Lab 07/17/21 2216 07/18/21 0229  AST 41 56*  ALT 21 25  ALKPHOS 123 127*  BILITOT 1.3* 1.4*  PROT 6.3* 5.8*  ALBUMIN 2.9* 2.7*   No results for input(s): LIPASE, AMYLASE in the last 168 hours. No results for input(s): AMMONIA in the last 168 hours.  CBC: Recent Labs  Lab 07/17/21 2216 07/17/21 2227 07/18/21 0229 07/18/21 0248 07/18/21 0623 07/18/21 1114 07/19/21 0541 07/20/21 0636 07/21/21 0856  WBC 4.3  --  7.6  --   --   --  9.7  --  13.2*  NEUTROABS 3.2  --  4.9  --   --   --  8.7*  --   --   HGB 9.4*   < > 9.7*   < > 8.8* 10.2* 9.7* 9.9* 8.7*  HCT 30.4*   < > 31.0*   < > 26.0* 30.0* 31.4* 29.0* 27.3*  MCV 94.7  --  96.0  --   --   --  94.9  --  91.6  PLT 320  --  376  --   --   --  367  --  PLATELET CLUMPS NOTED ON SMEAR, UNABLE TO ESTIMATE   < > = values in this interval not displayed.    Cardiac Enzymes: No results for input(s): CKTOTAL, CKMB, CKMBINDEX, TROPONINI in the last 168 hours.  BNP: BNP (last 3 results) Recent Labs    04/18/21 1340 07/18/21 0230  BNP 124.9* 893.2*    ProBNP (last 3 results) No results for input(s): PROBNP in the last 8760 hours.    Other results:  Imaging: No results found.   Medications:     Scheduled Medications:  chlorhexidine  15 mL Mouth Rinse BID   Chlorhexidine Gluconate Cloth  6 each Topical Daily   feeding supplement  237 mL Oral BID BM   heparin  5,000 Units Subcutaneous Q8H   insulin aspart  0-15 Units  Subcutaneous TID WC   insulin aspart  0-5 Units Subcutaneous QHS   mouth rinse  15 mL Mouth Rinse q12n4p   midodrine  5 mg Oral TID WC   sodium chloride flush  10-40 mL Intracatheter Q12H    Infusions:  amiodarone 30 mg/hr (07/22/21 0641)   cefTRIAXone (ROCEPHIN)  IV Stopped (07/21/21 1414)   DOBUTamine 5 mcg/kg/min (07/22/21 0641)   famotidine (PEPCID) IV Stopped (07/21/21 1209)   levETIRAcetam Stopped (07/22/21 0524)   norepinephrine (LEVOPHED) Adult infusion 6 mcg/min (07/22/21 0641)    PRN Medications: docusate, fentaNYL, midazolam, ondansetron (ZOFRAN) IV, polyethylene glycol, sodium chloride flush   Assessment/Plan:   1. 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 Lido now off - Keep K > 4.0 and Mg > 2.0- Given 2 grams and replacing K.  - Will need cath to make sure no high-grade ischemia (have to wait un ATN recovers) - set up cath tomorrow. Creatinine 1.11 today.  - set up for cMRI.  - Continue with empiric steroids   2. Chronic Systolic Heart Failure -> cardiogenic shock  - ECHO 03/2019 EF 30-35% . Myoview - no ischemia. 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 RV normal. Minimal LGE -> doubt significant cardiac sarcoid - Echo 12/20 EF ~45% MV appears thickened and possible rheumatic with 3+ MR. Severe LAE. Mod TR.  - TEE 8/21: LVEF 40-45% w/ mild global HK moderate to severe functional central MR. - Echo 07/18/20 EF 35-40% Severe MR - At baseline NYHA class II symptoms?   - Developed cardiogenic shock on 11/19 with co-ox 42% - CVP 6-7 . Given 40 mg IV lasix x1. Sound swet on exam.  - Continue Dobutamine 5 mcg and wean Norepi - Continue 5 mg midodrine tid.  - Holding GDMT with arrest and shock - LHC today    3. Acute hypoxic respiratory failure in setting of #1 - Extubated 07/20/21 to  3  liters. has woken up.  - Stable on room air.   4. MAT - Zio in 9/20       1. Sinus rhythm - avg HR of 82      2. 22 Supraventricular Tachycardia runs occurred, the run with the fastest interval lasting 5 beats with a max rate of 184 bpm, the longest lasting 10.9 secs with an avg rate of 162 bpm.     3. Isolated PACs were occasional (4.7%, 18936)     4. Rare PVCs (<1.0%) - currently in NSR  5. 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 - She had been referred for possible [employer]. Did not keep appointment last fall due to gap in insurance coverage - Severe MR on echo.   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 Everardo All in Pulmonary. Sarcoid not felt to be active previously - Plan as above.  - Completed steroids.  - Plan cMRI and PET   7. Hypokalemia/hypomagnesemia - As above replace K and Mag.   8. AKI due to ATN/shock - baseline SCr  0.9 - Trending down to 1.1 today.   9. Hyperglycemia - likely steroid-induced  LHC/RHC later today. CMRI ordered  Length of Stay: 4   Amy Clegg NP-C  07/22/2021, 7:03 AM  Advanced Heart Failure Team Pager [phone] (M-F; 7a - 4p)  Please contact [facility] for night-coverage after hours (4p -7a )   Agree with above.   Remains on NE 6 and DBA 5. Co-ox 80% Rhythm stable on IV amio. CVP 6-7. Feels weka. Denies SOB, orthopnea or PND  General:  Elderly woman sitting in chair.  No resp difficulty HEENT: normal + sarcoid pigment changes Neck: supple.JVP 7 Carotids 2+ bilat; no bruits. No lymphadenopathy or thryomegaly appreciated. Cor: PMI nondisplaced. Regular rate & rhythm. No rubs, gallops or murmurs. Lungs: clear Abdomen: soft, nontender, nondistended. No hepatosplenomegaly. No bruits or masses. Good bowel sounds. Extremities: no cyanosis, clubbing, rash, edema Neuro: alert & orientedx3, cranial nerves grossly intact. moves all 4 extremities w/o difficulty. Affect  pleasant  Remains tenuous but improving. Start midodrine to help wean off NE. Continue DBA for now. Plan cMRI and L/R cath today. Continue steroids. Will likely need ICD prior to d/c. (Otherwise LifeVest).  CRITICAL CARE Performed by: Arvilla Meres  Total critical care time: 35 minutes  Critical care time was exclusive of separately billable procedures and treating other patients.  Critical care was necessary to treat or prevent imminent or life-threatening deterioration.  Critical care was time spent personally by me (independent of midlevel providers or residents) on the following activities: development of treatment plan with patient and/or surrogate as well as nursing, discussions with consultants, evaluation of patient's response to treatment, examination of patient, obtaining history from patient or surrogate, ordering and performing treatments and interventions, ordering and review of laboratory studies, ordering and review of radiographic studies, pulse oximetry and re-evaluation of patient's condition.  Arvilla Meres, MD  2:03 PM

## 2021-07-22 NOTE — H&P (View-Only) (Signed)
Advanced Heart Failure Rounding Note   Subjective:    Echo 07/18/20 EF 35-40% Severe MR 11/19 Head CT negative for acute process.  11/20 Extubated to 3 liters [place].  11/21 Dobutamine cut back to 5 mg tid and Norep weaning. Started on midodrine.   Remains on Norepi 6 mcg + Dobutamine 5 mcg. CO-OX 80%.   Creatinine down to 1.1 today   Remains on amio drip at 30 mg per hour.   Denies SOB. Denies pain.    Objective:   Weight Range:  Vital Signs:   Temp:  [97.4 F (36.3 C)-98.1 F (36.7 C)] 98.1 F (36.7 C) (11/22 0630) Pulse Rate:  [65-95] 73 (11/22 0630) Resp:  [15-41] 19 (11/22 0630) BP: (92-137)/(40-103) 114/53 (11/22 0630) SpO2:  [92 %-100 %] 96 % (11/22 0630) Weight:  [65 kg] 65 kg (11/22 0500) Last BM Date: 07/20/21  Weight change: Filed Weights   07/19/21 0500 07/20/21 0500 07/22/21 0500  Weight: 63 kg 63 kg 65 kg    Intake/Output:   Intake/Output Summary (Last 24 hours) at 07/22/2021 0703 Last data filed at 07/22/2021 0641 Gross per 24 hour  Intake 1493.78 ml  Output 2450 ml  Net -956.22 ml    CVP 6 personally checked.  Physical Exam: General:  No resp difficulty HEENT: normal Neck: supple. JVP 6-7 . Carotids 2+ bilat; no bruits. No lymphadenopathy or thryomegaly appreciated. Cor: PMI nondisplaced. Regular rate & rhythm. No rubs, gallops or murmurs. Lungs: IW with crackles in the bases on room air.  Abdomen: soft, nontender, nondistended. No hepatosplenomegaly. No bruits or masses. Good bowel sounds. Extremities: no cyanosis, clubbing, rash, edema. RUE PICC  Neuro: alert & orientedx3, cranial nerves grossly intact. moves all 4 extremities w/o difficulty. Affect pleasant  Telemetry: SR 70-80s    Labs: Basic Metabolic Panel: Recent Labs  Lab 07/18/21 1343 07/18/21 2204 07/19/21 0541 07/19/21 1039 07/19/21 1911 07/20/21 0315 07/20/21 0600 07/20/21 0636 07/21/21 0456 07/22/21 0256  NA 134*  --  126* 124* 124* 123* 128* 130* 128* 133*   K 3.3*  --  5.1 4.2 4.1 3.4* 3.5 3.4* 3.7 4.8  CL 102  --  96* 94* 95* 94* 98  --  97* 103  CO2 25  --  19* 19* 19* 20* 22  --  24 26  GLUCOSE 288*  --  445* 476* 439* 352* 234*  --  287* 143*  BUN 7*  --  12 12 15 16 18   --  19 14  CREATININE 0.90  --  1.64* 1.89* 2.12* 2.04* 2.00*  --  1.53* 1.11*  CALCIUM 8.0*  --  7.5* 7.6* 7.7* 7.5* 7.7*  --  7.6* 7.9*  MG 2.6* 2.6* 2.4 2.4 2.2  --   --   --  1.9 2.2  PHOS 3.2 3.5 4.6  --  3.9  --   --   --   --   --     Liver Function Tests: Recent Labs  Lab 07/17/21 2216 07/18/21 0229  AST 41 56*  ALT 21 25  ALKPHOS 123 127*  BILITOT 1.3* 1.4*  PROT 6.3* 5.8*  ALBUMIN 2.9* 2.7*   No results for input(s): LIPASE, AMYLASE in the last 168 hours. No results for input(s): AMMONIA in the last 168 hours.  CBC: Recent Labs  Lab 07/17/21 2216 07/17/21 2227 07/18/21 0229 07/18/21 0248 07/18/21 0623 07/18/21 1114 07/19/21 0541 07/20/21 0636 07/21/21 0856  WBC 4.3  --  7.6  --   --   --  9.7  --  13.2*  NEUTROABS 3.2  --  4.9  --   --   --  8.7*  --   --   HGB 9.4*   < > 9.7*   < > 8.8* 10.2* 9.7* 9.9* 8.7*  HCT 30.4*   < > 31.0*   < > 26.0* 30.0* 31.4* 29.0* 27.3*  MCV 94.7  --  96.0  --   --   --  94.9  --  91.6  PLT 320  --  376  --   --   --  367  --  PLATELET CLUMPS NOTED ON SMEAR, UNABLE TO ESTIMATE   < > = values in this interval not displayed.    Cardiac Enzymes: No results for input(s): CKTOTAL, CKMB, CKMBINDEX, TROPONINI in the last 168 hours.  BNP: BNP (last 3 results) Recent Labs    04/18/21 1340 07/18/21 0230  BNP 124.9* 893.2*    ProBNP (last 3 results) No results for input(s): PROBNP in the last 8760 hours.    Other results:  Imaging: No results found.   Medications:     Scheduled Medications:  chlorhexidine  15 mL Mouth Rinse BID   Chlorhexidine Gluconate Cloth  6 each Topical Daily   feeding supplement  237 mL Oral BID BM   heparin  5,000 Units Subcutaneous Q8H   insulin aspart  0-15 Units  Subcutaneous TID WC   insulin aspart  0-5 Units Subcutaneous QHS   mouth rinse  15 mL Mouth Rinse q12n4p   midodrine  5 mg Oral TID WC   sodium chloride flush  10-40 mL Intracatheter Q12H    Infusions:  amiodarone 30 mg/hr (07/22/21 0641)   cefTRIAXone (ROCEPHIN)  IV Stopped (07/21/21 1414)   DOBUTamine 5 mcg/kg/min (07/22/21 0641)   famotidine (PEPCID) IV Stopped (07/21/21 1209)   levETIRAcetam Stopped (07/22/21 0524)   norepinephrine (LEVOPHED) Adult infusion 6 mcg/min (07/22/21 0641)    PRN Medications: docusate, fentaNYL, midazolam, ondansetron (ZOFRAN) IV, polyethylene glycol, sodium chloride flush   Assessment/Plan:   1. 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 Lido now off - Keep K > 4.0 and Mg > 2.0- Given 2 grams and replacing K.  - Will need cath to make sure no high-grade ischemia (have to wait un ATN recovers) - set up cath tomorrow. Creatinine 1.11 today.  - set up for cMRI.  - Continue with empiric steroids   2. Chronic Systolic Heart Failure -> cardiogenic shock  - ECHO 03/2019 EF 30-35% . Myoview - no ischemia. 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 RV normal. Minimal LGE -> doubt significant cardiac sarcoid - Echo 12/20 EF ~45% MV appears thickened and possible rheumatic with 3+ MR. Severe LAE. Mod TR.  - TEE 8/21: LVEF 40-45% w/ mild global HK moderate to severe functional central MR. - Echo 07/18/20 EF 35-40% Severe MR - At baseline NYHA class II symptoms?   - Developed cardiogenic shock on 11/19 with co-ox 42% - CVP 6-7 . Given 40 mg IV lasix x1. Sound swet on exam.  - Continue Dobutamine 5 mcg and wean Norepi - Continue 5 mg midodrine tid.  - Holding GDMT with arrest and shock - LHC today    3. Acute hypoxic respiratory failure in setting of #1 - Extubated 07/20/21 to  3  liters. has woken up.  - Stable on room air.   4. MAT - Zio in 9/20       1. Sinus rhythm - avg HR of 82      2. 22 Supraventricular Tachycardia runs occurred, the run with the fastest interval lasting 5 beats with a max rate of 184 bpm, the longest lasting 10.9 secs with an avg rate of 162 bpm.     3. Isolated PACs were occasional (4.7%, 18936)     4. Rare PVCs (<1.0%) - currently in NSR  5. 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 - She had been referred for possible [employer]. Did not keep appointment last fall due to gap in insurance coverage - Severe MR on echo.   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 Everardo All in Pulmonary. Sarcoid not felt to be active previously - Plan as above.  - Completed steroids.  - Plan cMRI and PET   7. Hypokalemia/hypomagnesemia - As above replace K and Mag.   8. AKI due to ATN/shock - baseline SCr  0.9 - Trending down to 1.1 today.   9. Hyperglycemia - likely steroid-induced  LHC/RHC later today. CMRI ordered  Length of Stay: 4   Amy Clegg NP-C  07/22/2021, 7:03 AM  Advanced Heart Failure Team Pager [phone] (M-F; 7a - 4p)  Please contact [facility] for night-coverage after hours (4p -7a )   Agree with above.   Remains on NE 6 and DBA 5. Co-ox 80% Rhythm stable on IV amio. CVP 6-7. Feels weka. Denies SOB, orthopnea or PND  General:  Elderly woman sitting in chair.  No resp difficulty HEENT: normal + sarcoid pigment changes Neck: supple.JVP 7 Carotids 2+ bilat; no bruits. No lymphadenopathy or thryomegaly appreciated. Cor: PMI nondisplaced. Regular rate & rhythm. No rubs, gallops or murmurs. Lungs: clear Abdomen: soft, nontender, nondistended. No hepatosplenomegaly. No bruits or masses. Good bowel sounds. Extremities: no cyanosis, clubbing, rash, edema Neuro: alert & orientedx3, cranial nerves grossly intact. moves all 4 extremities w/o difficulty. Affect  pleasant  Remains tenuous but improving. Start midodrine to help wean off NE. Continue DBA for now. Plan cMRI and L/R cath today. Continue steroids. Will likely need ICD prior to d/c. (Otherwise LifeVest).  CRITICAL CARE Performed by: Arvilla Meres  Total critical care time: 35 minutes  Critical care time was exclusive of separately billable procedures and treating other patients.  Critical care was necessary to treat or prevent imminent or life-threatening deterioration.  Critical care was time spent personally by me (independent of midlevel providers or residents) on the following activities: development of treatment plan with patient and/or surrogate as well as nursing, discussions with consultants, evaluation of patient's response to treatment, examination of patient, obtaining history from patient or surrogate, ordering and performing treatments and interventions, ordering and review of laboratory studies, ordering and review of radiographic studies, pulse oximetry and re-evaluation of patient's condition.  Arvilla Meres, MD  2:03 PM

## 2021-07-22 NOTE — Progress Notes (Signed)
PT Cancellation Note  Patient Details Name: Robin Estes MRN: [id number] DOB: [birth year]   Cancelled Treatment:    Reason Eval/Treat Not Completed: Patient at procedure or test/unavailable   Angelina Ok [facility] 07/22/2021, 9:43 AM Skip Mayer PT Acute Rehabilitation Services Pager [phone] Office [phone]

## 2021-07-22 NOTE — Evaluation (Signed)
Physical Therapy Evaluation Patient Details Name: Robin Estes MRN: [id number] DOB: July 04, 1946 Today's Date: 07/22/2021  History of Present Illness  Pt adm 11/18 with cardiac arrest with V-fib in ED. Pt intubated on 11/18. Extubated 11/20.  PMH - sarcoidosis, HTN, NICM, chf, legally blind rt eye  Clinical Impression  Pt admitted with above diagnosis and presents to PT with functional limitations due to deficits listed below (See PT problem list). Pt needs skilled PT to maximize independence and safety to allow discharge to home with supportive family. Expect pt will make steady progress and hopefully will be at supervision level for mobility at discharge.         Recommendations for follow up therapy are one component of a multi-disciplinary discharge planning process, led by the attending physician.  Recommendations may be updated based on patient status, additional functional criteria and insurance authorization.  Follow Up Recommendations Home health PT    Assistance Recommended at Discharge Frequent or constant Supervision/Assistance  Functional Status Assessment Patient has had a recent decline in their functional status and demonstrates the ability to make significant improvements in function in a reasonable and predictable amount of time.  Equipment Recommendations  Other (comment) (will need rolling walker vs rollator and daughter says they can borrow needed equipment)    Recommendations for Other Services       Precautions / Restrictions Precautions Precautions: Fall      Mobility  Bed Mobility Overal bed mobility: Needs Assistance Bed Mobility: Supine to Sit     Supine to sit: Min assist;HOB elevated     General bed mobility comments: Assist to bring legs off of bed and elevate trunk into sitting    Transfers Overall transfer level: Needs assistance Equipment used: Rollator (4 wheels) Transfers: Sit to/from Stand Sit to Stand: Min assist            General transfer comment: Assist to bring hips up and for balance    Ambulation/Gait Ambulation/Gait assistance: Min assist Gait Distance (Feet): 8 Feet Assistive device: Rollator (4 wheels) Gait Pattern/deviations: Step-through pattern;Decreased step length - right;Decreased step length - left;Drifts right/left Gait velocity: decr Gait velocity interpretation: <1.31 ft/sec, indicative of household ambulator   General Gait Details: Pt unsteady with gait requiring assist for balance and support  Stairs            Wheelchair Mobility    Modified Rankin (Stroke Patients Only)       Balance Overall balance assessment: Needs assistance Sitting-balance support: Feet supported;No upper extremity supported Sitting balance-Leahy Scale: Fair     Standing balance support: Single extremity supported;Bilateral upper extremity supported;Reliant on assistive device for balance Standing balance-Leahy Scale: Poor Standing balance comment: UE support and min assist for static standing                             Pertinent Vitals/Pain Pain Assessment: No/denies pain    Home Living Family/patient expects to be discharged to:: Private residence Living Arrangements: Spouse/significant other Available Help at Discharge: Family;Available 24 hours/day Type of Home: House Home Access: Stairs to enter   [employer] of Steps: 1   Home Layout: One level Home Equipment: Other (comment) (Daughter reports they can borrow any equipment that pt might need)      Prior Function Prior Level of Function : Independent/Modified Independent             Mobility Comments: No assistive device  Hand Dominance        Extremity/Trunk Assessment   Upper Extremity Assessment Upper Extremity Assessment: Defer to OT evaluation    Lower Extremity Assessment Lower Extremity Assessment: Generalized weakness       Communication   Communication: Other (comment)  (low volume)  Cognition Arousal/Alertness: Awake/alert Behavior During Therapy: WFL for tasks assessed/performed Overall Cognitive Status: Impaired/Different from baseline Area of Impairment: Problem solving;Safety/judgement;Following commands                       Following Commands: Follows one step commands with increased time Safety/Judgement: Decreased awareness of deficits   Problem Solving: Slow processing          General Comments General comments (skin integrity, edema, etc.): VSS on RA    Exercises     Assessment/Plan    PT Assessment Patient needs continued PT services  PT Problem List Decreased strength;Decreased balance;Decreased cognition;Decreased mobility;Decreased activity tolerance;Decreased safety awareness       PT Treatment Interventions DME instruction;Functional mobility training;Balance training;Patient/family education;Gait training;Therapeutic activities;Stair training;Therapeutic exercise    PT Goals (Current goals can be found in the Care Plan section)  Acute Rehab PT Goals Patient Stated Goal: Dance per pt. Back to cooking per daughter PT Goal Formulation: With patient/family Time For Goal Achievement: 08/12/21 Potential to Achieve Goals: Good    Frequency Min 3X/week   Barriers to discharge        Co-evaluation               AM-PAC PT "6 Clicks" Mobility  Outcome Measure Help needed turning from your back to your side while in a flat bed without using bedrails?: A Little Help needed moving from lying on your back to sitting on the side of a flat bed without using bedrails?: A Little Help needed moving to and from a bed to a chair (including a wheelchair)?: A Little Help needed standing up from a chair using your arms (e.g., wheelchair or bedside chair)?: A Little Help needed to walk in hospital room?: A Little Help needed climbing 3-5 steps with a railing? : A Lot 6 Click Score: 17    End of Session Equipment  Utilized During Treatment: Gait belt Activity Tolerance: Patient tolerated treatment well Patient left: in chair;with call bell/phone within reach;with chair alarm set;with family/visitor present Nurse Communication: Mobility status PT Visit Diagnosis: Unsteadiness on feet (R26.81);Other abnormalities of gait and mobility (R26.89);Muscle weakness (generalized) (M62.81)    Time: 1135-1204 PT Time Calculation (min) (ACUTE ONLY): 29 min   Charges:   PT Evaluation $PT Eval Moderate Complexity: 1 Mod PT Treatments $Gait Training: 8-22 mins        [facility] PT Acute Rehabilitation Services Pager [phone] Office [phone]   Angelina Ok [facility] 07/22/2021, 12:56 PM

## 2021-07-23 ENCOUNTER — Encounter (HOSPITAL_COMMUNITY): Payer: Self-pay | Admitting: Internal Medicine

## 2021-07-23 LAB — COOXEMETRY PANEL
Carboxyhemoglobin: 1.7 % — ABNORMAL HIGH (ref 0.5–1.5)
Methemoglobin: 0.9 % (ref 0.0–1.5)
O2 Saturation: 74.5 %
Total hemoglobin: 8.4 g/dL — ABNORMAL LOW (ref 12.0–16.0)

## 2021-07-23 LAB — BASIC METABOLIC PANEL
Anion gap: 5 (ref 5–15)
BUN: 11 mg/dL (ref 8–23)
CO2: 29 mmol/L (ref 22–32)
Calcium: 8 mg/dL — ABNORMAL LOW (ref 8.9–10.3)
Chloride: 104 mmol/L (ref 98–111)
Creatinine, Ser: 0.98 mg/dL (ref 0.44–1.00)
GFR, Estimated: 60 mL/min — ABNORMAL LOW (ref >60)
Glucose, Bld: 89 mg/dL (ref 70–99)
Potassium: 3.9 mmol/L (ref 3.5–5.1)
Sodium: 138 mmol/L (ref 135–145)

## 2021-07-23 LAB — GLUCOSE, CAPILLARY
Glucose-Capillary: 83 mg/dL (ref 70–99)
Glucose-Capillary: 145 mg/dL — ABNORMAL HIGH (ref 70–99)
Glucose-Capillary: 61 mg/dL — ABNORMAL LOW (ref 70–99)
Glucose-Capillary: 86 mg/dL (ref 70–99)
Glucose-Capillary: 127 mg/dL — ABNORMAL HIGH (ref 70–99)

## 2021-07-23 LAB — CBC
HCT: 25.5 % — ABNORMAL LOW (ref 36.0–46.0)
Hemoglobin: 8.4 g/dL — ABNORMAL LOW (ref 12.0–15.0)
MCH: 29.9 pg (ref 26.0–34.0)
MCHC: 32.9 g/dL (ref 30.0–36.0)
MCV: 90.7 fL (ref 80.0–100.0)
Platelets: 200 10*3/uL (ref 150–400)
RBC: 2.81 MIL/uL — ABNORMAL LOW (ref 3.87–5.11)
RDW: 16.1 % — ABNORMAL HIGH (ref 11.5–15.5)
WBC: 5.7 10*3/uL (ref 4.0–10.5)
nRBC: 0 % (ref 0.0–0.2)

## 2021-07-23 MED ORDER — PREDNISONE 10 MG PO TABS: 15.0000 mg | ORAL_TABLET | Freq: Every day | ORAL | Status: DC

## 2021-07-23 MED ORDER — DOCUSATE SODIUM 50 MG/5ML PO LIQD
100.0000 mg | Freq: Two times a day (BID) | ORAL | Status: DC
  Administered 2021-07-23 – 2021-07-27 (×7): 100 mg via ORAL
  Filled 2021-07-23 (×8): qty 10

## 2021-07-23 MED ORDER — PREDNISONE 5 MG PO TABS: 7.5000 mg | ORAL_TABLET | Freq: Every day | ORAL | Status: DC

## 2021-07-23 MED ORDER — PREDNISONE 50 MG PO TABS: 25.0000 mg | ORAL_TABLET | Freq: Every day | ORAL | Status: DC

## 2021-07-23 MED ORDER — VITAMIN D 25 MCG (1000 UNIT) PO TABS
1000.0000 [IU] | ORAL_TABLET | Freq: Every day | ORAL | Status: DC
  Administered 2021-07-23 – 2021-07-28 (×6): 1000 [IU] via ORAL
  Filled 2021-07-23 (×6): qty 1

## 2021-07-23 MED ORDER — PREDNISONE 20 MG PO TABS: 20.0000 mg | ORAL_TABLET | Freq: Every day | ORAL | Status: DC

## 2021-07-23 MED ORDER — PANTOPRAZOLE SODIUM 20 MG PO TBEC
20.0000 mg | DELAYED_RELEASE_TABLET | Freq: Every day | ORAL | Status: DC
  Administered 2021-07-23 – 2021-07-28 (×6): 20 mg via ORAL
  Filled 2021-07-23 (×6): qty 1

## 2021-07-23 MED ORDER — METHOTREXATE 2.5 MG PO TABS: 15.0000 mg | ORAL_TABLET | ORAL | Status: DC

## 2021-07-23 MED ORDER — CALCIUM CITRATE 950 (200 CA) MG PO TABS
200.0000 mg | ORAL_TABLET | Freq: Two times a day (BID) | ORAL | Status: DC
  Administered 2021-07-23 – 2021-07-28 (×9): 200 mg via ORAL
  Filled 2021-07-23 (×13): qty 1

## 2021-07-23 MED ORDER — PREDNISONE 20 MG PO TABS
30.0000 mg | ORAL_TABLET | Freq: Once | ORAL | Status: AC
  Administered 2021-07-23: 30 mg via ORAL
  Filled 2021-07-23: qty 1

## 2021-07-23 MED ORDER — POTASSIUM CHLORIDE CRYS ER 20 MEQ PO TBCR
20.0000 meq | EXTENDED_RELEASE_TABLET | Freq: Once | ORAL | Status: AC
Start: 2021-07-23 — End: 2021-07-23
  Administered 2021-07-23: 20 meq via ORAL
  Filled 2021-07-23: qty 1

## 2021-07-23 MED ORDER — FOLIC ACID 1 MG PO TABS
1.0000 mg | ORAL_TABLET | Freq: Every day | ORAL | Status: DC
  Administered 2021-07-23 – 2021-07-28 (×6): 1 mg via ORAL
  Filled 2021-07-23 (×6): qty 1

## 2021-07-23 MED ORDER — SULFAMETHOXAZOLE-TRIMETHOPRIM 800-160 MG PO TABS
1.0000 | ORAL_TABLET | ORAL | Status: DC
  Administered 2021-07-23 – 2021-07-28 (×3): 1 via ORAL
  Filled 2021-07-23 (×3): qty 1

## 2021-07-23 MED ORDER — PREDNISONE 20 MG PO TABS
30.0000 mg | ORAL_TABLET | Freq: Every day | ORAL | Status: DC
  Administered 2021-07-24 – 2021-07-28 (×4): 30 mg via ORAL
  Filled 2021-07-23 (×5): qty 1

## 2021-07-23 MED ORDER — SODIUM CHLORIDE 0.9% FLUSH
3.0000 mL | Freq: Two times a day (BID) | INTRAVENOUS | Status: DC
  Administered 2021-07-23 – 2021-07-26 (×6): 3 mL via INTRAVENOUS

## 2021-07-23 MED ORDER — METHOTREXATE 2.5 MG PO TABS: 17.5000 mg | ORAL_TABLET | ORAL | Status: DC

## 2021-07-23 MED ORDER — BOOST / RESOURCE BREEZE PO LIQD CUSTOM
1.0000 | Freq: Three times a day (TID) | ORAL | Status: DC
  Administered 2021-07-23 – 2021-07-27 (×8): 1 via ORAL

## 2021-07-23 MED ORDER — AMIODARONE HCL 200 MG PO TABS
200.0000 mg | ORAL_TABLET | Freq: Two times a day (BID) | ORAL | Status: DC
  Administered 2021-07-23 – 2021-07-28 (×11): 200 mg via ORAL
  Filled 2021-07-23 (×11): qty 1

## 2021-07-23 MED ORDER — METHOTREXATE 2.5 MG PO TABS: 20.0000 mg | ORAL_TABLET | ORAL | Status: DC

## 2021-07-23 MED ORDER — PREDNISONE 10 MG PO TABS: 5.0000 mg | ORAL_TABLET | Freq: Every day | ORAL | Status: DC

## 2021-07-23 MED ORDER — METHOTREXATE 2.5 MG PO TABS: 12.5000 mg | ORAL_TABLET | ORAL | Status: DC

## 2021-07-23 MED ORDER — PREDNISONE 10 MG PO TABS: 10.0000 mg | ORAL_TABLET | Freq: Every day | ORAL | Status: DC

## 2021-07-23 MED ORDER — METHOTREXATE 2.5 MG PO TABS
10.0000 mg | ORAL_TABLET | ORAL | Status: DC
  Administered 2021-07-23: 21:00:00 10 mg via ORAL
  Filled 2021-07-23: qty 4

## 2021-07-23 MED ORDER — POLYETHYLENE GLYCOL 3350 17 G PO PACK
17.0000 g | PACK | Freq: Every day | ORAL | Status: DC
  Administered 2021-07-23 – 2021-07-24 (×2): 17 g via ORAL
  Filled 2021-07-23 (×3): qty 1

## 2021-07-23 MED ORDER — PREDNISONE 2.5 MG PO TABS: 2.5000 mg | ORAL_TABLET | Freq: Every day | ORAL | Status: DC

## 2021-07-23 NOTE — Progress Notes (Addendum)
Progress Note  Patient Name: Robin Estes Date of Encounter: 07/23/2021  [facility] Cardiologist: Fransico Him, MD   Subjective   "I'm doing OK:"  Inpatient Medications    Scheduled Meds:  amiodarone  200 mg Oral BID   chlorhexidine  15 mL Mouth Rinse BID   Chlorhexidine Gluconate Cloth  6 each Topical Daily   docusate  100 mg Oral BID   famotidine  20 mg Oral Daily   feeding supplement  237 mL Oral BID BM   guaiFENesin  600 mg Oral BID   heparin  5,000 Units Subcutaneous Q8H   insulin aspart  0-15 Units Subcutaneous TID WC   insulin aspart  0-5 Units Subcutaneous QHS   mouth rinse  15 mL Mouth Rinse q12n4p   midodrine  10 mg Oral TID WC   polyethylene glycol  17 g Oral Daily   predniSONE  30 mg Oral Once   sodium chloride flush  10-40 mL Intracatheter Q12H   sodium chloride flush  3 mL Intravenous Q12H   sodium chloride flush  3 mL Intravenous Q12H   Continuous Infusions:  sodium chloride     cefTRIAXone (ROCEPHIN)  IV 1 g (07/23/21 [id number])   levETIRAcetam Stopped (07/23/21 0540)   PRN Meds: sodium chloride, acetaminophen, fentaNYL, midazolam, ondansetron (ZOFRAN) IV, sodium chloride flush, sodium chloride flush   Vital Signs    Vitals:   07/23/21 0800 07/23/21 0900 07/23/21 1000 07/23/21 1100  BP: 110/61 121/61 130/61 (!) 118/59  Pulse: 78 61 64 62  Resp: 19 (!) 22 (!) 32 17  Temp:      TempSrc:      SpO2: 98% 97% 97% 94%  Weight:      Height:        Intake/Output Summary (Last 24 hours) at 07/23/2021 1324 Last data filed at 07/23/2021 1000 Gross per 24 hour  Intake 804.04 ml  Output 700 ml  Net 104.04 ml   Last 3 Weights 07/23/2021 07/22/2021 07/20/2021  Weight (lbs) 143 lb 4.8 oz 143 lb 4.8 oz 138 lb 14.2 oz  Weight (kg) 65 kg 65 kg 63 kg      Telemetry    SR - Personally Reviewed  ECG    No new EKGs - Personally Reviewed  Physical Exam   GEN: No acute distress, appears somewhat chronically ill, thin body habitus Neck: No JVD   Cardiac: RRR, 3/6 SM, no rubs, or gallops.  Respiratory: CTA b/l. GI: Soft, nontender, non-distended  MS: No edema; No deformity. Neuro:  Nonfocal  Psych: Normal affect   Labs    High Sensitivity Troponin:   Recent Labs  Lab 07/17/21 2216 07/18/21 0015 07/18/21 0229 07/18/21 0429  TROPONINIHS 16 18* 18* 21*     Chemistry Recent Labs  Lab 07/17/21 2216 07/17/21 2227 07/18/21 0229 07/18/21 0248 07/19/21 1911 07/20/21 0315 07/21/21 0456 07/22/21 0256 07/22/21 1645 07/22/21 1659 07/22/21 1704 07/23/21 0520  NA 141   < > 140   < > 124*   < > 128* 133*   < > 143 143 138  K 2.8*   < > 3.1*   < > 4.1   < > 3.7 4.8   < > 3.3* 3.3* 3.9  CL 103   < > 104   < > 95*   < > 97* 103  --   --   --  104  CO2 27  --  21*   < > 19*   < > 24 26  --   --   --  29  GLUCOSE 113*   < > 133*   < > 439*   < > 287* 143*  --   --   --  89  BUN <5*   < > 6*   < > 15   < > 19 14  --   --   --  11  CREATININE 0.83   < > 0.93   < > 2.12*   < > 1.53* 1.11*  --   --   --  0.98  CALCIUM 8.2*  --  7.9*   < > 7.7*   < > 7.6* 7.9*  --   --   --  8.0*  MG  --   --  1.6*   < > 2.2  --  1.9 2.2  --   --   --   --   PROT 6.3*  --  5.8*  --   --   --   --   --   --   --   --   --   ALBUMIN 2.9*  --  2.7*  --   --   --   --   --   --   --   --   --   AST 41  --  56*  --   --   --   --   --   --   --   --   --   ALT 21  --  25  --   --   --   --   --   --   --   --   --   ALKPHOS 123  --  127*  --   --   --   --   --   --   --   --   --   BILITOT 1.3*  --  1.4*  --   --   --   --   --   --   --   --   --   GFRNONAA >60  --  >60   < > 24*   < > 35* 52*  --   --   --  60*  ANIONGAP 11  --  15   < > 10   < > 7 4*  --   --   --  5   < > = values in this interval not displayed.    Lipids No results for input(s): CHOL, TRIG, HDL, LABVLDL, LDLCALC, CHOLHDL in the last 168 hours.  Hematology Recent Labs  Lab 07/19/21 0541 07/20/21 0636 07/21/21 0856 07/22/21 1645 07/22/21 1659 07/22/21 1704 07/23/21 0520   WBC 9.7  --  13.2*  --   --   --  5.7  RBC 3.31*  --  2.98*  --   --   --  2.81*  HGB 9.7*   < > 8.7*   < > 7.5* 7.1* 8.4*  HCT 31.4*   < > 27.3*   < > 22.0* 21.0* 25.5*  MCV 94.9  --  91.6  --   --   --  90.7  MCH 29.3  --  29.2  --   --   --  29.9  MCHC 30.9  --  31.9  --   --   --  32.9  RDW 15.8*  --  15.3  --   --   --  16.1*  PLT 367  --  PLATELET CLUMPS NOTED ON  SMEAR, UNABLE TO ESTIMATE  --   --   --  200   < > = values in this interval not displayed.   Thyroid No results for input(s): TSH, FREET4 in the last 168 hours.  BNP Recent Labs  Lab 07/18/21 0230  BNP 893.2*    DDimer  Recent Labs  Lab 07/18/21 0229  DDIMER 5.27*     Radiology    CARDIAC CATHETERIZATION  Result Date: 07/22/2021 Findings: On NE 3 and DBA 5 Ao =  118/41 (67) LV =  121/9 RA =  2 RV = 52/4 PA = 56/6 (24) PCW = 7 Fick cardiac output/index = 11.2/6.6 PVR = 1.5 WU SVR = 463 FA sat = 99% PA sat = 82%, 82% SVC sat = 64% Assessment: 1. Normal coronary arteries 2. EF 45% by v-gram 3. High cardiac output with low SVR on DBA 5 and NE 3. No evidence of intracardiac shunting. Plan/Discussion: Wean DBA and NE. Increase midodrine. Await results cMRI. 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 Echo with EF 35-40%. Normal coronary arteries on cath EXAM: CARDIAC MRI TECHNIQUE: The patient was scanned on a 1.5 Tesla Siemens magnet. A dedicated cardiac coil was used. Functional imaging was done using Fiesta sequences. 2,3, and 4 chamber views were done to assess for RWMA's. Modified Simpson's rule using a short axis stack was used to calculate an ejection fraction on a dedicated work Conservation officer, nature. The patient received 7 cc of Gadavist. After 10 minutes inversion recovery sequences were used to assess for infiltration and scar tissue. CONTRAST:  7 cc  of Gadavist FINDINGS: Left  ventricle: -Normal size -Normal systolic function (on dobutamine and norepinephrine during study) -Elevated ECV (38%) -Elevated regional T2 values (up to 1ms in apical anterior wall) LV EF:  60% (Normal 56-78%) Absolute volumes: LV EDV: 166mL (Normal 52-141 mL) LV ESV: 50mL (Normal 13-51 mL) LV SV: 25mL (Normal 33-97 mL) CO: 5.2L/min (Normal 2.7-6.0 L/min) Indexed volumes: LV EDV: 52mL/sq-m (Normal 41-81 mL/sq-m) LV ESV: 2mL/sq-m (Normal 12-21 mL/sq-m) LV SV: 15mL/sq-m (Normal 26-56 mL/sq-m) CI: 3.0L/min/sq-m (Normal 1.8-3.8 L/min/sq-m) Right ventricle: Normal size and systolic function RV EF: 123456 (Normal 47-80%) Absolute volumes: RV EDV: 131mL (Normal 58-154 mL) RV ESV: 71mL (Normal 12-68 mL) RV SV: 58mL (Normal 35-98 mL) CO: 5.9L/min (Normal 2.7-6 L/min) Indexed volumes: RV EDV: 73mL/sq-m (Normal 48-87 mL/sq-m) RV ESV: 65mL/sq-m (Normal 11-28 mL/sq-m) RV SV: 72mL/sq-m (Normal 27-57 mL/sq-m) CI: 3.4L/min/sq-m (Normal 1.8-3.8 L/min/sq-m) Left atrium: Mild enlargement Right atrium: Normal size Mitral valve: Moderate regurgitation (regurgitant fraction 34%) Aortic valve: No regurgitation Tricuspid valve: Moderate regurgitation (regurgitant fraction 25%) Pulmonic valve: No regurgitation Aorta: Normal proximal ascending aorta Pericardium: Small effusion Extracardiac structures: Dense pulmonary consolidations, better characterized on CT chest 07/18/21 IMPRESSION: 1. Basal septal midwall late gadolinium enhancement, which is a nonspecific scar pattern seen in nonischemic cardiomyopathies. Given extracardiac sarcoid, would recommend cardiac PET to evaluate for cardiac sarcoid 2. Regional elevations in T2 values (along with T1 and ECV) suggesting myocardial edema. This could be seen in sarcoidosis or myocarditis, though lack of troponin elevation argues against myocarditis 3. Normal LV size and systolic function (EF 123456), though patient was on dobutamine and norepinephrine during study 4.  Normal RV size and systolic  function (EF 123456) 5.  Moderate mitral regurgitation (regurgitant fraction 34%) 6.  Moderate tricuspid regurgitation (regurgitant fraction 25%) 7.  Small pericardial effusion Electronically Signed   By: Oswaldo Milian M.D.   On: 07/22/2021 23:14    Cardiac Studies   ECHO 03/2019 EF 30-35% . Myoview - no ischemia. 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 RV normal. Minimal LGE -> doubt significant cardiac sarcoid - Echo 12/20 EF ~45% MV appears thickened and possible rheumatic with 3+ MR. Severe LAE. Mod TR.  - TEE 8/21: LVEF 40-45% w/ mild global HK moderate to severe functional central MR. - Echo 07/18/20 EF 35-40% Severe MR -Developed cardiogenic shock on 11/19 with co-ox 42% -CMRI LV EF 60% RV 60%. LVEF 60% RV 60% . LGE basal septal midwall. Nonspecific scar. Recommending cardiac PET.    cath Normal coronary arteries 2. EF 45% by v-gram  Patient Profile     75 y.o. female w/x of sarcoidosis (eye,lung, skin, biopsy proven), MAT, HTN, pulm nodules, chronic CHF (systolic), NICM  cMRI [id number] showed "Possible subtle mid-wall LGE in the mid inferior wall (not seen on long axis views). This could be consistent with cardiac sarcoidosis.  Admitted ot [facility] 07/17/21 with syncope, in the aiing room arrested, Vfib (CPR time approx 5 minutes; 1 dose of epi; she was successfully shocked out of it). Post ROSC she had MAT/Afib  K+ was 2.8, Mag 16     07/18/20 TTE EF 35-40% Severe MR 11/19 Head CT negative for acute process.  11/20 Extubated to 3 liters [place].  11/21 Dobutamine cut back to 5 mg tid and Norep weaning. Started on midodrine.  11/22 Norepi stopped and dobutamine cut back to 2.5 mcg.Give 40 mg IV lasix prior to cath. See below.  11/23 Dobutamine stopped and amiodarone gtt > PO 200mg  BID to place on po steroids with taper to MTX. Start PPI. PJP prophylaxis   CMRI- LVEF 60% RV 60% . LGE basal septal midwall. Nonspecific scar. Recommending cardiac PET.     Cath no  coronary disease, high output on norepi +dobutamine, and low SVR. EF 45%.   Labs K+ 3.9 BUN/Creat 11/0.98 WBC 5.7 H/H 8.4/25.5 (stable) Plts 200  On Rocephin, perhaps for abn UA  Assessment & Plan    PMVT/VF arrest  EP consulted, with NICM, sarcoid would need eventual ICD implant once clinical course allowed and close to discharge EP is recalled to case today in anticipation of discharge in the coming days Off gtts to re-evaluate for ICD implant  Pre-hospitalization lived independently without limitations  I will place her tentatively on our schedule for Friday  I have spoken to the patient and her daughter/step daughter at bedside rational for implant, the procedure, potential risks/benefits They are all agreeable  Dr. Lovena Le will see her later this afternoon  For questions or updates, please contact [facility] Please consult [email or web address] for contact info under     Signed, Baldwin [place], PA-C  07/23/2021, 1:24 PM    EP Attending  Patient seen and examined. Agree with the findings as noted above. 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 She has returned to baseline neuro function. She denies chest pain. Exam reveals a RRR and lungs are clear and ext with no edema. Tele with NSR. A/P VT/VF arrest - I have discussed the treatment options with the patient and recommended ICD insertion. She may ultimately require amiodarone. Non-ischemic CM - her CHF symptoms appear well compensated.   Carleene Overlie Latasha Buczkowski,MD

## 2021-07-23 NOTE — Progress Notes (Addendum)
Physical Therapy Treatment Patient Details Name: Robin Estes MRN: [id number] DOB: 1945-11-22 Today's Date: 07/23/2021   History of Present Illness Pt adm 11/18 with cardiac arrest with V-fib in ED. Pt intubated on 11/18. Extubated 11/20.  PMH - sarcoidosis, HTN, NICM, chf, legally blind rt eye    PT Comments    Pt making good progress with mobility and able to amb around part of unit with assist. Continue to feel she will be able to return home with some family assist.    Recommendations for follow up therapy are one component of a multi-disciplinary discharge planning process, led by the attending physician.  Recommendations may be updated based on patient status, additional functional criteria and insurance authorization.  Follow Up Recommendations  Home health PT     Assistance Recommended at Discharge Frequent or constant Supervision/Assistance  Equipment Recommendations  Rollator (4 wheels) (daughter says they can borrow equipment)    Recommendations for Other Services       Precautions / Restrictions Precautions Precautions: Fall Restrictions Weight Bearing Restrictions: No     Mobility  Bed Mobility            General bed mobility comments: Pt up in chair    Transfers Overall transfer level: Needs assistance Equipment used: Rollator (4 wheels) Transfers: Sit to/from Stand Sit to Stand: Min guard     Step pivot transfers: Min assist     General transfer comment: Assist for safety. No LE buckling    Ambulation/Gait Ambulation/Gait assistance: Min guard Gait Distance (Feet): 180 Feet Assistive device: Rollator (4 wheels) Gait Pattern/deviations: Step-through pattern;Decreased stride length Gait velocity: decr Gait velocity interpretation: 1.31 - 2.62 ft/sec, indicative of limited community ambulator   General Gait Details: assist for safety.   Stairs             Wheelchair Mobility    Modified Rankin (Stroke Patients Only)        Balance Overall balance assessment: Needs assistance Sitting-balance support: Feet supported;No upper extremity supported Sitting balance-Leahy Scale: Fair     Standing balance support: Bilateral upper extremity supported;Reliant on assistive device for balance Standing balance-Leahy Scale: Poor Standing balance comment: rollator and min guard for static standing                            Cognition Arousal/Alertness: Awake/alert Behavior During Therapy: WFL for tasks assessed/performed Overall Cognitive Status: Impaired/Different from baseline Area of Impairment: Problem solving;Safety/judgement;Following commands                       Following Commands: Follows multi-step commands with increased time Safety/Judgement: Decreased awareness of deficits   Problem Solving: Slow processing          Exercises      General Comments General comments (skin integrity, edema, etc.): VSS on RA. SpO2 94% after amb      Pertinent Vitals/Pain Pain Assessment: No/denies pain    Home Living Family/patient expects to be discharged to:: Private residence Living Arrangements: Spouse/significant other Available Help at Discharge: Family;Available 24 hours/day Type of Home: House Home Access: Stairs to enter   [employer] of Steps: 1   Home Layout: One level Home Equipment: None      Prior Function            PT Goals (current goals can now be found in the care plan section) Acute Rehab PT Goals Patient Stated Goal: back to dancing Progress  towards PT goals: Progressing toward goals    Frequency    Min 3X/week      PT Plan Current plan remains appropriate    Co-evaluation              AM-PAC PT "6 Clicks" Mobility   Outcome Measure  Help needed turning from your back to your side while in a flat bed without using bedrails?: A Little Help needed moving from lying on your back to sitting on the side of a flat bed without using  bedrails?: A Little Help needed moving to and from a bed to a chair (including a wheelchair)?: A Little Help needed standing up from a chair using your arms (e.g., wheelchair or bedside chair)?: A Little Help needed to walk in hospital room?: A Little Help needed climbing 3-5 steps with a railing? : A Little 6 Click Score: 18    End of Session Equipment Utilized During Treatment: Gait belt Activity Tolerance: Patient tolerated treatment well Patient left: in chair;with call bell/phone within reach;with chair alarm set;with family/visitor present Nurse Communication: Mobility status PT Visit Diagnosis: Unsteadiness on feet (R26.81);Other abnormalities of gait and mobility (R26.89);Muscle weakness (generalized) (M62.81)     Time: [id number] PT Time Calculation (min) (ACUTE ONLY): 30 min  Charges:  $Gait Training: 23-37 mins                     [place] Pager [phone] Office [place] 07/23/2021, 2:27 PM

## 2021-07-23 NOTE — Progress Notes (Signed)
Nutrition Follow-up  DOCUMENTATION CODES:   Non-severe (moderate) malnutrition in context of chronic illness  INTERVENTION:   Boost Breeze po TID, each supplement provides 250 kcal and 9 grams of protein.  Magic cup BID with meals, each supplement provides 290 kcal and 9 grams of protein  Liberalize diet to Regular  Daughter to bring in calorically dense snacks for patient such as trail mix, PB   NUTRITION DIAGNOSIS:   Moderate Malnutrition related to chronic illness as evidenced by mild fat depletion, mild muscle depletion.  Being addressed  GOAL:   Patient will meet greater than or equal to 90% of their needs  Progressing  MONITOR:   TF tolerance, Vent status, Labs, Weight trends  REASON FOR ASSESSMENT:   Consult, Ventilator    ASSESSMENT:   75 yo female admitted post VF arrest secondary to electrolyte abnormalities, possible cardiac sarcoidosis, intubated. PMH includes sarcoidosis (pulmonary, eye, skin), HTN, NICM with LVEF 40-45%  11/18 VF arrest 11/20 Extubated  Pt alert, sitting up in chair eating lunch on visit today.   Appetite remains poor; Recorded po intake only 25% of meals. Pt drinking some Ensure but does not really like. Pt tried [employer] which she really likes and drank the entire carton-plan to change Ensure Enlive to [employer]. Pt also likes orange sherbet; discussed Magic Cup-Orange Creamsicle flavor as an option as well; pt and daughter like this, RD to order BID  Daughter reports pt likes to snack on things like Peanut Butter Crackers and Trail mix; encouraged daughter to bring in from home as pt would likely benefit from these calorically dense snacks that we do not have available at present time. Encouraged pt to snack through out the day  Last BM 11/20; +flatus, drinking miralax on visit today  Labs: reviewed Meds: colace, miralax, ss novolog  Diet Order:  HEART HEALTHY   EDUCATION NEEDS:   Not appropriate for education at  this time  Skin:  Skin Assessment: Reviewed RN Assessment  Last BM:  11/20  Height:   Ht Readings from Last 1 Encounters:  07/18/21 5\' 4"  (1.626 m)    Weight:   Wt Readings from Last 1 Encounters:  07/23/21 65 kg    BMI:  Body mass index is 24.6 kg/m.  Estimated Nutritional Needs:   Kcal:  1500-1700 kcals  Protein:  90-115 g  Fluid:  >/= 1.5 L   07/25/21 MS, RDN, LDN, CNSC Registered Dietitian III Clinical Nutrition RD Pager and On-Call Pager Number Located in [place]

## 2021-07-23 NOTE — Progress Notes (Signed)
Transferred -in from  [facility] by  wheelchair   awake and alert. Made comfortable.

## 2021-07-23 NOTE — Progress Notes (Addendum)
Advanced Heart Failure Rounding Note   Subjective:    Echo 07/18/20 EF 35-40% Severe MR 11/19 Head CT negative for acute process.  11/20 Extubated to 3 liters [place].  11/21 Dobutamine cut back to 5 mg tid and Norep weaning. Started on midodrine.  11/22 Norepi stopped and dobutamine cut back to 2.5 mcg.Give 40 mg IV lasix prior to cath. See below.   CMRI- LVEF 60% RV 60% . LGE basal septal midwall. Nonspecific scar. Recommending cardiac PET.    Cath no coronary disease, high output on norepi +dobutamine, and low SVR. EF 45%.   On Dobutamine 2.5 mcg + amio drip. CO-OX stable   RHC/LHC with no coronary disease. Low filling pressures. High output on DBA + Noepi.    Feels good today. Denies SOB.    Objective:   Weight Range:  Vital Signs:   Temp:  [97.5 F (36.4 C)-97.8 F (36.6 C)] 97.7 F (36.5 C) (11/23 0645) Pulse Rate:  [25-95] 73 (11/23 0630) Resp:  [0-92] 28 (11/23 0630) BP: (53-214)/(32-164) 114/53 (11/23 0630) SpO2:  [0 %-100 %] 95 % (11/23 0630) Weight:  [65 kg] 65 kg (11/23 0500) Last BM Date: 07/20/21  Weight change: Filed Weights   07/20/21 0500 07/22/21 0500 07/23/21 0500  Weight: 63 kg 65 kg 65 kg    Intake/Output:   Intake/Output Summary (Last 24 hours) at 07/23/2021 0710 Last data filed at 07/23/2021 0600 Gross per 24 hour  Intake 1007.66 ml  Output 1875 ml  Net -867.34 ml    CVP 2  Physical Exam: General:  In bed. No resp difficulty HEENT: normal Neck: supple. no JVD. Carotids 2+ bilat; no bruits. No lymphadenopathy or thryomegaly appreciated. Cor: PMI nondisplaced. Regular rate & rhythm. No rubs, gallops or murmurs. Lungs: Coarse on room air.  Abdomen: soft, nontender, nondistended. No hepatosplenomegaly. No bruits or masses. Good bowel sounds. Extremities: no cyanosis, clubbing, rash, edema. RUE PICC  Neuro: alert & orientedx3, cranial nerves grossly intact. moves all 4 extremities w/o difficulty. Affect pleasant   Telemetry: SR  70-80s rare PVCs.    Labs: Basic Metabolic Panel: Recent Labs  Lab 07/18/21 1343 07/18/21 2204 07/19/21 0541 07/19/21 1039 07/19/21 1911 07/20/21 0315 07/20/21 0600 07/20/21 0636 07/21/21 0456 07/22/21 0256 07/22/21 1645 07/22/21 1650 07/22/21 1652 07/22/21 1659 07/22/21 1704 07/23/21 0520  NA 134*  --  126* 124* 124* 123* 128*   < > 128* 133*   < > 140 138 143 143 138  K 3.3*  --  5.1 4.2 4.1 3.4* 3.5   < > 3.7 4.8   < > 3.7 4.0 3.3* 3.3* 3.9  CL 102  --  96* 94* 95* 94* 98  --  97* 103  --   --   --   --   --  104  CO2 25  --  19* 19* 19* 20* 22  --  24 26  --   --   --   --   --  29  GLUCOSE 288*  --  445* 476* 439* 352* 234*  --  287* 143*  --   --   --   --   --  89  BUN 7*  --  12 12 15 16 18   --  19 14  --   --   --   --   --  11  CREATININE 0.90  --  1.64* 1.89* 2.12* 2.04* 2.00*  --  1.53* 1.11*  --   --   --   --   --  0.98  CALCIUM 8.0*  --  7.5* 7.6* 7.7* 7.5* 7.7*  --  7.6* 7.9*  --   --   --   --   --  8.0*  MG 2.6* 2.6* 2.4 2.4 2.2  --   --   --  1.9 2.2  --   --   --   --   --   --   PHOS 3.2 3.5 4.6  --  3.9  --   --   --   --   --   --   --   --   --   --   --    < > = values in this interval not displayed.    Liver Function Tests: Recent Labs  Lab 07/17/21 2216 07/18/21 0229  AST 41 56*  ALT 21 25  ALKPHOS 123 127*  BILITOT 1.3* 1.4*  PROT 6.3* 5.8*  ALBUMIN 2.9* 2.7*   No results for input(s): LIPASE, AMYLASE in the last 168 hours. No results for input(s): AMMONIA in the last 168 hours.  CBC: Recent Labs  Lab 07/17/21 2216 07/17/21 2227 07/18/21 0229 07/18/21 0248 07/19/21 0541 07/20/21 0636 07/21/21 0856 07/22/21 1645 07/22/21 1650 07/22/21 1652 07/22/21 1659 07/22/21 1704 07/23/21 0520  WBC 4.3  --  7.6  --  9.7  --  13.2*  --   --   --   --   --  5.7  NEUTROABS 3.2  --  4.9  --  8.7*  --   --   --   --   --   --   --   --   HGB 9.4*   < > 9.7*   < > 9.7*   < > 8.7*   < > 8.5* 8.8* 7.5* 7.1* 8.4*  HCT 30.4*   < > 31.0*   < >  31.4*   < > 27.3*   < > 25.0* 26.0* 22.0* 21.0* 25.5*  MCV 94.7  --  96.0  --  94.9  --  91.6  --   --   --   --   --  90.7  PLT 320  --  376  --  367  --  PLATELET CLUMPS NOTED ON SMEAR, UNABLE TO ESTIMATE  --   --   --   --   --  200   < > = values in this interval not displayed.    Cardiac Enzymes: No results for input(s): CKTOTAL, CKMB, CKMBINDEX, TROPONINI in the last 168 hours.  BNP: BNP (last 3 results) Recent Labs    04/18/21 1340 07/18/21 0230  BNP 124.9* 893.2*    ProBNP (last 3 results) No results for input(s): PROBNP in the last 8760 hours.    Other results:  Imaging: CARDIAC CATHETERIZATION  Result Date: 07/22/2021 Findings: On NE 3 and DBA 5 Ao =  118/41 (67) LV =  121/9 RA =  2 RV = 52/4 PA = 56/6 (24) PCW = 7 Fick cardiac output/index = 11.2/6.6 PVR = 1.5 WU SVR = 463 FA sat = 99% PA sat = 82%, 82% SVC sat = 64% Assessment: 1. Normal coronary arteries 2. EF 45% by v-gram 3. High cardiac output with low SVR on DBA 5 and NE 3. No evidence of intracardiac shunting. Plan/Discussion: Wean DBA and NE. Increase midodrine. Await results cMRI. Glori Bickers, MD 5:49 PM  MR CARDIAC MORPHOLOGY W WO CONTRAST  Result Date: 07/22/2021 CLINICAL DATA:  60F with extracardiac sarcoid who  presents with VF arrest. Echo with EF 35-40%. Normal coronary arteries on cath EXAM: CARDIAC MRI TECHNIQUE: The patient was scanned on a 1.5 Tesla Siemens magnet. A dedicated cardiac coil was used. Functional imaging was done using Fiesta sequences. 2,3, and 4 chamber views were done to assess for RWMA's. Modified Simpson's rule using a short axis stack was used to calculate an ejection fraction on a dedicated work Conservation officer, nature. The patient received 7 cc of Gadavist. After 10 minutes inversion recovery sequences were used to assess for infiltration and scar tissue. CONTRAST:  7 cc  of Gadavist FINDINGS: Left ventricle: -Normal size -Normal systolic function (on dobutamine and  norepinephrine during study) -Elevated ECV (38%) -Elevated regional T2 values (up to 59ms in apical anterior wall) LV EF:  60% (Normal 56-78%) Absolute volumes: LV EDV: 153mL (Normal 52-141 mL) LV ESV: 23mL (Normal 13-51 mL) LV SV: 19mL (Normal 33-97 mL) CO: 5.2L/min (Normal 2.7-6.0 L/min) Indexed volumes: LV EDV: 55mL/sq-m (Normal 41-81 mL/sq-m) LV ESV: 21mL/sq-m (Normal 12-21 mL/sq-m) LV SV: 59mL/sq-m (Normal 26-56 mL/sq-m) CI: 3.0L/min/sq-m (Normal 1.8-3.8 L/min/sq-m) Right ventricle: Normal size and systolic function RV EF: 123456 (Normal 47-80%) Absolute volumes: RV EDV: 163mL (Normal 58-154 mL) RV ESV: 78mL (Normal 12-68 mL) RV SV: 41mL (Normal 35-98 mL) CO: 5.9L/min (Normal 2.7-6 L/min) Indexed volumes: RV EDV: 31mL/sq-m (Normal 48-87 mL/sq-m) RV ESV: 51mL/sq-m (Normal 11-28 mL/sq-m) RV SV: 53mL/sq-m (Normal 27-57 mL/sq-m) CI: 3.4L/min/sq-m (Normal 1.8-3.8 L/min/sq-m) Left atrium: Mild enlargement Right atrium: Normal size Mitral valve: Moderate regurgitation (regurgitant fraction 34%) Aortic valve: No regurgitation Tricuspid valve: Moderate regurgitation (regurgitant fraction 25%) Pulmonic valve: No regurgitation Aorta: Normal proximal ascending aorta Pericardium: Small effusion Extracardiac structures: Dense pulmonary consolidations, better characterized on CT chest 07/18/21 IMPRESSION: 1. Basal septal midwall late gadolinium enhancement, which is a nonspecific scar pattern seen in nonischemic cardiomyopathies. Given extracardiac sarcoid, would recommend cardiac PET to evaluate for cardiac sarcoid 2. Regional elevations in T2 values (along with T1 and ECV) suggesting myocardial edema. This could be seen in sarcoidosis or myocarditis, though lack of troponin elevation argues against myocarditis 3. Normal LV size and systolic function (EF 123456), though patient was on dobutamine and norepinephrine during study 4.  Normal RV size and systolic function (EF 123456) 5.  Moderate mitral regurgitation (regurgitant fraction  34%) 6.  Moderate tricuspid regurgitation (regurgitant fraction 25%) 7.  Small pericardial effusion Electronically Signed   By: Oswaldo Milian M.D.   On: 07/22/2021 23:14     Medications:     Scheduled Medications:  chlorhexidine  15 mL Mouth Rinse BID   Chlorhexidine Gluconate Cloth  6 each Topical Daily   famotidine  20 mg Oral Daily   feeding supplement  237 mL Oral BID BM   guaiFENesin  600 mg Oral BID   heparin  5,000 Units Subcutaneous Q8H   insulin aspart  0-15 Units Subcutaneous TID WC   insulin aspart  0-5 Units Subcutaneous QHS   mouth rinse  15 mL Mouth Rinse q12n4p   midodrine  10 mg Oral TID WC   sodium chloride flush  10-40 mL Intracatheter Q12H   sodium chloride flush  3 mL Intravenous Q12H   sodium chloride flush  3 mL Intravenous Q12H    Infusions:  sodium chloride     amiodarone 30 mg/hr (07/23/21 0600)   cefTRIAXone (ROCEPHIN)  IV Stopped (07/22/21 1146)   DOBUTamine 2.5 mcg/kg/min (07/23/21 0600)   levETIRAcetam Stopped (07/23/21 0540)   norepinephrine (LEVOPHED) Adult infusion Stopped (07/23/21  0116)    PRN Medications: sodium chloride, acetaminophen, docusate, fentaNYL, midazolam, ondansetron (ZOFRAN) IV, polyethylene glycol, sodium chloride flush, sodium chloride flush   Assessment/Plan:   1. 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 Lido now off - Stop amio drip and start amio 200 mg twice a day.  - Keep K > 4.0 and Mg > 2.0 - Cath with coronary disease. CMRI wit LGE possible sarcoid. Need cardiac PET as an outpatient.  - Completed steroids.  - I discussed with EP. Plan on ICD prior to d/c. Possible implant Friday.    2. Chronic Systolic Heart Failure -> cardiogenic shock  - ECHO 03/2019 EF 30-35% . Myoview - no ischemia. Suspect NICM (?related to MAT) - cMRI 8/20 with EF  45%. RV normal. Minimal LGE -> doubt significant cardiac sarcoid - Echo 12/20 EF ~45% MV appears thickened and possible rheumatic with 3+ MR. Severe LAE. Mod TR.  - TEE 8/21: LVEF 40-45% w/ mild global HK moderate to severe functional central MR. - Echo 07/18/20 EF 35-40% Severe MR -Developed cardiogenic shock on 11/19 with co-ox 42% -CMRI LV EF 60% RV 60%. LVEF 60% RV 60% . LGE basal septal midwall. Nonspecific scar. Recommending cardiac PET.   - CVP 2. No diuretics.  -   CO-OX 74%. Stop dobutamine.  - Continue 10  mg midodrine tid.  - Holding GDMT with arrest and shock   3. Acute hypoxic respiratory failure in setting of #1 - Extubated 07/20/21 to 3 liters. - Stable on room air. ir.   4. MAT - Zio in 9/20       1. Sinus rhythm - avg HR of 82      2. 22 Supraventricular Tachycardia runs occurred, the run with the fastest interval lasting 5 beats with a max rate of 184 bpm, the longest lasting 10.9 secs with an avg rate of 162 bpm.     3. Isolated PACs were occasional (4.7%, 18936)     4. Rare PVCs (<1.0%) - currently in NSR  5. 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 - She had been referred for possible [employer]. Did not keep appointment last fall due to gap in insurance coverage - Severe MR on echo but moderate on CMRI    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 Loanne Drilling in Pulmonary. Sarcoid not felt to be active previously - Plan as above.  - Completed steroids.  - MRI -EF 60% normal RV. LGE basal septal midwall. Nonspecific scar. Recommending cardiac PET.   - Cardiac Pet after D/C   7. Hypokalemia/hypomagnesemia - As above replace K and Mag.   8. AKI due to ATN/shock - baseline SCr  0.9 - Resolved.   9. Hyperglycemia - likely steroid-induced  Transfer to [place].   Length of Stay: 5   Amy Clegg NP-C  07/23/2021, 7:10 AM  Advanced Heart Failure Team Pager [phone] (M-F; 7a - 4p)  Please contact  [facility] for night-coverage after hours (4p -7a )   Patient seen and examined with the above-signed Advanced Practice Provider and/or Housestaff. I personally reviewed laboratory data, imaging studies and relevant notes. I independently examined the patient and formulated the important aspects of the plan. I have edited the note to reflect any of my changes or salient points.  I have personally discussed the plan with the patient and/or family.  NE stopped overnight. Remains on DBA. Co-ox 74% CPV 2. No further VT on IV amio.   Reviewed cath results and cMRI results with her. EF normal on NE and DBA. Concern for cardiac sarcoid.   General:  Weak appearing. No resp difficulty HEENT: normal + sarcoid skin changes Neck: supple. no JVD. Carotids 2+ bilat; no bruits. No lymphadenopathy or thryomegaly appreciated. Cor: PMI nondisplaced. Regular rate & rhythm. No rubs, gallops or murmurs. Lungs: clear Abdomen: soft, nontender, nondistended. No hepatosplenomegaly. No bruits or masses. Good bowel sounds. Extremities: no cyanosis, clubbing, rash, edema Neuro: alert & orientedx3, cranial nerves grossly intact. moves all 4 extremities w/o difficulty. Affect pleasant  She has normal coronaries on cath. cMRI suggestive of cardiac sarcoidosis. Outputs ok. Would stop DBA. Switch amio to po.   Will need to place on po steroids with taper to MTX. Start PPI. PJP prophylaxis.   D/w EP. Plan ICD Friday.   CRITICAL CARE Performed by: Glori Bickers  Total critical care time: 35 minutes  Critical care time was exclusive of separately billable procedures and treating other patients.  Critical care was necessary to treat or prevent imminent or life-threatening deterioration.  Critical care was time spent personally by me (independent of midlevel providers or residents) on the following activities: development of treatment plan with patient and/or surrogate as well as nursing, discussions with  consultants, evaluation of patient's response to treatment, examination of patient, obtaining history from patient or surrogate, ordering and performing treatments and interventions, ordering and review of laboratory studies, ordering and review of radiographic studies, pulse oximetry and re-evaluation of patient's condition.  Glori Bickers, MD  8:23 AM

## 2021-07-23 NOTE — Evaluation (Signed)
Occupational Therapy Evaluation Patient Details Name: Robin Estes MRN: [id number] DOB: 01-Jun-1946 Today's Date: 07/23/2021   History of Present Illness Pt adm 11/18 with cardiac arrest with V-fib in ED. Pt intubated on 11/18. Extubated 11/20.  PMH - sarcoidosis, HTN, NICM, chf, legally blind rt eye   Clinical Impression   Patient admitted for the diagnosis above.  Deficits impacting independence are listed below.  Currently she is needing up to Min A for basic mobility, and up to Mod A for lower body ADL from a seated level.  OT to follow in the acute setting, she will have assist as needed at home from family, and home, 24 hour assist as needed and HH OT is recommended.        Recommendations for follow up therapy are one component of a multi-disciplinary discharge planning process, led by the attending physician.  Recommendations may be updated based on patient status, additional functional criteria and insurance authorization.   Follow Up Recommendations  Home health OT    Assistance Recommended at Discharge Frequent or constant Supervision/Assistance  Functional Status Assessment  Patient has had a recent decline in their functional status and demonstrates the ability to make significant improvements in function in a reasonable and predictable amount of time.  Equipment Recommendations  Tub/shower seat    Recommendations for Other Services       Precautions / Restrictions Precautions Precautions: Fall Restrictions Weight Bearing Restrictions: No      Mobility Bed Mobility Overal bed mobility: Needs Assistance Bed Mobility: Supine to Sit     Supine to sit: Min guard;HOB elevated          Transfers   Equipment used: Rolling walker (2 wheels) Transfers: Sit to/from Stand;Bed to chair/wheelchair/BSC Sit to Stand: Min assist     Step pivot transfers: Min assist     General transfer comment: knee buckling noted      Balance Overall balance assessment:  Needs assistance Sitting-balance support: Feet supported;No upper extremity supported Sitting balance-Leahy Scale: Fair     Standing balance support: Bilateral upper extremity supported;Reliant on assistive device for balance Standing balance-Leahy Scale: Poor Standing balance comment: leaning back                           ADL either performed or assessed with clinical judgement   ADL       Grooming: Wash/dry hands;Wash/dry face;Min guard;Sitting   Upper Body Bathing: Minimal assistance;Sitting       Upper Body Dressing : Minimal assistance;Sitting   Lower Body Dressing: Moderate assistance;Sitting/lateral leans   Toilet Transfer: Minimal assistance;BSC/3in1;Stand-pivot;Rolling walker (2 wheels)                   Vision Baseline Vision/History: 2 Legally blind Patient Visual Report: No change from baseline Additional Comments: blind to the R eye     Perception Perception Perception: Not tested   Praxis Praxis Praxis: Not tested    Pertinent Vitals/Pain Pain Assessment: No/denies pain     Hand Dominance Right   Extremity/Trunk Assessment Upper Extremity Assessment Upper Extremity Assessment: Generalized weakness   Lower Extremity Assessment Lower Extremity Assessment: Generalized weakness   Cervical / Trunk Assessment Cervical / Trunk Assessment: Normal   Communication Communication Communication: No difficulties   Cognition Arousal/Alertness: Awake/alert Behavior During Therapy: WFL for tasks assessed/performed Overall Cognitive Status: Impaired/Different from baseline Area of Impairment: Problem solving;Safety/judgement;Following commands  Following Commands: Follows one step commands with increased time Safety/Judgement: Decreased awareness of deficits   Problem Solving: Slow processing;Requires verbal cues;Difficulty sequencing       General Comments       Exercises     Shoulder  Instructions      Home Living Family/patient expects to be discharged to:: Private residence Living Arrangements: Spouse/significant other Available Help at Discharge: Family;Available 24 hours/day Type of Home: House Home Access: Stairs to enter [employer] of Steps: 1   Home Layout: One level     Bathroom Shower/Tub: Chief Strategy Officer: Standard     Home Equipment: None          Prior Functioning/Environment Prior Level of Function : Independent/Modified Independent             Mobility Comments: No assistive device ADLs Comments: No assist for ADL        OT Problem List: Decreased strength;Decreased activity tolerance;Impaired balance (sitting and/or standing);Decreased knowledge of use of DME or AE;Decreased safety awareness;Decreased cognition      OT Treatment/Interventions: Self-care/ADL training;Therapeutic exercise;Therapeutic activities;DME and/or AE instruction;Patient/family education;Balance training;Cognitive remediation/compensation    OT Goals(Current goals can be found in the care plan section) Acute Rehab OT Goals Patient Stated Goal: None stated OT Goal Formulation: With patient Time For Goal Achievement: 08/06/21 Potential to Achieve Goals: Good ADL Goals Pt Will Perform Grooming: with set-up;standing Pt Will Perform Upper Body Bathing: with set-up;sitting Pt Will Perform Upper Body Dressing: with set-up;sitting Pt Will Transfer to Toilet: with supervision;ambulating;regular height toilet Pt Will Perform Toileting - Clothing Manipulation and hygiene: with modified independence;sit to/from stand Pt/caregiver will Perform Home Exercise Program: Increased strength;Both right and left upper extremity;With theraband;With written HEP provided;With minimal assist  OT Frequency: Min 2X/week   Barriers to D/C:    none noted       Co-evaluation              AM-PAC OT "6 Clicks" Daily Activity     Outcome Measure  Help from another person eating meals?: A Little Help from another person taking care of personal grooming?: A Little Help from another person toileting, which includes using toliet, bedpan, or urinal?: A Little Help from another person bathing (including washing, rinsing, drying)?: A Lot Help from another person to put on and taking off regular upper body clothing?: A Little Help from another person to put on and taking off regular lower body clothing?: A Lot 6 Click Score: 16   End of Session Equipment Utilized During Treatment: Gait belt;Rolling walker (2 wheels) Nurse Communication: Mobility status  Activity Tolerance: Patient tolerated treatment well Patient left: in chair;with call bell/phone within reach;with chair alarm set;with family/visitor present  OT Visit Diagnosis: Unsteadiness on feet (R26.81);Muscle weakness (generalized) (M62.81);Other symptoms and signs involving cognitive function                Time: 1141-1158 OT Time Calculation (min): 17 min Charges:  OT General Charges $OT Visit: 1 Visit OT Evaluation $OT Eval Moderate Complexity: 1 Mod  07/23/2021  RP, OTR/L  Acute Rehabilitation Services  Office:  [phone]   Robin Estes 07/23/2021, 12:15 PM

## 2021-07-23 NOTE — Progress Notes (Signed)
Hypoglycemic Event  CBG: 61   Treatment: 8 oz juice/soda  Symptoms: None  Follow-up CBG: Time:1658 CBG Result:86   Possible Reasons for Event: Inadequate meal intake  Comments/MD notified NP  made aware with order    Arvella Merles I

## 2021-07-24 LAB — BASIC METABOLIC PANEL
Anion gap: 6 (ref 5–15)
BUN: 13 mg/dL (ref 8–23)
CO2: 27 mmol/L (ref 22–32)
Calcium: 8.2 mg/dL — ABNORMAL LOW (ref 8.9–10.3)
Chloride: 103 mmol/L (ref 98–111)
Creatinine, Ser: 1.01 mg/dL — ABNORMAL HIGH (ref 0.44–1.00)
GFR, Estimated: 58 mL/min — ABNORMAL LOW (ref >60)
Glucose, Bld: 106 mg/dL — ABNORMAL HIGH (ref 70–99)
Potassium: 4.1 mmol/L (ref 3.5–5.1)
Sodium: 136 mmol/L (ref 135–145)

## 2021-07-24 LAB — GLUCOSE, CAPILLARY
Glucose-Capillary: 96 mg/dL (ref 70–99)
Glucose-Capillary: 137 mg/dL — ABNORMAL HIGH (ref 70–99)
Glucose-Capillary: 135 mg/dL — ABNORMAL HIGH (ref 70–99)
Glucose-Capillary: 129 mg/dL — ABNORMAL HIGH (ref 70–99)

## 2021-07-24 LAB — COOXEMETRY PANEL
Carboxyhemoglobin: 1.4 % (ref 0.5–1.5)
Methemoglobin: 0.8 % (ref 0.0–1.5)
O2 Saturation: 77.9 %
Total hemoglobin: 9.3 g/dL — ABNORMAL LOW (ref 12.0–16.0)

## 2021-07-24 LAB — MAGNESIUM: Magnesium: 1.9 mg/dL (ref 1.7–2.4)

## 2021-07-24 MED ORDER — SODIUM CHLORIDE 0.9 % IV SOLN
80.0000 mg | INTRAVENOUS | Status: AC
  Administered 2021-07-25: 80 mg
  Filled 2021-07-24: qty 2

## 2021-07-24 MED ORDER — SPIRONOLACTONE 12.5 MG HALF TABLET
12.5000 mg | ORAL_TABLET | Freq: Every day | ORAL | Status: DC
  Administered 2021-07-24 – 2021-07-27 (×4): 12.5 mg via ORAL
  Filled 2021-07-24 (×4): qty 1

## 2021-07-24 MED ORDER — CEFAZOLIN SODIUM-DEXTROSE 2-4 GM/100ML-% IV SOLN
2.0000 g | INTRAVENOUS | Status: AC
  Administered 2021-07-25: 2 g via INTRAVENOUS
  Filled 2021-07-24: qty 100

## 2021-07-24 MED ORDER — MAGNESIUM SULFATE 2 GM/50ML IV SOLN
2.0000 g | Freq: Once | INTRAVENOUS | Status: AC
  Administered 2021-07-24: 2 g via INTRAVENOUS
  Filled 2021-07-24: qty 50

## 2021-07-24 MED ORDER — SODIUM CHLORIDE 0.9 % IV SOLN: INTRAVENOUS | Status: DC

## 2021-07-24 MED ORDER — SACUBITRIL-VALSARTAN 24-26 MG PO TABS
1.0000 | ORAL_TABLET | Freq: Two times a day (BID) | ORAL | Status: DC
  Administered 2021-07-24 – 2021-07-25 (×3): 1 via ORAL
  Filled 2021-07-24 (×4): qty 1

## 2021-07-24 MED ORDER — SODIUM CHLORIDE 0.9% FLUSH: 3.0000 mL | INTRAVENOUS | Status: DC | PRN

## 2021-07-24 MED ORDER — CHLORHEXIDINE GLUCONATE 4 % EX LIQD
60.0000 mL | Freq: Once | CUTANEOUS | Status: AC
  Administered 2021-07-25: 4 via TOPICAL
  Filled 2021-07-24 (×2): qty 60

## 2021-07-24 MED ORDER — CHLORHEXIDINE GLUCONATE 4 % EX LIQD
60.0000 mL | Freq: Once | CUTANEOUS | Status: AC
  Administered 2021-07-24: 4 via TOPICAL
  Filled 2021-07-24: qty 60

## 2021-07-24 MED ORDER — SODIUM CHLORIDE 0.9 % IV SOLN: 250.0000 mL | INTRAVENOUS | Status: DC

## 2021-07-24 NOTE — Progress Notes (Signed)
Mobility Specialist Progress Note:   07/24/21 0923  Mobility  Activity Ambulated in hall  Level of Assistance Standby assist, set-up cues, supervision of patient - no hands on  Assistive Device Front wheel walker  Distance Ambulated (ft) 150 ft  Mobility Ambulated with assistance in hallway  Mobility Response Tolerated well  Mobility performed by Mobility specialist  Bed Position Chair  $Mobility charge 1 Mobility   Pt received in chair willing to participate in mobility. During ambulation pt stated her legs felt sore. Returned to bed with call bell in reach, all needs met, and family present.   [facility] Public librarian Phone [phone] Secondary Phone [phone]

## 2021-07-24 NOTE — Progress Notes (Addendum)
Advanced Heart Failure Rounding Note   Subjective:    Echo 07/18/20 EF 35-40% Severe MR 11/19 Head CT negative for acute process.  11/20 Extubated to 3 liters [place].  11/21 Dobutamine cut back to 5 mg tid and Norep weaning. Started on midodrine.  11/22 Norepi stopped and dobutamine cut back to 2.5 mcg.Give 40 mg IV lasix prior to cath. See below.   CMRI- LVEF 60% RV 60% . LGE basal septal midwall. Nonspecific scar. Recommending cardiac PET.    Cath no coronary disease, high output on norepi +dobutamine, and low SVR. EF 45%.   RHC/LHC with no coronary disease. Low filling pressures. High output on DBA + Noepi.   Off pressors. SBP 120s. Co-ox 78%  Denies CP or SOB. Rhythm stable   Objective:   Weight Range:  Vital Signs:   Temp:  [97 F (36.1 C)-98.5 F (36.9 C)] 98.5 F (36.9 C) (11/24 0431) Pulse Rate:  [62-74] 67 (11/24 0431) Resp:  [15-28] 15 (11/24 0431) BP: (118-125)/(59-63) 123/63 (11/24 0431) SpO2:  [92 %-100 %] 100 % (11/24 0431) Weight:  [64.3 kg] 64.3 kg (11/24 0431) Last BM Date: 07/24/21  Weight change: Filed Weights   07/22/21 0500 07/23/21 0500 07/24/21 0431  Weight: 65 kg 65 kg 64.3 kg    Intake/Output:   Intake/Output Summary (Last 24 hours) at 07/24/2021 1023 Last data filed at 07/24/2021 0546 Gross per 24 hour  Intake 106 ml  Output 600 ml  Net -494 ml      Physical Exam: General:  Sitting up. No resp difficulty HEENT: normal + sarcoid skin changes Neck: supple. no JVD. Carotids 2+ bilat; no bruits. No lymphadenopathy or thryomegaly appreciated. Cor: PMI nondisplaced. Regular rate & rhythm. No rubs, gallops or murmurs. Lungs: clear Abdomen: soft, nontender, nondistended. No hepatosplenomegaly. No bruits or masses. Good bowel sounds. Extremities: no cyanosis, clubbing, rash, edema Neuro: alert & orientedx3, cranial nerves grossly intact. moves all 4 extremities w/o difficulty. Affect pleasant   Telemetry: SR 60-70s rare PVCs.  Personally reviewed    Labs: Basic Metabolic Panel: Recent Labs  Lab 07/18/21 1343 07/18/21 2204 07/19/21 0541 07/19/21 1039 07/19/21 1911 07/20/21 0315 07/20/21 0600 07/20/21 0636 07/21/21 0456 07/22/21 0256 07/22/21 1645 07/22/21 1652 07/22/21 1659 07/22/21 1704 07/23/21 0520 07/24/21 0500  NA 134*  --  126* 124* 124*   < > 128*   < > 128* 133*   < > 138 143 143 138 136  K 3.3*  --  5.1 4.2 4.1   < > 3.5   < > 3.7 4.8   < > 4.0 3.3* 3.3* 3.9 4.1  CL 102  --  96* 94* 95*   < > 98  --  97* 103  --   --   --   --  104 103  CO2 25  --  19* 19* 19*   < > 22  --  24 26  --   --   --   --  29 27  GLUCOSE 288*  --  445* 476* 439*   < > 234*  --  287* 143*  --   --   --   --  89 106*  BUN 7*  --  12 12 15    < > 18  --  19 14  --   --   --   --  11 13  CREATININE 0.90  --  1.64* 1.89* 2.12*   < > 2.00*  --  1.53*  1.11*  --   --   --   --  0.98 1.01*  CALCIUM 8.0*  --  7.5* 7.6* 7.7*   < > 7.7*  --  7.6* 7.9*  --   --   --   --  8.0* 8.2*  MG 2.6* 2.6* 2.4 2.4 2.2  --   --   --  1.9 2.2  --   --   --   --   --  1.9  PHOS 3.2 3.5 4.6  --  3.9  --   --   --   --   --   --   --   --   --   --   --    < > = values in this interval not displayed.     Liver Function Tests: Recent Labs  Lab 07/17/21 2216 07/18/21 0229  AST 41 56*  ALT 21 25  ALKPHOS 123 127*  BILITOT 1.3* 1.4*  PROT 6.3* 5.8*  ALBUMIN 2.9* 2.7*    No results for input(s): LIPASE, AMYLASE in the last 168 hours. No results for input(s): AMMONIA in the last 168 hours.  CBC: Recent Labs  Lab 07/17/21 2216 07/17/21 2227 07/18/21 0229 07/18/21 0248 07/19/21 0541 07/20/21 0636 07/21/21 0856 07/22/21 1645 07/22/21 1650 07/22/21 1652 07/22/21 1659 07/22/21 1704 07/23/21 0520  WBC 4.3  --  7.6  --  9.7  --  13.2*  --   --   --   --   --  5.7  NEUTROABS 3.2  --  4.9  --  8.7*  --   --   --   --   --   --   --   --   HGB 9.4*   < > 9.7*   < > 9.7*   < > 8.7*   < > 8.5* 8.8* 7.5* 7.1* 8.4*  HCT 30.4*   < >  31.0*   < > 31.4*   < > 27.3*   < > 25.0* 26.0* 22.0* 21.0* 25.5*  MCV 94.7  --  96.0  --  94.9  --  91.6  --   --   --   --   --  90.7  PLT 320  --  376  --  367  --  PLATELET CLUMPS NOTED ON SMEAR, UNABLE TO ESTIMATE  --   --   --   --   --  200   < > = values in this interval not displayed.     Cardiac Enzymes: No results for input(s): CKTOTAL, CKMB, CKMBINDEX, TROPONINI in the last 168 hours.  BNP: BNP (last 3 results) Recent Labs    04/18/21 1340 07/18/21 0230  BNP 124.9* 893.2*     ProBNP (last 3 results) No results for input(s): PROBNP in the last 8760 hours.    Other results:  Imaging: CARDIAC CATHETERIZATION  Result Date: 07/22/2021 Findings: On NE 3 and DBA 5 Ao =  118/41 (67) LV =  121/9 RA =  2 RV = 52/4 PA = 56/6 (24) PCW = 7 Fick cardiac output/index = 11.2/6.6 PVR = 1.5 WU SVR = 463 FA sat = 99% PA sat = 82%, 82% SVC sat = 64% Assessment: 1. Normal coronary arteries 2. EF 45% by v-gram 3. High cardiac output with low SVR on DBA 5 and NE 3. No evidence of intracardiac shunting. Plan/Discussion: Wean DBA and NE. Increase midodrine. Await results cMRI. 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 Echo with EF 35-40%. Normal coronary arteries on cath EXAM: CARDIAC MRI TECHNIQUE: The patient was scanned on a 1.5 Tesla Siemens magnet. A dedicated cardiac coil was used. Functional imaging was done using Fiesta sequences. 2,3, and 4 chamber views were done to assess for RWMA's. Modified Simpson's rule using a short axis stack was used to calculate an ejection fraction on a dedicated work Conservation officer, nature. The patient received 7 cc of Gadavist. After 10 minutes inversion recovery sequences were used to assess for infiltration and scar tissue. CONTRAST:  7 cc  of Gadavist FINDINGS: Left ventricle: -Normal size -Normal systolic function (on  dobutamine and norepinephrine during study) -Elevated ECV (38%) -Elevated regional T2 values (up to 75ms in apical anterior wall) LV EF:  60% (Normal 56-78%) Absolute volumes: LV EDV: 136mL (Normal 52-141 mL) LV ESV: 29mL (Normal 13-51 mL) LV SV: 45mL (Normal 33-97 mL) CO: 5.2L/min (Normal 2.7-6.0 L/min) Indexed volumes: LV EDV: 46mL/sq-m (Normal 41-81 mL/sq-m) LV ESV: 89mL/sq-m (Normal 12-21 mL/sq-m) LV SV: 69mL/sq-m (Normal 26-56 mL/sq-m) CI: 3.0L/min/sq-m (Normal 1.8-3.8 L/min/sq-m) Right ventricle: Normal size and systolic function RV EF: 123456 (Normal 47-80%) Absolute volumes: RV EDV: 112mL (Normal 58-154 mL) RV ESV: 13mL (Normal 12-68 mL) RV SV: 42mL (Normal 35-98 mL) CO: 5.9L/min (Normal 2.7-6 L/min) Indexed volumes: RV EDV: 23mL/sq-m (Normal 48-87 mL/sq-m) RV ESV: 21mL/sq-m (Normal 11-28 mL/sq-m) RV SV: 92mL/sq-m (Normal 27-57 mL/sq-m) CI: 3.4L/min/sq-m (Normal 1.8-3.8 L/min/sq-m) Left atrium: Mild enlargement Right atrium: Normal size Mitral valve: Moderate regurgitation (regurgitant fraction 34%) Aortic valve: No regurgitation Tricuspid valve: Moderate regurgitation (regurgitant fraction 25%) Pulmonic valve: No regurgitation Aorta: Normal proximal ascending aorta Pericardium: Small effusion Extracardiac structures: Dense pulmonary consolidations, better characterized on CT chest 07/18/21 IMPRESSION: 1. Basal septal midwall late gadolinium enhancement, which is a nonspecific scar pattern seen in nonischemic cardiomyopathies. Given extracardiac sarcoid, would recommend cardiac PET to evaluate for cardiac sarcoid 2. Regional elevations in T2 values (along with T1 and ECV) suggesting myocardial edema. This could be seen in sarcoidosis or myocarditis, though lack of troponin elevation argues against myocarditis 3. Normal LV size and systolic function (EF 123456), though patient was on dobutamine and norepinephrine during study 4.  Normal RV size and systolic function (EF 123456) 5.  Moderate mitral regurgitation  (regurgitant fraction 34%) 6.  Moderate tricuspid regurgitation (regurgitant fraction 25%) 7.  Small pericardial effusion Electronically Signed   By: Oswaldo Milian M.D.   On: 07/22/2021 23:14     Medications:     Scheduled Medications:  amiodarone  200 mg Oral BID   calcium citrate  200 mg of elemental calcium Oral BID WC   chlorhexidine  15 mL Mouth Rinse BID   Chlorhexidine Gluconate Cloth  6 each Topical Daily   cholecalciferol  1,000 Units Oral Daily   docusate  100 mg Oral BID   famotidine  20 mg Oral Daily   feeding supplement  1 Container Oral TID BM   folic acid  1 mg Oral Daily   guaiFENesin  600 mg Oral BID   heparin  5,000 Units Subcutaneous Q8H   insulin aspart  0-15 Units Subcutaneous TID WC   mouth rinse  15 mL Mouth Rinse q12n4p   methotrexate  10 mg Oral Weekly   Followed by   Derrill Memo ON 08/06/2021] methotrexate  12.5 mg Oral Weekly   Followed by   Derrill Memo ON  08/20/2021] methotrexate  15 mg Oral Weekly   Followed by   Derrill Memo ON 09/03/2021] methotrexate  17.5 mg Oral Weekly   Followed by   Derrill Memo ON 09/17/2021] methotrexate  20 mg Oral Weekly   midodrine  10 mg Oral TID WC   pantoprazole  20 mg Oral Daily   polyethylene glycol  17 g Oral Daily   predniSONE  30 mg Oral Q breakfast   Followed by   Derrill Memo ON 08/20/2021] predniSONE  25 mg Oral Q breakfast   Followed by   Derrill Memo ON 09/17/2021] predniSONE  20 mg Oral Q breakfast   Followed by   Derrill Memo ON 10/15/2021] predniSONE  15 mg Oral Q breakfast   Followed by   Derrill Memo ON 11/12/2021] predniSONE  10 mg Oral Q breakfast   Followed by   Derrill Memo ON 11/26/2021] predniSONE  7.5 mg Oral Q breakfast   Followed by   Derrill Memo ON 12/10/2021] predniSONE  5 mg Oral Q breakfast   Followed by   Derrill Memo ON 12/24/2021] predniSONE  2.5 mg Oral Q breakfast   sodium chloride flush  10-40 mL Intracatheter Q12H   sodium chloride flush  3 mL Intravenous Q12H   sodium chloride flush  3 mL Intravenous Q12H   sodium chloride flush  3 mL  Intravenous Q12H   sulfamethoxazole-trimethoprim  1 tablet Oral Once per day on Mon Wed Fri    Infusions:  sodium chloride     cefTRIAXone (ROCEPHIN)  IV 1 g (07/23/21 [id number])   levETIRAcetam 500 mg (07/24/21 0546)    PRN Medications: sodium chloride, acetaminophen, fentaNYL, midazolam, ondansetron (ZOFRAN) IV, sodium chloride flush, sodium chloride flush   Assessment/Plan:   1. 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 Lido now off - Continue amio 200 mg twice a day.  - Keep K > 4.0 and Mg > 2.0 - Cath without coronary disease. cMRI wit LGE possible sarcoid. Need cardiac PET as an outpatient.  - Plan ICD tomorrow - Continue steroid protocol.    2. 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 RV normal. Minimal LGE -> doubt significant cardiac sarcoid - Echo 12/20 EF ~45% MV appears thickened and possible rheumatic with 3+ MR. Severe LAE. Mod TR.  - TEE 8/21: LVEF 40-45% w/ mild global HK moderate to severe functional central MR. - Echo 07/18/20 EF 35-40% Severe MR -Developed cardiogenic shock on 11/19 with co-ox 42% -CMRI LV EF 60% RV 60%. LVEF 60% RV 60% . LGE basal septal midwall. Nonspecific scar. Recommending cardiac PET.   - Off inotropes. Co-ox 78% volume status ok  - restart spiro 12.5 and  Entresto at low dose - Has been unable to tolerate [place]   3. Acute hypoxic respiratory failure in setting of #1 - Extubated 07/20/21 to 3 liters. - Stable on RA  4. MAT - Zio in 9/20       1. Sinus rhythm - avg HR of 82      2. 22 Supraventricular Tachycardia runs occurred, the run with the fastest interval lasting 5 beats with a max rate of 184 bpm, the longest lasting 10.9 secs with an avg rate of 162 bpm.     3. Isolated PACs were occasional (4.7%, 18936)     4. Rare PVCs (<1.0%) - currently in NSR  5. Mitral  regurgitaion -  TEE 8/21 showed moderate to severe functional central MR. - She had been referred for possible [employer]. Did not keep appointment last fall due to gap in insurance coverage - Severe MR on echo but moderate on cMRI    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 Loanne Drilling in Pulmonary. - Plan as above. Steroids started - MRI -EF 60% normal RV. LGE basal septal midwall. Nonspecific scar. Recommending cardiac PET.   - Cardiac Pet after D/C   7. Hypokalemia/hypomagnesemia - Keep K > 4.0 Mg > 2.0  8. AKI due to ATN/shock - baseline SCr  0.9 - Resolved.   9. Hyperglycemia - likely steroid-induced   Length of Stay: 6   Glori Bickers MD 07/24/2021, 10:23 AM  Advanced Heart Failure Team Pager [phone] (M-F; 7a - 4p)  Please contact [facility] for night-coverage after hours (4p -7a )

## 2021-07-24 NOTE — Progress Notes (Signed)
Received 2nd order, pt already on caseload and was evaluated yesterday. Will continue to follow.  Ignacia Palma, OTR/L Acute Rehab Services Pager [phone] Office [phone]

## 2021-07-25 ENCOUNTER — Encounter (HOSPITAL_COMMUNITY)
Admission: EM | Disposition: A | Discharge status: Home Health | Payer: Self-pay | Source: Home / Self Care | Attending: Internal Medicine

## 2021-07-25 ENCOUNTER — Encounter (HOSPITAL_COMMUNITY): Payer: Self-pay | Admitting: Internal Medicine

## 2021-07-25 DIAGNOSIS — I428 Other cardiomyopathies: Secondary | ICD-10-CM

## 2021-07-25 DIAGNOSIS — I472 Ventricular tachycardia, unspecified: Secondary | ICD-10-CM

## 2021-07-25 HISTORY — PX: ICD IMPLANT: EP1208

## 2021-07-25 LAB — CBC
HCT: 23.4 % — ABNORMAL LOW (ref 36.0–46.0)
Hemoglobin: 7.3 g/dL — ABNORMAL LOW (ref 12.0–15.0)
MCH: 29 pg (ref 26.0–34.0)
MCHC: 31.2 g/dL (ref 30.0–36.0)
MCV: 92.9 fL (ref 80.0–100.0)
Platelets: 199 10*3/uL (ref 150–400)
RBC: 2.52 MIL/uL — ABNORMAL LOW (ref 3.87–5.11)
RDW: 16.2 % — ABNORMAL HIGH (ref 11.5–15.5)
WBC: 6.9 10*3/uL (ref 4.0–10.5)
nRBC: 0 % (ref 0.0–0.2)

## 2021-07-25 LAB — SURGICAL PCR SCREEN
MRSA, PCR: NEGATIVE
Staphylococcus aureus: NEGATIVE

## 2021-07-25 LAB — GLUCOSE, CAPILLARY
Glucose-Capillary: 78 mg/dL (ref 70–99)
Glucose-Capillary: 68 mg/dL — ABNORMAL LOW (ref 70–99)
Glucose-Capillary: 83 mg/dL (ref 70–99)
Glucose-Capillary: 72 mg/dL (ref 70–99)
Glucose-Capillary: 115 mg/dL — ABNORMAL HIGH (ref 70–99)

## 2021-07-25 LAB — BASIC METABOLIC PANEL
Anion gap: 5 (ref 5–15)
BUN: 13 mg/dL (ref 8–23)
CO2: 26 mmol/L (ref 22–32)
Calcium: 8.4 mg/dL — ABNORMAL LOW (ref 8.9–10.3)
Chloride: 105 mmol/L (ref 98–111)
Creatinine, Ser: 0.98 mg/dL (ref 0.44–1.00)
GFR, Estimated: 60 mL/min — ABNORMAL LOW (ref >60)
Glucose, Bld: 89 mg/dL (ref 70–99)
Potassium: 3.6 mmol/L (ref 3.5–5.1)
Sodium: 136 mmol/L (ref 135–145)

## 2021-07-25 LAB — COOXEMETRY PANEL
Carboxyhemoglobin: 1.6 % — ABNORMAL HIGH (ref 0.5–1.5)
Methemoglobin: 0.8 % (ref 0.0–1.5)
O2 Saturation: 59.5 %
Total hemoglobin: 9.4 g/dL — ABNORMAL LOW (ref 12.0–16.0)

## 2021-07-25 SURGERY — ICD IMPLANT

## 2021-07-25 MED ORDER — MIDODRINE HCL 5 MG PO TABS
5.0000 mg | ORAL_TABLET | Freq: Three times a day (TID) | ORAL | Status: DC
  Administered 2021-07-25: 5 mg via ORAL
  Filled 2021-07-25: qty 1

## 2021-07-25 MED ORDER — INSULIN ASPART 100 UNIT/ML IJ SOLN
0.0000 [IU] | Freq: Three times a day (TID) | INTRAMUSCULAR | Status: DC
  Administered 2021-07-26 – 2021-07-27 (×2): 2 [IU] via SUBCUTANEOUS
  Administered 2021-07-28: 13:00:00 1 [IU] via SUBCUTANEOUS

## 2021-07-25 MED ORDER — INSULIN ASPART 100 UNIT/ML IJ SOLN: 0.0000 [IU] | Freq: Every day | INTRAMUSCULAR | Status: DC

## 2021-07-25 MED ORDER — FENTANYL CITRATE (PF) 100 MCG/2ML IJ SOLN
INTRAMUSCULAR | Status: DC | PRN
  Administered 2021-07-25: 12.5 ug via INTRAVENOUS

## 2021-07-25 MED ORDER — SODIUM CHLORIDE 0.9 % IV SOLN: Freq: Once | INTRAVENOUS | Status: AC

## 2021-07-25 MED ORDER — MIDAZOLAM HCL 5 MG/5ML IJ SOLN
INTRAMUSCULAR | Status: AC
  Filled 2021-07-25: qty 5

## 2021-07-25 MED ORDER — LIDOCAINE HCL (PF) 1 % IJ SOLN
INTRAMUSCULAR | Status: DC | PRN
  Administered 2021-07-25: 60 mL

## 2021-07-25 MED ORDER — LIDOCAINE HCL (PF) 1 % IJ SOLN
INTRAMUSCULAR | Status: AC
  Filled 2021-07-25: qty 60

## 2021-07-25 MED ORDER — SODIUM CHLORIDE 0.9 % IV SOLN
INTRAVENOUS | Status: AC | PRN
  Administered 2021-07-25: 250 mL via INTRAVENOUS
  Administered 2021-07-25: 150 mL via INTRAVENOUS

## 2021-07-25 MED ORDER — MIDAZOLAM HCL 5 MG/5ML IJ SOLN
INTRAMUSCULAR | Status: DC | PRN
  Administered 2021-07-25 (×2): 1 mg via INTRAVENOUS

## 2021-07-25 MED ORDER — SODIUM CHLORIDE 0.9 % IV SOLN
INTRAVENOUS | Status: AC
  Filled 2021-07-25: qty 2

## 2021-07-25 MED ORDER — FENTANYL CITRATE (PF) 100 MCG/2ML IJ SOLN
INTRAMUSCULAR | Status: AC
  Filled 2021-07-25: qty 2

## 2021-07-25 MED ORDER — POTASSIUM CHLORIDE CRYS ER 20 MEQ PO TBCR
40.0000 meq | EXTENDED_RELEASE_TABLET | Freq: Once | ORAL | Status: AC
  Administered 2021-07-25: 40 meq via ORAL
  Filled 2021-07-25: qty 2

## 2021-07-25 MED ORDER — CEFAZOLIN SODIUM-DEXTROSE 2-4 GM/100ML-% IV SOLN
INTRAVENOUS | Status: AC
  Filled 2021-07-25: qty 100

## 2021-07-25 MED ORDER — CEFAZOLIN SODIUM-DEXTROSE 1-4 GM/50ML-% IV SOLN
1.0000 g | Freq: Four times a day (QID) | INTRAVENOUS | Status: AC
  Administered 2021-07-25 – 2021-07-26 (×3): 1 g via INTRAVENOUS
  Filled 2021-07-25 (×4): qty 50

## 2021-07-25 MED ORDER — DEXTROSE 50 % IV SOLN
INTRAVENOUS | Status: AC
  Administered 2021-07-25: 25 mL
  Filled 2021-07-25: qty 50

## 2021-07-25 MED ORDER — INFLUENZA VAC A&B SA ADJ QUAD 0.5 ML IM PRSY
0.5000 mL | PREFILLED_SYRINGE | Freq: Once | INTRAMUSCULAR | Status: AC
  Administered 2021-07-25: 0.5 mL via INTRAMUSCULAR
  Filled 2021-07-25: qty 0.5

## 2021-07-25 MED ORDER — ACETAMINOPHEN 325 MG PO TABS
325.0000 mg | ORAL_TABLET | ORAL | Status: DC | PRN
  Administered 2021-07-25: 650 mg via ORAL
  Filled 2021-07-25: qty 2

## 2021-07-25 MED ORDER — HEPARIN (PORCINE) IN NACL 1000-0.9 UT/500ML-% IV SOLN
INTRAVENOUS | Status: DC | PRN
  Administered 2021-07-25: 500 mL

## 2021-07-25 SURGICAL SUPPLY — 9 items
CABLE SURGICAL S-101-97-12 (CABLE) ×2 IMPLANT
ICD EVERA XT MRI DF1  DDMB1D1 (ICD Generator) ×1 IMPLANT
ICD EVERA XT MRI DF1 DDMB1D1 (ICD Generator) IMPLANT
LEAD CAPSURE NOVUS 5076-52CM (Lead) ×1 IMPLANT
LEAD SPRINT QUAT SEC 6935-65CM (Lead) ×1 IMPLANT
PAD DEFIB RADIO PHYSIO CONN (PAD) ×2 IMPLANT
SHEATH 7FR PRELUDE SNAP 13 (SHEATH) ×1 IMPLANT
SHEATH 9FR PRELUDE SNAP 13 (SHEATH) ×1 IMPLANT
TRAY PACEMAKER INSERTION (PACKS) ×2 IMPLANT

## 2021-07-25 NOTE — H&P (View-Only) (Signed)
.  Progress Note  Patient Name: Robin Estes Date of Encounter: 07/25/2021  [facility] Cardiologist: Traci Turner, MD   Subjective   Other then hungry, feels well  Inpatient Medications    Scheduled Meds:  amiodarone  200 mg Oral BID   calcium citrate  200 mg of elemental calcium Oral BID WC   chlorhexidine  15 mL Mouth Rinse BID   Chlorhexidine Gluconate Cloth  6 each Topical Daily   cholecalciferol  1,000 Units Oral Daily   docusate  100 mg Oral BID   famotidine  20 mg Oral Daily   feeding supplement  1 Container Oral TID BM   folic acid  1 mg Oral Daily   gentamicin irrigation  80 mg Irrigation On Call   guaiFENesin  600 mg Oral BID   influenza vaccine adjuvanted  0.5 mL Intramuscular Once   insulin aspart  0-5 Units Subcutaneous QHS   insulin aspart  0-9 Units Subcutaneous TID WC   mouth rinse  15 mL Mouth Rinse q12n4p   methotrexate  10 mg Oral Weekly   Followed by   [START ON 08/06/2021] methotrexate  12.5 mg Oral Weekly   Followed by   [START ON 08/20/2021] methotrexate  15 mg Oral Weekly   Followed by   [START ON 09/03/2021] methotrexate  17.5 mg Oral Weekly   Followed by   [START ON 09/17/2021] methotrexate  20 mg Oral Weekly   midodrine  10 mg Oral TID WC   pantoprazole  20 mg Oral Daily   polyethylene glycol  17 g Oral Daily   predniSONE  30 mg Oral Q breakfast   Followed by   [START ON 08/20/2021] predniSONE  25 mg Oral Q breakfast   Followed by   [START ON 09/17/2021] predniSONE  20 mg Oral Q breakfast   Followed by   [START ON 10/15/2021] predniSONE  15 mg Oral Q breakfast   Followed by   [START ON 11/12/2021] predniSONE  10 mg Oral Q breakfast   Followed by   [START ON 11/26/2021] predniSONE  7.5 mg Oral Q breakfast   Followed by   [START ON 12/10/2021] predniSONE  5 mg Oral Q breakfast   Followed by   [START ON 12/24/2021] predniSONE  2.5 mg Oral Q breakfast   sacubitril-valsartan  1 tablet Oral BID   sodium chloride flush  10-40 mL  Intracatheter Q12H   sodium chloride flush  3 mL Intravenous Q12H   sodium chloride flush  3 mL Intravenous Q12H   sodium chloride flush  3 mL Intravenous Q12H   spironolactone  12.5 mg Oral Daily   sulfamethoxazole-trimethoprim  1 tablet Oral Once per day on Mon Wed Fri   Continuous Infusions:  sodium chloride     sodium chloride 50 mL/hr at 07/25/21 0537   sodium chloride Stopped (07/24/21 2315)    ceFAZolin (ANCEF) IV     cefTRIAXone (ROCEPHIN)  IV Stopped (07/24/21 1231)   levETIRAcetam 500 mg (07/25/21 0442)   PRN Meds: sodium chloride, acetaminophen, fentaNYL, midazolam, ondansetron (ZOFRAN) IV, sodium chloride flush, sodium chloride flush, sodium chloride flush   Vital Signs    Vitals:   07/24/21 2340 07/25/21 0306 07/25/21 0552 07/25/21 0709  BP: 136/67 (!) 123/59  (!) 118/56  Pulse: 71 79  78  Resp: 20 19  20  Temp: 97.8 F (36.6 C) 97.8 F (36.6 C)  97.8 F (36.6 C)  TempSrc: Oral Oral  Oral  SpO2: 96% 100%  100%  Weight:     64.6 kg   Height:        Intake/Output Summary (Last 24 hours) at 07/25/2021 1137 Last data filed at 07/25/2021 0900 Gross per 24 hour  Intake 760.52 ml  Output 500 ml  Net 260.52 ml   Last 3 Weights 07/25/2021 07/24/2021 07/23/2021  Weight (lbs) 142 lb 6.7 oz 141 lb 12.1 oz 143 lb 4.8 oz  Weight (kg) 64.6 kg 64.3 kg 65 kg      Telemetry    SR - Personally Reviewed  ECG    No new EKGs - Personally Reviewed  Physical Exam   No changes in exam GEN: No acute distress, appears somewhat chronically ill, thin body habitus Neck: No JVD  Cardiac: RRR, 3/6 SM, no rubs, or gallops.  Respiratory: CTA b/l. GI: Soft, nontender, non-distended  MS: No edema; No deformity. Neuro:  Nonfocal  Psych: Normal affect   Labs    High Sensitivity Troponin:   Recent Labs  Lab 07/17/21 2216 07/18/21 0015 07/18/21 0229 07/18/21 0429  TROPONINIHS 16 18* 18* 21*     Chemistry Recent Labs  Lab 07/21/21 0456 07/22/21 0256 07/22/21 1645  07/23/21 0520 07/24/21 0500 07/25/21 0455  NA 128* 133*   < > 138 136 136  K 3.7 4.8   < > 3.9 4.1 3.6  CL 97* 103  --  104 103 105  CO2 24 26  --  29 27 26   GLUCOSE 287* 143*  --  89 106* 89  BUN 19 14  --  11 13 13   CREATININE 1.53* 1.11*  --  0.98 1.01* 0.98  CALCIUM 7.6* 7.9*  --  8.0* 8.2* 8.4*  MG 1.9 2.2  --   --  1.9  --   GFRNONAA 35* 52*  --  60* 58* 60*  ANIONGAP 7 4*  --  5 6 5    < > = values in this interval not displayed.    Lipids No results for input(s): CHOL, TRIG, HDL, LABVLDL, LDLCALC, CHOLHDL in the last 168 hours.  Hematology Recent Labs  Lab 07/21/21 0856 07/22/21 1645 07/22/21 1704 07/23/21 0520 07/25/21 0455  WBC 13.2*  --   --  5.7 6.9  RBC 2.98*  --   --  2.81* 2.52*  HGB 8.7*   < > 7.1* 8.4* 7.3*  HCT 27.3*   < > 21.0* 25.5* 23.4*  MCV 91.6  --   --  90.7 92.9  MCH 29.2  --   --  29.9 29.0  MCHC 31.9  --   --  32.9 31.2  RDW 15.3  --   --  16.1* 16.2*  PLT PLATELET CLUMPS NOTED ON SMEAR, UNABLE TO ESTIMATE  --   --  200 199   < > = values in this interval not displayed.   Thyroid No results for input(s): TSH, FREET4 in the last 168 hours.  BNP No results for input(s): BNP, PROBNP in the last 168 hours.   DDimer  No results for input(s): DDIMER in the last 168 hours.    Radiology    No results found.  Cardiac Studies   ECHO 03/2019 EF 30-35% . Myoview - no ischemia. 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 RV normal. Minimal LGE -> doubt significant cardiac sarcoid - Echo 12/20 EF ~45% MV appears thickened and possible rheumatic with 3+ MR. Severe LAE. Mod TR.  - TEE 8/21: LVEF 40-45% w/ mild global HK moderate to severe functional central MR. - Echo  07/18/20 EF 35-40% Severe MR -Developed cardiogenic shock on 11/19 with co-ox 42% -CMRI LV EF 60% RV 60%. LVEF 60% RV 60% . LGE basal septal midwall. Nonspecific scar. Recommending cardiac PET.    cath Normal coronary arteries 2. EF 45% by v-gram  Patient Profile      75 y.o. female w/x of sarcoidosis (eye,lung, skin, biopsy proven), MAT, HTN, pulm nodules, chronic CHF (systolic), NICM  cMRI 04/2019 showed "Possible subtle mid-wall LGE in the mid inferior wall (not seen on long axis views). This could be consistent with cardiac sarcoidosis.  Admitted ot [facility] 07/17/21 with syncope, in the aiing room arrested, Vfib (CPR time approx 5 minutes; 1 dose of epi; she was successfully shocked out of it). Post ROSC she had MAT/Afib  K+ was 2.8, Mag 16   07/18/20 TTE EF 35-40% Severe MR 11/19 Head CT negative for acute process.  11/20 Extubated to 3 liters [place].  11/21 Dobutamine cut back to 5 mg tid and Norep weaning. Started on midodrine.  11/22 Norepi stopped and dobutamine cut back to 2.5 mcg.Give 40 mg IV lasix prior to cath. See below.  11/23 Dobutamine stopped and amiodarone gtt > PO 200mg BID to place on po steroids with taper to MTX. Start PPI. PJP prophylaxis   CMRI- LVEF 60% RV 60% . LGE basal septal midwall. Nonspecific scar. Recommending cardiac PET.     Cath no coronary disease, high output on norepi +dobutamine, and low SVR. EF 45%.   On Rocephin,  for abn UA  Assessment & Plan    PMVT/VF arrest  AHF team attending No VT on tele  Pre-hospitalization lived independently without limitations  Planned for ICD implant today The patient family at bedside, no follow up questions, everyone remains agreeable to proceed   For questions or updates, please contact [facility] Please consult [email or web address] for contact info under     Signed, Renee Lynn Ursuy, PA-C  07/25/2021, 11:37 AM    EP Attending  Patient seen and examined. Agree with the findings as noted above. The patient is doing well. She will undergo ICD implantation later today. I have reviewed the indications/risks/benefits/goals/expectations and she wishes to proceed.  Zoiey Christy,MD   

## 2021-07-25 NOTE — Plan of Care (Signed)

## 2021-07-25 NOTE — Progress Notes (Signed)
Hypoglycemic event----Cbg 68, gave 44ml D50, to be rechecked at 1320

## 2021-07-25 NOTE — Progress Notes (Signed)
Physical Therapy Treatment Patient Details Name: Robin Estes MRN: [id number] DOB: August 06, 1946 Today's Date: 07/25/2021   History of Present Illness Pt adm 11/18 with cardiac arrest with V-fib in ED. Pt intubated on 11/18. Extubated 11/20.  PMH - sarcoidosis, HTN, NICM, chf, legally blind rt eye    PT Comments    Pt continues to make good progress with mobility and activity tolerance. Continue to maximize independence prior to return home. Will reassess after ICD.    Recommendations for follow up therapy are one component of a multi-disciplinary discharge planning process, led by the attending physician.  Recommendations may be updated based on patient status, additional functional criteria and insurance authorization.  Follow Up Recommendations  Home health PT     Assistance Recommended at Discharge    Equipment Recommendations  Rollator (4 wheels) (daughter says she can get rollator)    Recommendations for Other Services       Precautions / Restrictions Precautions Precautions: Fall     Mobility  Bed Mobility               General bed mobility comments: Pt up in chair    Transfers Overall transfer level: Needs assistance Equipment used: Rollator (4 wheels);None Transfers: Sit to/from Stand Sit to Stand: Supervision;Min guard           General transfer comment: Supervision for safety. Amb in hall with rollator and supervision. Amb in room without assistive device and min guard    Ambulation/Gait Ambulation/Gait assistance: Supervision Gait Distance (Feet): 300 Feet Assistive device: Rollator (4 wheels) Gait Pattern/deviations: Step-through pattern;Decreased stride length Gait velocity: decr Gait velocity interpretation: 1.31 - 2.62 ft/sec, indicative of limited community ambulator   General Gait Details: assist for safety.   Stairs             Wheelchair Mobility    Modified Rankin (Stroke Patients Only)       Balance Overall balance  assessment: Needs assistance Sitting-balance support: Feet supported;No upper extremity supported Sitting balance-Leahy Scale: Normal     Standing balance support: No upper extremity supported;During functional activity Standing balance-Leahy Scale: Fair                              Cognition Arousal/Alertness: Awake/alert Behavior During Therapy: WFL for tasks assessed/performed Overall Cognitive Status: Within Functional Limits for tasks assessed                                          Exercises      General Comments General comments (skin integrity, edema, etc.): VSS on RA      Pertinent Vitals/Pain Pain Assessment: No/denies pain    Home Living                          Prior Function            PT Goals (current goals can now be found in the care plan section) Acute Rehab PT Goals Patient Stated Goal: back to dancing Progress towards PT goals: Progressing toward goals    Frequency    Min 3X/week      PT Plan Current plan remains appropriate    Co-evaluation              AM-PAC PT "6 Clicks" Mobility   Outcome Measure  Help needed turning from your back to your side while in a flat bed without using bedrails?: None Help needed moving from lying on your back to sitting on the side of a flat bed without using bedrails?: A Little Help needed moving to and from a bed to a chair (including a wheelchair)?: A Little Help needed standing up from a chair using your arms (e.g., wheelchair or bedside chair)?: A Little Help needed to walk in hospital room?: A Little Help needed climbing 3-5 steps with a railing? : A Little 6 Click Score: 19    End of Session Equipment Utilized During Treatment: Gait belt Activity Tolerance: Patient tolerated treatment well Patient left: in chair;with call bell/phone within reach;with family/visitor present Nurse Communication: Mobility status PT Visit Diagnosis: Other abnormalities  of gait and mobility (R26.89);Muscle weakness (generalized) (M62.81)     Time: 5852-7782 PT Time Calculation (min) (ACUTE ONLY): 20 min  Charges:  $Gait Training: 8-22 mins                     [facility] PT Acute Rehabilitation Services Pager [phone] Office [phone]    Angelina Ok [facility] 07/25/2021, 10:05 AM

## 2021-07-25 NOTE — Discharge Instructions (Signed)
    Supplemental Discharge Instructions for  Pacemaker/Defibrillator Patients   Activity No heavy lifting or vigorous activity with your left/right arm for 6 to 8 weeks.  Do not raise your left/right arm above your head for one week.  Gradually raise your affected arm as drawn below.             07/29/21                   07/30/21                  07/31/21                    08/01/21 __  NO DRIVING 6 months.  WOUND CARE Keep the wound area clean and dry.  Do not get this area wet , no showers for one week; you may shower on  07/02/21   . The tape/steri-strips on your wound will fall off; do not pull them off.  No bandage is needed on the site.  DO  NOT apply any creams, oils, or ointments to the wound area. If you notice any drainage or discharge from the wound, any swelling or bruising at the site, or you develop a fever > 101? F after you are discharged home, call the office at once.  Special Instructions You are still able to use cellular telephones; use the ear opposite the side where you have your pacemaker/defibrillator.  Avoid carrying your cellular phone near your device. When traveling through airports, show security personnel your identification card to avoid being screened in the metal detectors.  Ask the security personnel to use the hand wand. Avoid arc welding equipment, MRI testing (magnetic resonance imaging), TENS units (transcutaneous nerve stimulators).  Call the office for questions about other devices. Avoid electrical appliances that are in poor condition or are not properly grounded. Microwave ovens are safe to be near or to operate.  Additional information for defibrillator patients should your device go off: If your device goes off ONCE and you feel fine afterward, notify the device clinic nurses. If your device goes off ONCE and you do not feel well afterward, call 911. If your device goes off TWICE, call 911. If your device goes off THREE times in one day, call  911.  DO NOT DRIVE YOURSELF OR A FAMILY MEMBER WITH A DEFIBRILLATOR TO THE HOSPITAL--CALL 911.

## 2021-07-25 NOTE — Progress Notes (Addendum)
Advanced Heart Failure Rounding Note   Subjective:    Echo 07/18/20 EF 35-40% Severe MR 11/19 Head CT negative for acute process.  11/20 Extubated to 3 liters [place].  11/21 Dobutamine cut back to 5 mg tid and Norep weaning. Started on midodrine.  11/22 Norepi stopped and dobutamine cut back to 2.5 mcg.Give 40 mg IV lasix prior to cath. See below.   CMRI- LVEF 60% RV 60% . LGE basal septal midwall. Nonspecific scar. Recommending cardiac PET.    Cath no coronary disease, high output on norepi +dobutamine, and low SVR. EF 45%.   RHC/LHC with no coronary disease. Low filling pressures. High output on DBA + Noepi.   Off pressors.CO-OX 60%  Low glucose this morning.   Feels ok. Denies SOB.  Denies dizziness.     Objective:   Weight Range:  Vital Signs:   Temp:  [97.6 F (36.4 C)-98.1 F (36.7 C)] 97.8 F (36.6 C) (11/25 0709) Pulse Rate:  [67-79] 78 (11/25 0709) Resp:  [18-21] 20 (11/25 0709) BP: (115-136)/(56-67) 118/56 (11/25 0709) SpO2:  [96 %-100 %] 100 % (11/25 0709) Weight:  [64.6 kg] 64.6 kg (11/25 0552) Last BM Date: 07/25/21  Weight change: Filed Weights   07/23/21 0500 07/24/21 0431 07/25/21 0552  Weight: 65 kg 64.3 kg 64.6 kg    Intake/Output:   Intake/Output Summary (Last 24 hours) at 07/25/2021 0820 Last data filed at 07/25/2021 0600 Gross per 24 hour  Intake 700.52 ml  Output 500 ml  Net 200.52 ml    CVP 4-5  Physical Exam: General:  Sitting in the chair.  No resp difficulty HEENT: normal Neck: supple. no JVD. Carotids 2+ bilat; no bruits. No lymphadenopathy or thryomegaly appreciated. Cor: PMI nondisplaced. Regular rate & rhythm. No rubs, gallops or murmurs. Lungs: clear on room air.  Abdomen: soft, nontender, nondistended. No hepatosplenomegaly. No bruits or masses. Good bowel sounds. Extremities: no cyanosis, clubbing, rash, R and LLE trace edema. RUE PICC Neuro: alert & orientedx3, cranial nerves grossly intact. moves all 4 extremities w/o  difficulty. Affect pleasant   Telemetry: SR 70s personally reviewed.    Labs: Basic Metabolic Panel: Recent Labs  Lab 07/18/21 1343 07/18/21 2204 07/19/21 0541 07/19/21 1039 07/19/21 1911 07/20/21 0315 07/21/21 0456 07/22/21 0256 07/22/21 1645 07/22/21 1659 07/22/21 1704 07/23/21 0520 07/24/21 0500 07/25/21 0455  NA 134*  --  126* 124* 124*   < > 128* 133*   < > 143 143 138 136 136  K 3.3*  --  5.1 4.2 4.1   < > 3.7 4.8   < > 3.3* 3.3* 3.9 4.1 3.6  CL 102  --  96* 94* 95*   < > 97* 103  --   --   --  104 103 105  CO2 25  --  19* 19* 19*   < > 24 26  --   --   --  29 27 26   GLUCOSE 288*  --  445* 476* 439*   < > 287* 143*  --   --   --  89 106* 89  BUN 7*  --  12 12 15    < > 19 14  --   --   --  11 13 13   CREATININE 0.90  --  1.64* 1.89* 2.12*   < > 1.53* 1.11*  --   --   --  0.98 1.01* 0.98  CALCIUM 8.0*  --  7.5* 7.6* 7.7*   < > 7.6* 7.9*  --   --   --  8.0* 8.2* 8.4*  MG 2.6* 2.6* 2.4 2.4 2.2  --  1.9 2.2  --   --   --   --  1.9  --   PHOS 3.2 3.5 4.6  --  3.9  --   --   --   --   --   --   --   --   --    < > = values in this interval not displayed.    Liver Function Tests: No results for input(s): AST, ALT, ALKPHOS, BILITOT, PROT, ALBUMIN in the last 168 hours. No results for input(s): LIPASE, AMYLASE in the last 168 hours. No results for input(s): AMMONIA in the last 168 hours.  CBC: Recent Labs  Lab 07/19/21 0541 07/20/21 0636 07/21/21 0856 07/22/21 1645 07/22/21 1652 07/22/21 1659 07/22/21 1704 07/23/21 0520 07/25/21 0455  WBC 9.7  --  13.2*  --   --   --   --  5.7 6.9  NEUTROABS 8.7*  --   --   --   --   --   --   --   --   HGB 9.7*   < > 8.7*   < > 8.8* 7.5* 7.1* 8.4* 7.3*  HCT 31.4*   < > 27.3*   < > 26.0* 22.0* 21.0* 25.5* 23.4*  MCV 94.9  --  91.6  --   --   --   --  90.7 92.9  PLT 367  --  PLATELET CLUMPS NOTED ON SMEAR, UNABLE TO ESTIMATE  --   --   --   --  200 199   < > = values in this interval not displayed.    Cardiac Enzymes: No  results for input(s): CKTOTAL, CKMB, CKMBINDEX, TROPONINI in the last 168 hours.  BNP: BNP (last 3 results) Recent Labs    04/18/21 1340 07/18/21 0230  BNP 124.9* 893.2*    ProBNP (last 3 results) No results for input(s): PROBNP in the last 8760 hours.    Other results:  Imaging: No results found.   Medications:     Scheduled Medications:  amiodarone  200 mg Oral BID   calcium citrate  200 mg of elemental calcium Oral BID WC   chlorhexidine  15 mL Mouth Rinse BID   Chlorhexidine Gluconate Cloth  6 each Topical Daily   cholecalciferol  1,000 Units Oral Daily   docusate  100 mg Oral BID   famotidine  20 mg Oral Daily   feeding supplement  1 Container Oral TID BM   folic acid  1 mg Oral Daily   gentamicin irrigation  80 mg Irrigation On Call   guaiFENesin  600 mg Oral BID   insulin aspart  0-15 Units Subcutaneous TID WC   mouth rinse  15 mL Mouth Rinse q12n4p   methotrexate  10 mg Oral Weekly   Followed by   Melene Muller ON 08/06/2021] methotrexate  12.5 mg Oral Weekly   Followed by   Melene Muller ON 08/20/2021] methotrexate  15 mg Oral Weekly   Followed by   Melene Muller ON 09/03/2021] methotrexate  17.5 mg Oral Weekly   Followed by   Melene Muller ON 09/17/2021] methotrexate  20 mg Oral Weekly   midodrine  10 mg Oral TID WC   pantoprazole  20 mg Oral Daily   polyethylene glycol  17 g Oral Daily   predniSONE  30 mg Oral Q breakfast   Followed by   Melene Muller ON 08/20/2021] predniSONE  25 mg Oral Q breakfast  Followed by   Melene Muller ON 09/17/2021] predniSONE  20 mg Oral Q breakfast   Followed by   Melene Muller ON 10/15/2021] predniSONE  15 mg Oral Q breakfast   Followed by   Melene Muller ON 11/12/2021] predniSONE  10 mg Oral Q breakfast   Followed by   Melene Muller ON 11/26/2021] predniSONE  7.5 mg Oral Q breakfast   Followed by   Melene Muller ON 12/10/2021] predniSONE  5 mg Oral Q breakfast   Followed by   Melene Muller ON 12/24/2021] predniSONE  2.5 mg Oral Q breakfast   sacubitril-valsartan  1 tablet Oral BID   sodium  chloride flush  10-40 mL Intracatheter Q12H   sodium chloride flush  3 mL Intravenous Q12H   sodium chloride flush  3 mL Intravenous Q12H   sodium chloride flush  3 mL Intravenous Q12H   spironolactone  12.5 mg Oral Daily   sulfamethoxazole-trimethoprim  1 tablet Oral Once per day on Mon Wed Fri    Infusions:  sodium chloride     sodium chloride 50 mL/hr at 07/25/21 0537   sodium chloride Stopped (07/24/21 2315)    ceFAZolin (ANCEF) IV     cefTRIAXone (ROCEPHIN)  IV Stopped (07/24/21 1231)   levETIRAcetam 500 mg (07/25/21 0442)    PRN Medications: sodium chloride, acetaminophen, fentaNYL, midazolam, ondansetron (ZOFRAN) IV, sodium chloride flush, sodium chloride flush, sodium chloride flush   Assessment/Plan:   1. 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 Lido now off - Continue amio 200 mg twice a day.  - Keep K > 4.0 and Mg > 2.0 - Cath without coronary disease. cMRI wit LGE possible sarcoid. Need cardiac PET as an outpatient.  - Plan ICD today  - Continue steroid protocol.    2. 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 RV normal. Minimal LGE -> doubt significant cardiac sarcoid - Echo 12/20 EF ~45% MV appears thickened and possible rheumatic with 3+ MR. Severe LAE. Mod TR.  - TEE 8/21: LVEF 40-45% w/ mild global HK moderate to severe functional central MR. - Echo 07/18/20 EF 35-40% Severe MR -Developed cardiogenic shock on 11/19 with co-ox 42% -CMRI LV EF 60% RV 60%. LVEF 60% RV 60% . LGE basal septal midwall. Nonspecific scar. Recommending cardiac PET.   - Off inotropes. Co-ox 60%  volume status ok  - Continue  spiro 12.5 and  Entresto at low dose - Remains on midodrine 10 mg tid. Consider cutting back tomorrow.  - Has been unable to tolerate Farxiga   3. Acute hypoxic respiratory failure in setting of  #1 - Extubated 07/20/21 to 3 liters. - Stable on RA  4. MAT - Zio in 9/20       1. Sinus rhythm - avg HR of 82      2. 22 Supraventricular Tachycardia runs occurred, the run with the fastest interval lasting 5 beats with a max rate of 184 bpm, the longest lasting 10.9 secs with an avg rate of 162 bpm.     3. Isolated PACs were occasional (4.7%, 18936)     4. Rare PVCs (<1.0%) - currently in NSR  5. 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 - She had been referred for possible [employer]. Did not keep appointment last fall due to gap in insurance coverage - Severe MR  on echo but moderate on cMRI    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 Everardo All in Pulmonary. - Plan as above. Steroids started - MRI -EF 60% normal RV. LGE basal septal midwall. Nonspecific scar. Recommending cardiac PET.   - Cardiac Pet after D/C [facility] Immunosuppressive Therapy for Cardiac Sarcoid  -Start date 11/23 Prednisone 30 mg daily  -Start date 11/23 Methotrexate 10 mg (four 2.5 mg tablets) once weekly Additional Medications  Bactrim DS : 1 tablet Mon-Wed- Fri while taking >15 mg of prednisone daily  Folic Acid : 1 mg daily  Calcium: 1 tab  BID with meals  Vitamin D: 1 tab daily  Protonix 20 mg daily Immunizations- Pneumonia 2016    -Flu ordered        7. Hypokalemia/hypomagnesemia - Keep K > 4.0 Mg > 2.0 - Supp K   8. AKI due to ATN/shock - baseline SCr  0.9 - Resolved.   9. Hyperglycemia - likely steroid-induced - Adjust SSI. Had hypoglycemia this morning but she is NPO of procedure.   Continue to mobilize.    Length of Stay: 7   Amy Clegg NP_C  07/25/2021, 8:20 AM  Advanced Heart Failure Team Pager [phone] (M-F; 7a - 4p)  Please contact [facility] for night-coverage after hours (4p -7a )    Patient seen and examined with the above-signed Advanced Practice Provider and/or Housestaff. I personally reviewed  laboratory data, imaging studies and relevant notes. I independently examined the patient and formulated the important aspects of the plan. I have edited the note to reflect any of my changes or salient points. I have personally discussed the plan with the patient and/or family.  Denies CP or SOB. Glucose was low this am.   Rhythm stable. Volume status and co-ox ok   General:  Sitting in chair No resp difficulty HEENT: normal + sarcoid skin changes Neck: supple. no JVD. Carotids 2+ bilat; no bruits. No lymphadenopathy or thryomegaly appreciated. Cor: PMI nondisplaced. Regular rate & rhythm. No rubs, gallops or murmurs. Lungs: clear Abdomen: soft, nontender, nondistended. No hepatosplenomegaly. No bruits or masses. Good bowel sounds. Extremities: no cyanosis, clubbing, rash, edema Neuro: alert & orientedx3, cranial nerves grossly intact. moves all 4 extremities w/o difficulty. Affect pleasant  Rhythm stable. No further VT. Can drop midodrine to 5 tid. ICD today. Ambulate.   Arvilla Meres, MD  10:17 AM

## 2021-07-25 NOTE — Progress Notes (Addendum)
.  Progress Note  Patient Name: Robin Estes Date of Encounter: 07/25/2021  [facility] Cardiologist: Fransico Him, MD   Subjective   Other then hungry, feels well  Inpatient Medications    Scheduled Meds:  amiodarone  200 mg Oral BID   calcium citrate  200 mg of elemental calcium Oral BID WC   chlorhexidine  15 mL Mouth Rinse BID   Chlorhexidine Gluconate Cloth  6 each Topical Daily   cholecalciferol  1,000 Units Oral Daily   docusate  100 mg Oral BID   famotidine  20 mg Oral Daily   feeding supplement  1 Container Oral TID BM   folic acid  1 mg Oral Daily   gentamicin irrigation  80 mg Irrigation On Call   guaiFENesin  600 mg Oral BID   influenza vaccine adjuvanted  0.5 mL Intramuscular Once   insulin aspart  0-5 Units Subcutaneous QHS   insulin aspart  0-9 Units Subcutaneous TID WC   mouth rinse  15 mL Mouth Rinse q12n4p   methotrexate  10 mg Oral Weekly   Followed by   Derrill Memo ON 08/06/2021] methotrexate  12.5 mg Oral Weekly   Followed by   Derrill Memo ON 08/20/2021] methotrexate  15 mg Oral Weekly   Followed by   Derrill Memo ON 09/03/2021] methotrexate  17.5 mg Oral Weekly   Followed by   Derrill Memo ON 09/17/2021] methotrexate  20 mg Oral Weekly   midodrine  10 mg Oral TID WC   pantoprazole  20 mg Oral Daily   polyethylene glycol  17 g Oral Daily   predniSONE  30 mg Oral Q breakfast   Followed by   Derrill Memo ON 08/20/2021] predniSONE  25 mg Oral Q breakfast   Followed by   Derrill Memo ON 09/17/2021] predniSONE  20 mg Oral Q breakfast   Followed by   Derrill Memo ON 10/15/2021] predniSONE  15 mg Oral Q breakfast   Followed by   Derrill Memo ON 11/12/2021] predniSONE  10 mg Oral Q breakfast   Followed by   Derrill Memo ON 11/26/2021] predniSONE  7.5 mg Oral Q breakfast   Followed by   Derrill Memo ON 12/10/2021] predniSONE  5 mg Oral Q breakfast   Followed by   Derrill Memo ON 12/24/2021] predniSONE  2.5 mg Oral Q breakfast   sacubitril-valsartan  1 tablet Oral BID   sodium chloride flush  10-40 mL  Intracatheter Q12H   sodium chloride flush  3 mL Intravenous Q12H   sodium chloride flush  3 mL Intravenous Q12H   sodium chloride flush  3 mL Intravenous Q12H   spironolactone  12.5 mg Oral Daily   sulfamethoxazole-trimethoprim  1 tablet Oral Once per day on Mon Wed Fri   Continuous Infusions:  sodium chloride     sodium chloride 50 mL/hr at 07/25/21 0537   sodium chloride Stopped (07/24/21 2315)    ceFAZolin (ANCEF) IV     cefTRIAXone (ROCEPHIN)  IV Stopped (07/24/21 1231)   levETIRAcetam 500 mg (07/25/21 0442)   PRN Meds: sodium chloride, acetaminophen, fentaNYL, midazolam, ondansetron (ZOFRAN) IV, sodium chloride flush, sodium chloride flush, sodium chloride flush   Vital Signs    Vitals:   07/24/21 2340 07/25/21 0306 07/25/21 0552 07/25/21 0709  BP: 136/67 (!) 123/59  (!) 118/56  Pulse: 71 79  78  Resp: 20 19  20   Temp: 97.8 F (36.6 C) 97.8 F (36.6 C)  97.8 F (36.6 C)  TempSrc: Oral Oral  Oral  SpO2: 96% 100%  100%  Weight:  64.6 kg   Height:        Intake/Output Summary (Last 24 hours) at 07/25/2021 1137 Last data filed at 07/25/2021 0900 Gross per 24 hour  Intake 760.52 ml  Output 500 ml  Net 260.52 ml   Last 3 Weights 07/25/2021 07/24/2021 07/23/2021  Weight (lbs) 142 lb 6.7 oz 141 lb 12.1 oz 143 lb 4.8 oz  Weight (kg) 64.6 kg 64.3 kg 65 kg      Telemetry    SR - Personally Reviewed  ECG    No new EKGs - Personally Reviewed  Physical Exam   No changes in exam GEN: No acute distress, appears somewhat chronically ill, thin body habitus Neck: No JVD  Cardiac: RRR, 3/6 SM, no rubs, or gallops.  Respiratory: CTA b/l. GI: Soft, nontender, non-distended  MS: No edema; No deformity. Neuro:  Nonfocal  Psych: Normal affect   Labs    High Sensitivity Troponin:   Recent Labs  Lab 07/17/21 2216 07/18/21 0015 07/18/21 0229 07/18/21 0429  TROPONINIHS 16 18* 18* 21*     Chemistry Recent Labs  Lab 07/21/21 0456 07/22/21 0256 07/22/21 1645  07/23/21 0520 07/24/21 0500 07/25/21 0455  NA 128* 133*   < > 138 136 136  K 3.7 4.8   < > 3.9 4.1 3.6  CL 97* 103  --  104 103 105  CO2 24 26  --  29 27 26   GLUCOSE 287* 143*  --  89 106* 89  BUN 19 14  --  11 13 13   CREATININE 1.53* 1.11*  --  0.98 1.01* 0.98  CALCIUM 7.6* 7.9*  --  8.0* 8.2* 8.4*  MG 1.9 2.2  --   --  1.9  --   GFRNONAA 35* 52*  --  60* 58* 60*  ANIONGAP 7 4*  --  5 6 5    < > = values in this interval not displayed.    Lipids No results for input(s): CHOL, TRIG, HDL, LABVLDL, LDLCALC, CHOLHDL in the last 168 hours.  Hematology Recent Labs  Lab 07/21/21 0856 07/22/21 1645 07/22/21 1704 07/23/21 0520 07/25/21 0455  WBC 13.2*  --   --  5.7 6.9  RBC 2.98*  --   --  2.81* 2.52*  HGB 8.7*   < > 7.1* 8.4* 7.3*  HCT 27.3*   < > 21.0* 25.5* 23.4*  MCV 91.6  --   --  90.7 92.9  MCH 29.2  --   --  29.9 29.0  MCHC 31.9  --   --  32.9 31.2  RDW 15.3  --   --  16.1* 16.2*  PLT PLATELET CLUMPS NOTED ON SMEAR, UNABLE TO ESTIMATE  --   --  200 199   < > = values in this interval not displayed.   Thyroid No results for input(s): TSH, FREET4 in the last 168 hours.  BNP No results for input(s): BNP, PROBNP in the last 168 hours.   DDimer  No results for input(s): DDIMER in the last 168 hours.    Radiology    No results found.  Cardiac Studies   ECHO 03/2019 EF 30-35% . Myoview - no ischemia. 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 RV normal. Minimal LGE -> doubt significant cardiac sarcoid - Echo 12/20 EF ~45% MV appears thickened and possible rheumatic with 3+ MR. Severe LAE. Mod TR.  - TEE 8/21: LVEF 40-45% w/ mild global HK moderate to severe functional central MR. - Echo  07/18/20 EF 35-40% Severe MR -Developed cardiogenic shock on 11/19 with co-ox 42% -CMRI LV EF 60% RV 60%. LVEF 60% RV 60% . LGE basal septal midwall. Nonspecific scar. Recommending cardiac PET.    cath Normal coronary arteries 2. EF 45% by v-gram  Patient Profile      75 y.o. female w/x of sarcoidosis (eye,lung, skin, biopsy proven), MAT, HTN, pulm nodules, chronic CHF (systolic), NICM  cMRI [id number] showed "Possible subtle mid-wall LGE in the mid inferior wall (not seen on long axis views). This could be consistent with cardiac sarcoidosis.  Admitted ot [facility] 07/17/21 with syncope, in the aiing room arrested, Vfib (CPR time approx 5 minutes; 1 dose of epi; she was successfully shocked out of it). Post ROSC she had MAT/Afib  K+ was 2.8, Mag 16   07/18/20 TTE EF 35-40% Severe MR 11/19 Head CT negative for acute process.  11/20 Extubated to 3 liters [place].  11/21 Dobutamine cut back to 5 mg tid and Norep weaning. Started on midodrine.  11/22 Norepi stopped and dobutamine cut back to 2.5 mcg.Give 40 mg IV lasix prior to cath. See below.  11/23 Dobutamine stopped and amiodarone gtt > PO 200mg  BID to place on po steroids with taper to MTX. Start PPI. PJP prophylaxis   CMRI- LVEF 60% RV 60% . LGE basal septal midwall. Nonspecific scar. Recommending cardiac PET.     Cath no coronary disease, high output on norepi +dobutamine, and low SVR. EF 45%.   On Rocephin,  for abn UA  Assessment & Plan    PMVT/VF arrest  AHF team attending No VT on tele  Pre-hospitalization lived independently without limitations  Planned for ICD implant today The patient family at bedside, no follow up questions, everyone remains agreeable to proceed   For questions or updates, please contact [facility] Please consult [email or web address] for contact info under     Signed, Baldwin Jamaica, PA-C  07/25/2021, 11:37 AM    EP Attending  Patient seen and examined. Agree with the findings as noted above. The patient is doing well. She will undergo ICD implantation later today. I have reviewed the indications/risks/benefits/goals/expectations and she wishes to proceed.  Carleene Overlie Daymian Lill,MD

## 2021-07-25 NOTE — Interval H&P Note (Signed)
History and Physical Interval Note:  07/25/2021 1:28 PM  Robin Estes  has presented today for surgery, with the diagnosis of cardiac arrest.  The various methods of treatment have been discussed with the patient and family. After consideration of risks, benefits and other options for treatment, the patient has consented to  Procedure(s): ICD IMPLANT (N/A) as a surgical intervention.  The patient's history has been reviewed, patient examined, no change in status, stable for surgery.  I have reviewed the patient's chart and labs.  Questions were answered to the patient's satisfaction.     Lewayne Bunting

## 2021-07-25 NOTE — TOC Initial Note (Addendum)
Transition of Care [facility]) - Initial/Assessment Note    Patient Details  Name: Robin Estes MRN: [id number] Date of Birth: May 25, 1946  Transition of Care [facility]) CM/SW Contact:    Elliot Cousin, RN Phone Number: [phone] 07/25/2021, 11:36 AM  Clinical Narrative:                 HF TOC CM spoke to pt and dtr, [name] at bedside. Dtr states pt missed a payment and currently only has Med A. States she plan to enroll her in a Medicare Advantage Plan now during enrollment period. Pt will need Rolling walker with seat for home. Will need HH RN and PT orders with [facility]. Pt will need charity until her benefits start in Jan 2023.   Contacted [facility] with new referral for [facility] Contacted [employer] for rolling walker with seat to be delivered to room.   Expected Discharge Plan: Home w Home Health Services Barriers to Discharge: Continued Medical Work up   Patient Goals and CMS Choice Patient states their goals for this hospitalization and ongoing recovery are:: wants to get stronger and feel better CMS Medicare.gov Compare Post Acute Care list provided to:: Patient Choice offered to / list presented to : Patient  Expected Discharge Plan and Services Expected Discharge Plan: Home w Home Health Services In-house Referral: Clinical Social Work Discharge Planning Services: CM Consult Post Acute Care Choice: Home Health Living arrangements for the past 2 months: Single Family Home   Prior Living Arrangements/Services Living arrangements for the past 2 months: Single Family Home Lives with:: Spouse Patient language and need for interpreter reviewed:: Yes Do you feel safe going back to the place where you live?: Yes      Need for Family Participation in Patient Care: Yes (Comment) Care giver support system in place?: Yes (comment)   Criminal Activity/Legal Involvement Pertinent to Current Situation/Hospitalization: No - Comment as needed  Activities of Daily Living      Permission  Sought/Granted Permission sought to share information with : Case Manager, Family Supports, PCP Permission granted to share information with : Yes, Verbal Permission Granted  Share Information with NAME: Lora Paula  Permission granted to share info w AGENCY: Home Health, DME  Permission granted to share info w Relationship: daughter  Permission granted to share info w Contact Information: [phone]  Emotional Assessment Appearance:: Appears stated age Attitude/Demeanor/Rapport: Gracious Affect (typically observed): Accepting Orientation: : Oriented to Self, Oriented to Place, Oriented to  Time, Oriented to Situation   Psych Involvement: No (comment)  Admission diagnosis:  Ventricular fibrillation (HCC) [I49.01] Cardiac arrest (HCC) [I46.9] Hypokalemia [E87.6] Respiration abnormal [R06.9] Cardiac arrest with ventricular fibrillation (HCC) [I46.9, I49.01] Patient Active Problem List   Diagnosis Date Noted   Malnutrition of moderate degree 07/19/2021   Cardiac arrest with ventricular fibrillation (HCC) 07/18/2021   Ventricular fibrillation (HCC) 07/18/2021   Endotracheally intubated 07/18/2021   Hypokalemia 07/18/2021   Hypomagnesemia 07/18/2021   Hypocalcemia 07/18/2021   Mitral regurgitation 04/18/2021   Hypertension 04/18/2021   Sarcoidosis of skin 07/09/2019   Solitary pulmonary nodule 07/09/2019   Pulmonary sarcoidosis (HCC) 07/09/2019   HFrEF (heart failure with reduced ejection fraction) (HCC)    Multifocal atrial tachycardia (HCC)    Sarcoidosis 03/15/2019   Pleural effusion 03/15/2019   Elevated troponin 03/15/2019   Cardiomegaly 03/15/2019   Lymphadenopathy, hilar 03/15/2019   Chronic interstitial cystitis 03/15/2019   Sinus tachycardia by electrocardiogram 03/15/2019   Prolonged QT interval 03/15/2019   PCP:  Herold Harms,  Lavonna Rua, PA-C Pharmacy:   [facility] [address] [place], [place] - [address] [place]  [address] Phone: [phone] Fax: [phone]     Social Determinants of Health (SDOH) Interventions    Readmission Risk Interventions No flowsheet data found.

## 2021-07-26 ENCOUNTER — Inpatient Hospital Stay (HOSPITAL_COMMUNITY): Payer: Medicare Other

## 2021-07-26 LAB — BASIC METABOLIC PANEL
Anion gap: 4 — ABNORMAL LOW (ref 5–15)
BUN: 10 mg/dL (ref 8–23)
CO2: 24 mmol/L (ref 22–32)
Calcium: 8.1 mg/dL — ABNORMAL LOW (ref 8.9–10.3)
Chloride: 108 mmol/L (ref 98–111)
Creatinine, Ser: 1 mg/dL (ref 0.44–1.00)
GFR, Estimated: 58 mL/min — ABNORMAL LOW (ref >60)
Glucose, Bld: 85 mg/dL (ref 70–99)
Potassium: 4.2 mmol/L (ref 3.5–5.1)
Sodium: 136 mmol/L (ref 135–145)

## 2021-07-26 LAB — COOXEMETRY PANEL
Carboxyhemoglobin: 1.4 % (ref 0.5–1.5)
Methemoglobin: 0.8 % (ref 0.0–1.5)
O2 Saturation: 71 %
Total hemoglobin: 7.4 g/dL — ABNORMAL LOW (ref 12.0–16.0)

## 2021-07-26 LAB — GLUCOSE, CAPILLARY
Glucose-Capillary: 72 mg/dL (ref 70–99)
Glucose-Capillary: 157 mg/dL — ABNORMAL HIGH (ref 70–99)
Glucose-Capillary: 184 mg/dL — ABNORMAL HIGH (ref 70–99)
Glucose-Capillary: 156 mg/dL — ABNORMAL HIGH (ref 70–99)

## 2021-07-26 IMAGING — CR DG CHEST 2V
2 series · 2 of 2 positions shown · non-contrast
Comparison: [DATE]

CLINICAL DATA: ICD placement.

EXAM:
CHEST - 2 VIEW

[chest lat]
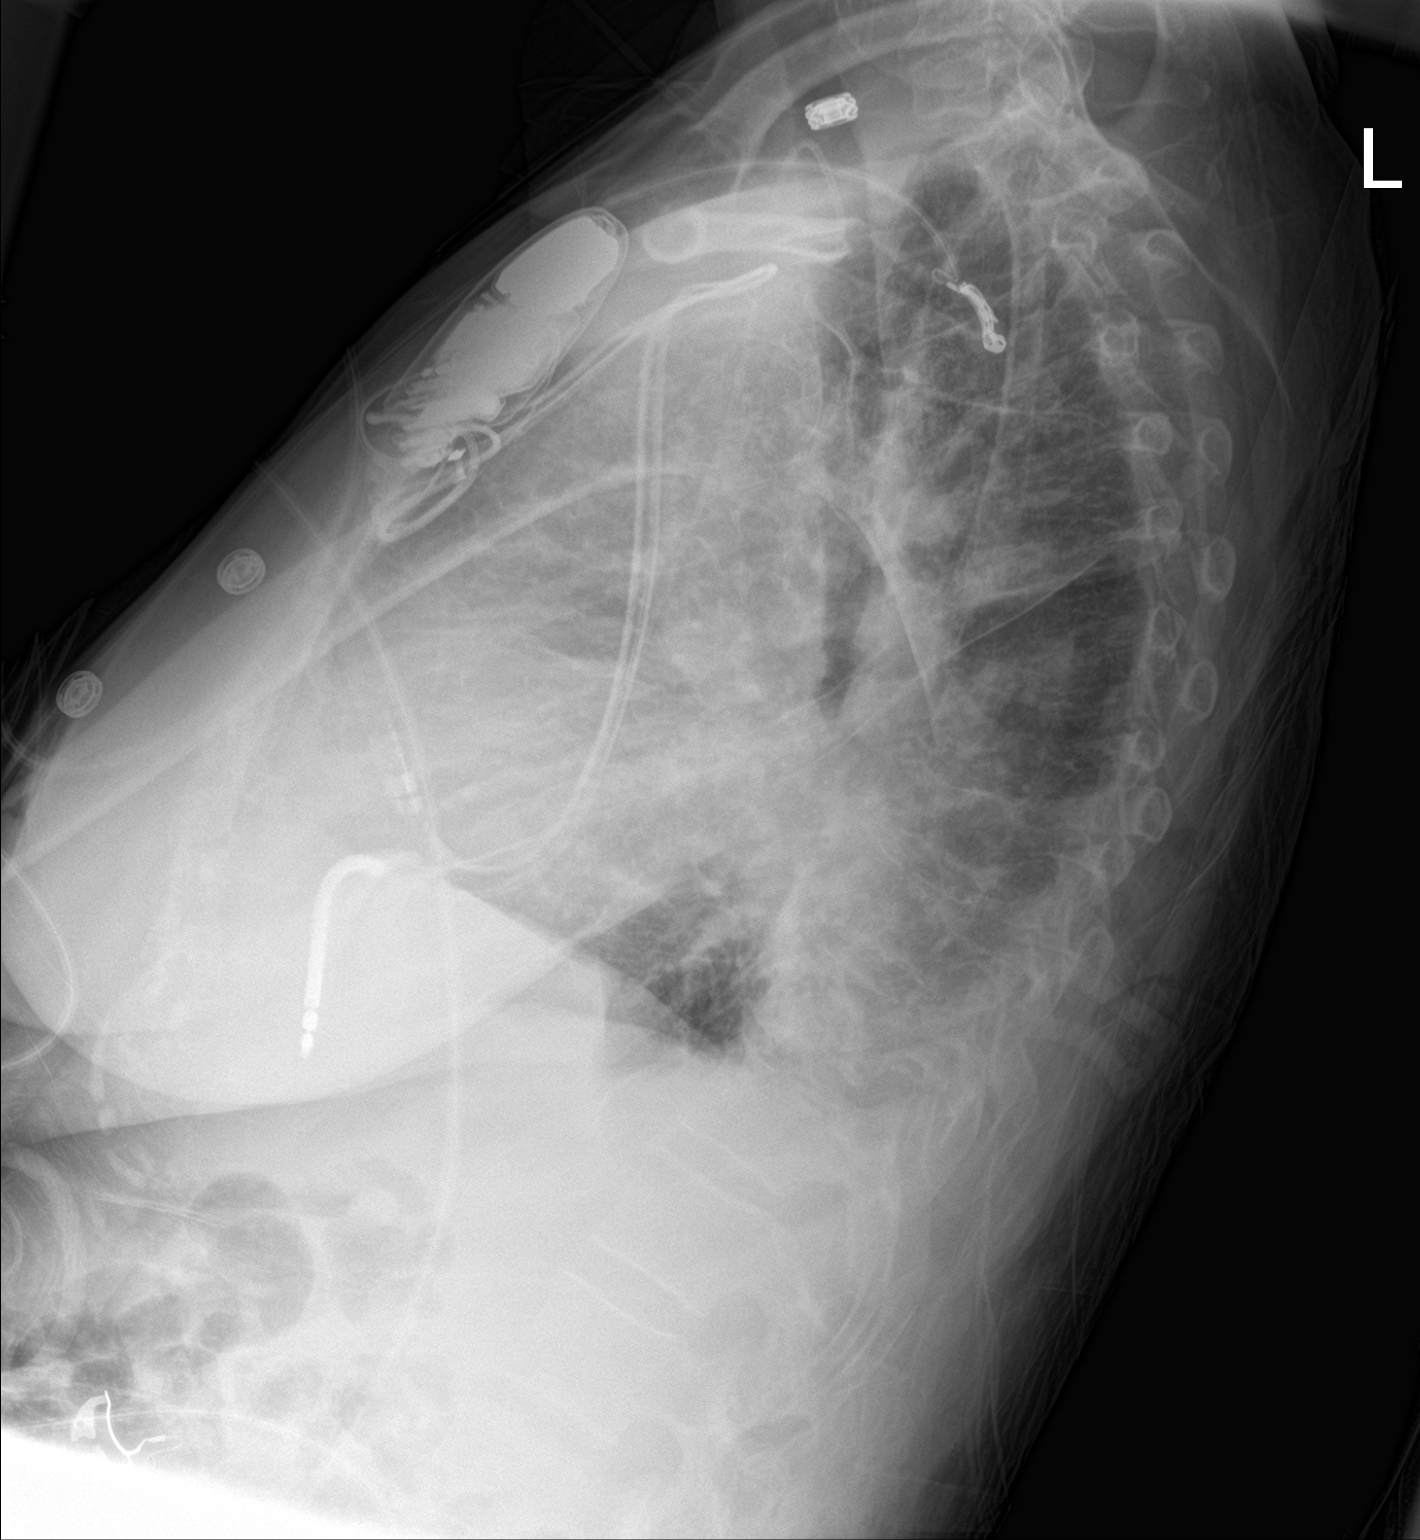

[chest ap]
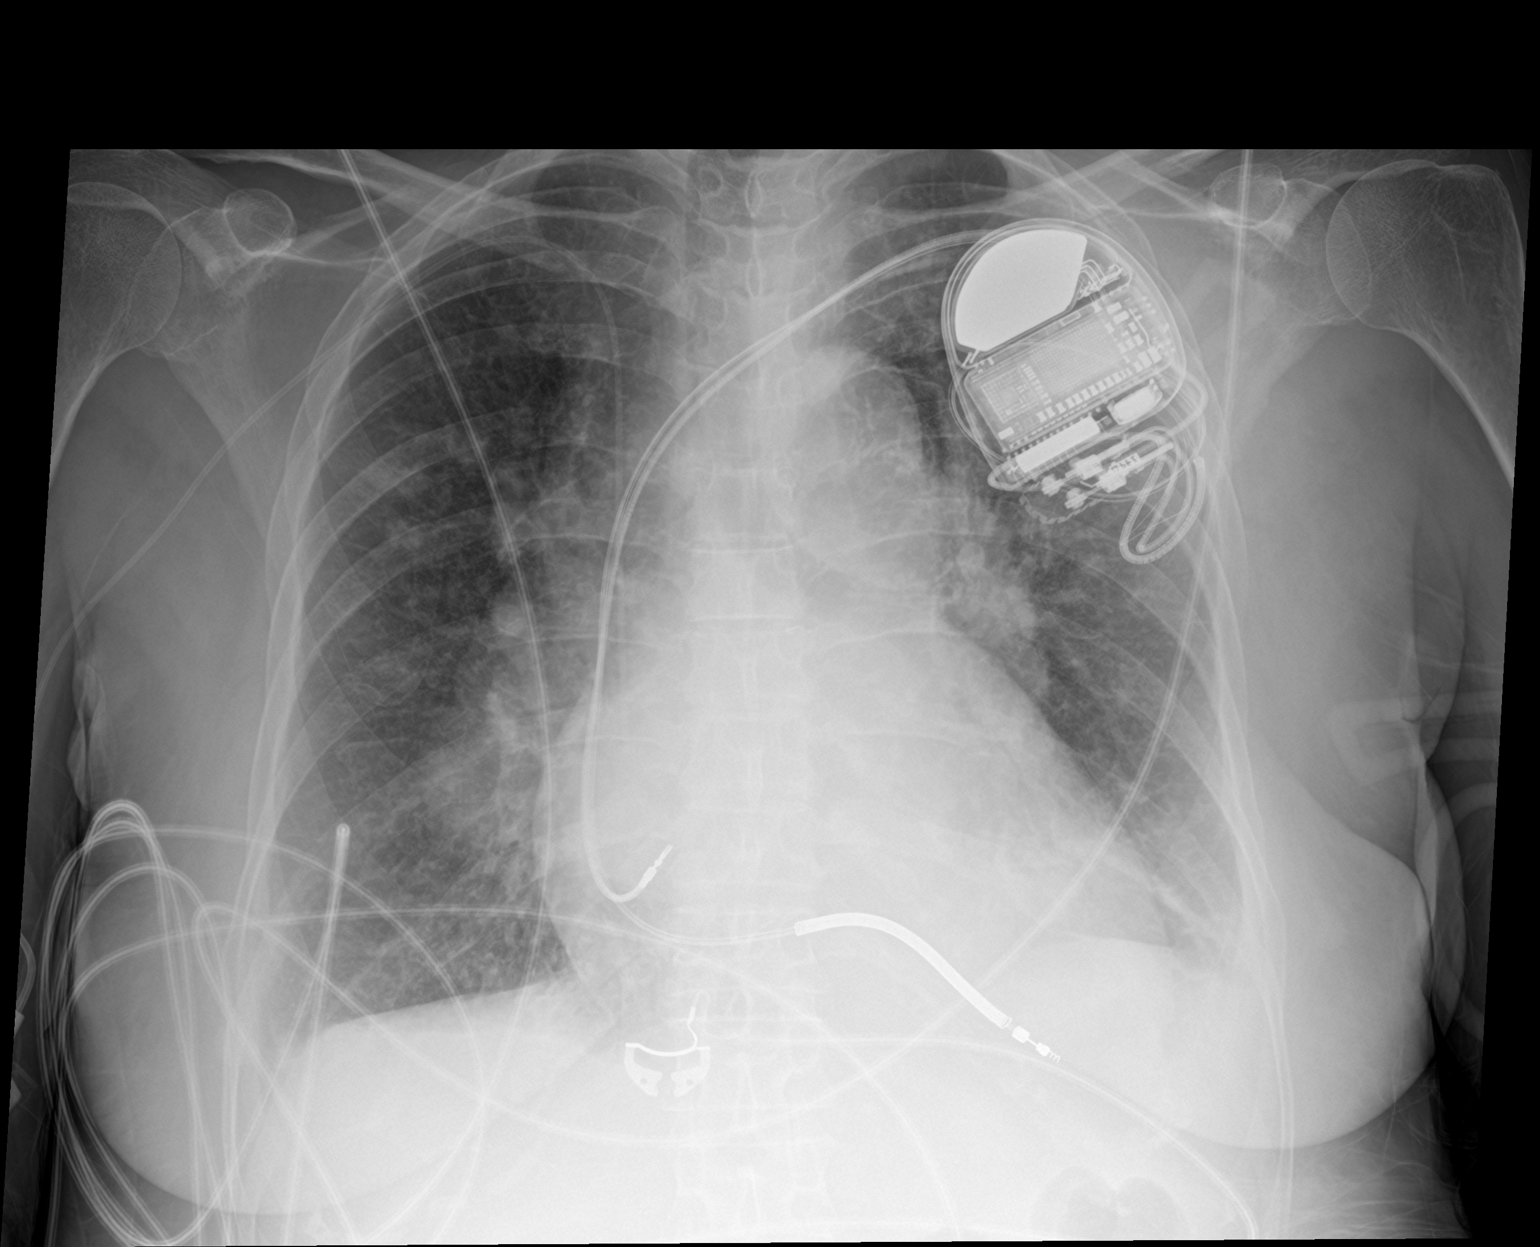

[2 of 2 positions shown; findings below may reference images not displayed]

FINDINGS: The heart is enlarged the pulmonary vasculature is distended. The
mediastinal structures are within normal limits. Patchy interstitial
and airspace opacities are noted bilaterally. There are small
bilateral pleural effusions. No pneumothorax is seen a right PICC
line terminates in the right atrium. A left ICD is noted with leads
terminating in the heart.
IMPRESSION: 1. Left ICD placement.  No pneumothorax.
2. Cardiomegaly with pulmonary vascular congestion.
3. Interstitial and airspace opacities bilaterally suggesting edema,
increased from the prior exam.
4. Small bilateral pleural effusions.

## 2021-07-26 MED ORDER — MIDODRINE HCL 5 MG PO TABS
10.0000 mg | ORAL_TABLET | Freq: Once | ORAL | Status: AC
  Administered 2021-07-26: 10 mg via ORAL
  Filled 2021-07-26: qty 2

## 2021-07-26 MED ORDER — FUROSEMIDE 40 MG PO TABS
40.0000 mg | ORAL_TABLET | Freq: Every day | ORAL | Status: DC
  Administered 2021-07-26: 40 mg via ORAL
  Filled 2021-07-26: qty 1

## 2021-07-26 NOTE — Progress Notes (Deleted)
Physical Therapy Treatment Patient Details Name: Robin Estes MRN: [id number] DOB: 1946/05/23 Today's Date: 07/26/2021   History of Present Illness Pt adm 11/18 with cardiac arrest with V-fib in ED. Pt intubated on 11/18. Extubated 11/20.  Pt had ICD placement on 11/25. PMH - sarcoidosis, HTN, NICM, chf, legally blind rt eye    PT Comments    Pt admitted with above diagnosis. Pt was able to ambulate with min guard assist. Does have DOE 3/4 and sats 88% and greater. Pt tolerated walk but was fatigues. She is safe with the RW/rollator.  Will continue to follow.  Pt currently with functional limitations due to balance and endurance deficits. Pt will benefit from skilled PT to increase their independence and safety with mobility to allow discharge to the venue listed below.      Recommendations for follow up therapy are one component of a multi-disciplinary discharge planning process, led by the attending physician.  Recommendations may be updated based on patient status, additional functional criteria and insurance authorization.  Follow Up Recommendations  Home health PT     Assistance Recommended at Discharge Frequent or constant Supervision/Assistance  Equipment Recommendations  Rollator (4 wheels) (daughter says she can get rollator)    Recommendations for Other Services       Precautions / Restrictions Precautions Precautions: Fall Restrictions Weight Bearing Restrictions: No     Mobility  Bed Mobility Overal bed mobility: Needs Assistance Bed Mobility: Supine to Sit     Supine to sit: Min guard;HOB elevated     General bed mobility comments: A little assist as pt needed cues to limit use of left UE    Transfers Overall transfer level: Needs assistance Equipment used: Rolling walker (2 wheels) Transfers: Sit to/from Stand Sit to Stand: Supervision;Min guard           General transfer comment: Supervision for safety. Amb in hall with RW and supervision. Amb in  room without assistive device and min guard    Ambulation/Gait Ambulation/Gait assistance: Supervision;Min guard Gait Distance (Feet): 225 Feet Assistive device: Rolling walker (2 wheels) Gait Pattern/deviations: Step-through pattern;Decreased stride length;Drifts right/left Gait velocity: decr Gait velocity interpretation: <1.8 ft/sec, indicate of risk for recurrent falls   General Gait Details: assist for safety. No LOB with use of RW. Walked to bathroom and urinated. Was able to clean self. Then walked to hallway.   Stairs             Wheelchair Mobility    Modified Rankin (Stroke Patients Only)       Balance Overall balance assessment: Needs assistance Sitting-balance support: Feet supported;No upper extremity supported Sitting balance-Leahy Scale: Normal     Standing balance support: No upper extremity supported;During functional activity Standing balance-Leahy Scale: Fair Standing balance comment: RW and min guard for static standing                            Cognition Arousal/Alertness: Awake/alert Behavior During Therapy: WFL for tasks assessed/performed Overall Cognitive Status: Within Functional Limits for tasks assessed Area of Impairment: Problem solving;Safety/judgement;Following commands                       Following Commands: Follows multi-step commands with increased time Safety/Judgement: Decreased awareness of deficits   Problem Solving: Slow processing          Exercises      General Comments General comments (skin integrity, edema, etc.): VSS, DOE 3/4  with sats 88% and greater with activty      Pertinent Vitals/Pain Pain Assessment: No/denies pain    Home Living Family/patient expects to be discharged to:: Private residence Living Arrangements: Spouse/significant other Available Help at Discharge: Family;Available 24 hours/day Type of Home: House Home Access: Stairs to enter   [employer] of  Steps: 1   Home Layout: One level Home Equipment: None      Prior Function            PT Goals (current goals can now be found in the care plan section) Acute Rehab PT Goals Patient Stated Goal: back to dancing PT Goal Formulation: With patient/family Time For Goal Achievement: 08/12/21 Potential to Achieve Goals: Good    Frequency    Min 3X/week      PT Plan      Co-evaluation              AM-PAC PT "6 Clicks" Mobility   Outcome Measure  Help needed turning from your back to your side while in a flat bed without using bedrails?: None Help needed moving from lying on your back to sitting on the side of a flat bed without using bedrails?: A Little Help needed moving to and from a bed to a chair (including a wheelchair)?: A Little Help needed standing up from a chair using your arms (e.g., wheelchair or bedside chair)?: A Little Help needed to walk in hospital room?: A Little Help needed climbing 3-5 steps with a railing? : A Little 6 Click Score: 19    End of Session Equipment Utilized During Treatment: Gait belt Activity Tolerance: Patient tolerated treatment well Patient left: in chair;with call bell/phone within reach Nurse Communication: Mobility status PT Visit Diagnosis: Other abnormalities of gait and mobility (R26.89);Muscle weakness (generalized) (M62.81)     Time: [id number] PT Time Calculation (min) (ACUTE ONLY): 21 min  Charges:  $Gait Training: 8-22 mins                     Robin Estes M,PT Acute [place] [phone] (pager)    Robin Estes 07/26/2021, 12:36 PM

## 2021-07-26 NOTE — Progress Notes (Signed)
Progress Note  Patient Name: Robin Estes Date of Encounter: 07/26/2021  [facility] Cardiologist: Armanda Magic, MD   Subjective   NAEO.   Inpatient Medications    Scheduled Meds:  amiodarone  200 mg Oral BID   calcium citrate  200 mg of elemental calcium Oral BID WC   chlorhexidine  15 mL Mouth Rinse BID   Chlorhexidine Gluconate Cloth  6 each Topical Daily   cholecalciferol  1,000 Units Oral Daily   docusate  100 mg Oral BID   famotidine  20 mg Oral Daily   feeding supplement  1 Container Oral TID BM   folic acid  1 mg Oral Daily   guaiFENesin  600 mg Oral BID   insulin aspart  0-5 Units Subcutaneous QHS   insulin aspart  0-9 Units Subcutaneous TID WC   mouth rinse  15 mL Mouth Rinse q12n4p   methotrexate  10 mg Oral Weekly   Followed by   Melene Muller ON 08/06/2021] methotrexate  12.5 mg Oral Weekly   Followed by   Melene Muller ON 08/20/2021] methotrexate  15 mg Oral Weekly   Followed by   Melene Muller ON 09/03/2021] methotrexate  17.5 mg Oral Weekly   Followed by   Melene Muller ON 09/17/2021] methotrexate  20 mg Oral Weekly   midodrine  5 mg Oral TID WC   pantoprazole  20 mg Oral Daily   polyethylene glycol  17 g Oral Daily   predniSONE  30 mg Oral Q breakfast   Followed by   Melene Muller ON 08/20/2021] predniSONE  25 mg Oral Q breakfast   Followed by   Melene Muller ON 09/17/2021] predniSONE  20 mg Oral Q breakfast   Followed by   Melene Muller ON 10/15/2021] predniSONE  15 mg Oral Q breakfast   Followed by   Melene Muller ON 11/12/2021] predniSONE  10 mg Oral Q breakfast   Followed by   Melene Muller ON 11/26/2021] predniSONE  7.5 mg Oral Q breakfast   Followed by   Melene Muller ON 12/10/2021] predniSONE  5 mg Oral Q breakfast   Followed by   Melene Muller ON 12/24/2021] predniSONE  2.5 mg Oral Q breakfast   sacubitril-valsartan  1 tablet Oral BID   sodium chloride flush  10-40 mL Intracatheter Q12H   sodium chloride flush  3 mL Intravenous Q12H   sodium chloride flush  3 mL Intravenous Q12H   sodium chloride flush  3 mL  Intravenous Q12H   spironolactone  12.5 mg Oral Daily   sulfamethoxazole-trimethoprim  1 tablet Oral Once per day on Mon Wed Fri   Continuous Infusions:  sodium chloride 10 mL/hr at 07/26/21 0200    ceFAZolin (ANCEF) IV 1 g (07/26/21 0344)   cefTRIAXone (ROCEPHIN)  IV 1 g (07/25/21 1348)   PRN Meds: sodium chloride, acetaminophen, fentaNYL, midazolam, ondansetron (ZOFRAN) IV, sodium chloride flush, sodium chloride flush   Vital Signs    Vitals:   07/26/21 0300 07/26/21 0500 07/26/21 0609 07/26/21 0754  BP: (!) 85/57 (!) 97/57  111/79  Pulse: 73   70  Resp: 20   17  Temp: 98.1 F (36.7 C)     TempSrc: Oral     SpO2: 95%   99%  Weight:   65.2 kg   Height:        Intake/Output Summary (Last 24 hours) at 07/26/2021 0801 Last data filed at 07/26/2021 0200 Gross per 24 hour  Intake 900.81 ml  Output --  Net 900.81 ml   Last 3 Weights 07/26/2021 07/25/2021  07/24/2021  Weight (lbs) 143 lb 11.8 oz 142 lb 6.7 oz 141 lb 12.1 oz  Weight (kg) 65.2 kg 64.6 kg 64.3 kg      Telemetry    Personally Reviewed  ECG    Personally Reviewed  Physical Exam   GEN: No acute distress.   Neck: No JVD Cardiac: RRR, 3/6 systolic murmur, rubs, or gallops. ICD  pocket without hematoma or significant pain. Respiratory: Clear to auscultation bilaterally. GI: Soft, nontender, non-distended  MS: No edema; No deformity. Neuro:  Nonfocal  Psych: Normal affect   Labs    High Sensitivity Troponin:   Recent Labs  Lab 07/17/21 2216 07/18/21 0015 07/18/21 0229 07/18/21 0429  TROPONINIHS 16 18* 18* 21*     Chemistry Recent Labs  Lab 07/21/21 0456 07/22/21 0256 07/22/21 1645 07/24/21 0500 07/25/21 0455 07/26/21 0340  NA 128* 133*   < > 136 136 136  K 3.7 4.8   < > 4.1 3.6 4.2  CL 97* 103   < > 103 105 108  CO2 24 26   < > 27 26 24   GLUCOSE 287* 143*   < > 106* 89 85  BUN 19 14   < > 13 13 10   CREATININE 1.53* 1.11*   < > 1.01* 0.98 1.00  CALCIUM 7.6* 7.9*   < > 8.2* 8.4* 8.1*   MG 1.9 2.2  --  1.9  --   --   GFRNONAA 35* 52*   < > 58* 60* 58*  ANIONGAP 7 4*   < > 6 5 4*   < > = values in this interval not displayed.    Lipids No results for input(s): CHOL, TRIG, HDL, LABVLDL, LDLCALC, CHOLHDL in the last 168 hours.  Hematology Recent Labs  Lab 07/21/21 0856 07/22/21 1645 07/22/21 1704 07/23/21 0520 07/25/21 0455  WBC 13.2*  --   --  5.7 6.9  RBC 2.98*  --   --  2.81* 2.52*  HGB 8.7*   < > 7.1* 8.4* 7.3*  HCT 27.3*   < > 21.0* 25.5* 23.4*  MCV 91.6  --   --  90.7 92.9  MCH 29.2  --   --  29.9 29.0  MCHC 31.9  --   --  32.9 31.2  RDW 15.3  --   --  16.1* 16.2*  PLT PLATELET CLUMPS NOTED ON SMEAR, UNABLE TO ESTIMATE  --   --  200 199   < > = values in this interval not displayed.   Thyroid No results for input(s): TSH, FREET4 in the last 168 hours.  BNPNo results for input(s): BNP, PROBNP in the last 168 hours.  DDimer No results for input(s): DDIMER in the last 168 hours.   Radiology    DG Chest 2 View  Result Date: 07/26/2021 CLINICAL DATA:  ICD placement. EXAM: CHEST - 2 VIEW COMPARISON:  07/19/2021 FINDINGS: The heart is enlarged the pulmonary vasculature is distended. The mediastinal structures are within normal limits. Patchy interstitial and airspace opacities are noted bilaterally. There are small bilateral pleural effusions. No pneumothorax is seen a right PICC line terminates in the right atrium. A left ICD is noted with leads terminating in the heart. IMPRESSION: 1. Left ICD placement.  No pneumothorax. 2. Cardiomegaly with pulmonary vascular congestion. 3. Interstitial and airspace opacities bilaterally suggesting edema, increased from the prior exam. 4. Small bilateral pleural effusions. Electronically Signed   By: 07/28/2021 M.D.   On: 07/26/2021 04:42   EP  PPM/ICD IMPLANT  Result Date: 07/25/2021 CONCLUSIONS:  1. Non-ischemic cardiomyopathy with sustained VT.  2. Successful ICD implantation.  3. No early apparent complications. ICD  Criteria Current LVEF:35%. Within 12 months prior to implant: Yes Heart failure history: Yes, Class II Cardiomyopathy history: Yes, Non-Ischemic Cardiomyopathy. Atrial Fibrillation/Atrial Flutter: No. Ventricular tachycardia history: Yes, Hemodynamic instability present. VT Type: Sustained Ventricular Tachycardia - Monomorphic. Cardiac arrest history: No. History of syndromes with risk of sudden death: No. Previous ICD: No. Current ICD indication: Secondary PPM indication: No.  Class I or II Bradycardia indication present: No Beta Blocker therapy for 3 or more months: Yes, prescribed. Ace Inhibitor/ARB therapy for 3 or more months: No, medical reason. Lewayne Bunting, MD 3:59 PM 07/25/2021    Cardiac Studies   CXR shows well positioned leads. Device interrogation shows stable lead parameters  Patient Profile     75 y.o. 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 She is now s/p ICD implant doing well.   Assessment & Plan    #VT/VF arrest - s/p ICD 07/25/2021 - device interrogation and CXR OK after implant. - maintain amiodarone 200mg  PO BID for now  #? Cardiac Sarcoidosis Needs outpatient PET - cont steroids/MTX  per protocol  EP to sign off. Please call with any questions/concerns.  For questions or updates, please contact [facility] Please consult [email or web address] for contact info under        Signed, [place], MD  07/26/2021, 8:01 AM

## 2021-07-26 NOTE — Progress Notes (Addendum)
Patient ID: Robin Estes, female   DOB: Apr 28, 1946, 75 y.o.   MRN: [id number]    Advanced Heart Failure Rounding Note   Subjective:    Echo 07/18/20 EF 35-40% Severe MR 11/19 Head CT negative for acute process.  11/20 Extubated to 3 liters [place].  11/21 Dobutamine cut back to 5 mg tid and Norep weaning. Started on midodrine.  11/22 Norepi stopped and dobutamine cut back to 2.5 mcg. 11/25 MDT ICD placed.   CMRI- LVEF 60% RV 60% . LGE basal septal midwall. Nonspecific scar. Recommending cardiac PET.    RHC/LHC with no coronary disease. Low filling pressures. High output on DBA + Noepi.   Off pressors. Co-ox 71%.  SBP 80s-110s on midodrine 10 mg tid.    Feels ok. Denies SOB.  Denies dizziness.     Objective:   Weight Range:  Vital Signs:   Temp:  [97.8 F (36.6 C)-98.2 F (36.8 C)] 98.2 F (36.8 C) (11/26 0754) Pulse Rate:  [59-86] 70 (11/26 0754) Resp:  [8-33] 17 (11/26 0754) BP: (49-124)/(12-79) 111/79 (11/26 0754) SpO2:  [91 %-100 %] 99 % (11/26 0754) Weight:  [65.2 kg] 65.2 kg (11/26 0609) Last BM Date: 07/25/21  Weight change: Filed Weights   07/24/21 0431 07/25/21 0552 07/26/21 0609  Weight: 64.3 kg 64.6 kg 65.2 kg    Intake/Output:   Intake/Output Summary (Last 24 hours) at 07/26/2021 1002 Last data filed at 07/26/2021 0800 Gross per 24 hour  Intake 1080.81 ml  Output --  Net 1080.81 ml    Physical Exam: General: NAD Neck: JVP 9-10 cm, no thyromegaly or thyroid nodule.  Lungs: Clear to auscultation bilaterally with normal respiratory effort. CV: Nondisplaced PMI.  Heart regular S1/S2, no S3/S4, no murmur.  Trace ankle edema.  Abdomen: Soft, nontender, no hepatosplenomegaly, no distention.  Skin: Intact without lesions or rashes.  Neurologic: Alert and oriented x 3.  Psych: Normal affect. Extremities: No clubbing or cyanosis.  HEENT: Normal.    Telemetry: SR 70s personally reviewed.    Labs: Basic Metabolic Panel: Recent Labs  Lab  07/19/21 1039 07/19/21 1911 07/20/21 0315 07/21/21 0456 07/22/21 0256 07/22/21 1645 07/22/21 1704 07/23/21 0520 07/24/21 0500 07/25/21 0455 07/26/21 0340  NA 124* 124*   < > 128* 133*   < > 143 138 136 136 136  K 4.2 4.1   < > 3.7 4.8   < > 3.3* 3.9 4.1 3.6 4.2  CL 94* 95*   < > 97* 103  --   --  104 103 105 108  CO2 19* 19*   < > 24 26  --   --  29 27 26 24   GLUCOSE 476* 439*   < > 287* 143*  --   --  89 106* 89 85  BUN 12 15   < > 19 14  --   --  11 13 13 10   CREATININE 1.89* 2.12*   < > 1.53* 1.11*  --   --  0.98 1.01* 0.98 1.00  CALCIUM 7.6* 7.7*   < > 7.6* 7.9*  --   --  8.0* 8.2* 8.4* 8.1*  MG 2.4 2.2  --  1.9 2.2  --   --   --  1.9  --   --   PHOS  --  3.9  --   --   --   --   --   --   --   --   --    < > = values  in this interval not displayed.    Liver Function Tests: No results for input(s): AST, ALT, ALKPHOS, BILITOT, PROT, ALBUMIN in the last 168 hours. No results for input(s): LIPASE, AMYLASE in the last 168 hours. No results for input(s): AMMONIA in the last 168 hours.  CBC: Recent Labs  Lab 07/21/21 0856 07/22/21 1645 07/22/21 1652 07/22/21 1659 07/22/21 1704 07/23/21 0520 07/25/21 0455  WBC 13.2*  --   --   --   --  5.7 6.9  HGB 8.7*   < > 8.8* 7.5* 7.1* 8.4* 7.3*  HCT 27.3*   < > 26.0* 22.0* 21.0* 25.5* 23.4*  MCV 91.6  --   --   --   --  90.7 92.9  PLT PLATELET CLUMPS NOTED ON SMEAR, UNABLE TO ESTIMATE  --   --   --   --  200 199   < > = values in this interval not displayed.    Cardiac Enzymes: No results for input(s): CKTOTAL, CKMB, CKMBINDEX, TROPONINI in the last 168 hours.  BNP: BNP (last 3 results) Recent Labs    04/18/21 1340 07/18/21 0230  BNP 124.9* 893.2*    ProBNP (last 3 results) No results for input(s): PROBNP in the last 8760 hours.    Other results:  Imaging: DG Chest 2 View  Result Date: 07/26/2021 CLINICAL DATA:  ICD placement. EXAM: CHEST - 2 VIEW COMPARISON:  07/19/2021 FINDINGS: The heart is enlarged the  pulmonary vasculature is distended. The mediastinal structures are within normal limits. Patchy interstitial and airspace opacities are noted bilaterally. There are small bilateral pleural effusions. No pneumothorax is seen a right PICC line terminates in the right atrium. A left ICD is noted with leads terminating in the heart. IMPRESSION: 1. Left ICD placement.  No pneumothorax. 2. Cardiomegaly with pulmonary vascular congestion. 3. Interstitial and airspace opacities bilaterally suggesting edema, increased from the prior exam. 4. Small bilateral pleural effusions. Electronically Signed   By: Brett Fairy M.D.   On: 07/26/2021 04:42   EP PPM/ICD IMPLANT  Result Date: 07/25/2021 CONCLUSIONS:  1. Non-ischemic cardiomyopathy with sustained VT.  2. Successful ICD implantation.  3. No early apparent complications. ICD Criteria Current LVEF:35%. Within 12 months prior to implant: Yes Heart failure history: Yes, Class II Cardiomyopathy history: Yes, Non-Ischemic Cardiomyopathy. Atrial Fibrillation/Atrial Flutter: No. Ventricular tachycardia history: Yes, Hemodynamic instability present. VT Type: Sustained Ventricular Tachycardia - Monomorphic. Cardiac arrest history: No. History of syndromes with risk of sudden death: No. Previous ICD: No. Current ICD indication: Secondary PPM indication: No.  Class I or II Bradycardia indication present: No Beta Blocker therapy for 3 or more months: Yes, prescribed. Ace Inhibitor/ARB therapy for 3 or more months: No, medical reason. Cristopher [place], MD 3:59 PM 07/25/2021     Medications:     Scheduled Medications:  amiodarone  200 mg Oral BID   calcium citrate  200 mg of elemental calcium Oral BID WC   chlorhexidine  15 mL Mouth Rinse BID   Chlorhexidine Gluconate Cloth  6 each Topical Daily   cholecalciferol  1,000 Units Oral Daily   docusate  100 mg Oral BID   famotidine  20 mg Oral Daily   feeding supplement  1 Container Oral TID BM   folic acid  1 mg Oral Daily    furosemide  40 mg Oral Daily   guaiFENesin  600 mg Oral BID   insulin aspart  0-5 Units Subcutaneous QHS   insulin aspart  0-9 Units Subcutaneous TID WC  mouth rinse  15 mL Mouth Rinse q12n4p   methotrexate  10 mg Oral Weekly   Followed by   Derrill Memo ON 08/06/2021] methotrexate  12.5 mg Oral Weekly   Followed by   Derrill Memo ON 08/20/2021] methotrexate  15 mg Oral Weekly   Followed by   Derrill Memo ON 09/03/2021] methotrexate  17.5 mg Oral Weekly   Followed by   Derrill Memo ON 09/17/2021] methotrexate  20 mg Oral Weekly   pantoprazole  20 mg Oral Daily   polyethylene glycol  17 g Oral Daily   predniSONE  30 mg Oral Q breakfast   Followed by   Derrill Memo ON 08/20/2021] predniSONE  25 mg Oral Q breakfast   Followed by   Derrill Memo ON 09/17/2021] predniSONE  20 mg Oral Q breakfast   Followed by   Derrill Memo ON 10/15/2021] predniSONE  15 mg Oral Q breakfast   Followed by   Derrill Memo ON 11/12/2021] predniSONE  10 mg Oral Q breakfast   Followed by   Derrill Memo ON 11/26/2021] predniSONE  7.5 mg Oral Q breakfast   Followed by   Derrill Memo ON 12/10/2021] predniSONE  5 mg Oral Q breakfast   Followed by   Derrill Memo ON 12/24/2021] predniSONE  2.5 mg Oral Q breakfast   sodium chloride flush  10-40 mL Intracatheter Q12H   sodium chloride flush  3 mL Intravenous Q12H   sodium chloride flush  3 mL Intravenous Q12H   sodium chloride flush  3 mL Intravenous Q12H   spironolactone  12.5 mg Oral Daily   sulfamethoxazole-trimethoprim  1 tablet Oral Once per day on Mon Wed Fri    Infusions:  sodium chloride 10 mL/hr at 07/26/21 0200    ceFAZolin (ANCEF) IV 1 g (07/26/21 0344)   cefTRIAXone (ROCEPHIN)  IV 1 g (07/25/21 1348)    PRN Medications: sodium chloride, acetaminophen, fentaNYL, midazolam, ondansetron (ZOFRAN) IV, sodium chloride flush, sodium chloride flush   Assessment/Plan:   1. 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 Lido now off - Continue amio 200 mg twice a day.  - Keep K > 4.0 and Mg > 2.0 - Cath without coronary disease. cMRI wit LGE possible sarcoid. Need cardiac PET as an outpatient.  - MDT ICD 11/25.  - Continue steroid protocol.    2. 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 RV normal. Minimal LGE -> doubt significant cardiac sarcoid - Echo 12/20 EF ~45% MV appears thickened and possible rheumatic with 3+ MR. Severe LAE. Mod TR.  - TEE 8/21: LVEF 40-45% w/ mild global HK moderate to severe functional central MR. - Echo 07/18/20 EF 35-40% Severe MR - Developed cardiogenic shock on 11/19 with co-ox 42% - CMRI LV EF 60% RV 60%. LVEF 60% RV 60% . LGE basal septal midwall. Nonspecific scar. Recommending cardiac PET.   - Off inotropes. Co-ox 71%. Mild volume overload on exam.  - Will start Lasix 40 mg po daily, follow response.  - BP ok but dips low at times.  Today, will stop both midodrine and Entresto (EF 60% by echo).  - Continue spiro 12.5 daily.  - Has been unable to tolerate Farxiga   3. Acute hypoxic respiratory failure in setting of #1 - Extubated 07/20/21 to 3 liters. - Stable on RA  4. MAT - Zio in 9/20  1. Sinus rhythm - avg HR of 82      2. 22 Supraventricular Tachycardia runs occurred, the run with the fastest interval lasting 5 beats with a max rate of 184 bpm, the longest lasting 10.9 secs with an avg rate of 162 bpm.     3. Isolated PACs were occasional (4.7%, 18936)     4. Rare PVCs (<1.0%) - currently in NSR  5. 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 - She had been referred for possible [employer]. Did not keep appointment last fall due to gap in insurance coverage - Severe MR on echo but moderate on cMRI    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 Everardo All in Pulmonary.  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  - Plan as  above. Steroids started - MRI -EF 60% normal RV. LGE basal septal midwall. Nonspecific scar. Recommending cardiac PET.   - Cardiac Pet after D/C [facility] Immunosuppressive Therapy for Cardiac Sarcoid  -Start date 11/23 Prednisone 30 mg daily  -Start date 11/23 Methotrexate 10 mg (four 2.5 mg tablets) once weekly Additional Medications  Bactrim DS : 1 tablet Mon-Wed- Fri while taking >15 mg of prednisone daily  Folic Acid : 1 mg daily  Calcium: 1 tab  BID with meals  Vitamin D: 1 tab daily  Protonix 20 mg daily Immunizations- Pneumonia 2016    -Flu ordered        7. Hypokalemia/hypomagnesemia - Keep K > 4.0 Mg > 2.0 - Supp K   8. AKI due to ATN/shock - baseline, creatinine 1.0 today.  - Resolved.   9. Hyperglycemia - likely steroid-induced - SSI  Continue to mobilize, follow today off midodrine and Entresto.    Length of Stay: 8   Marca Ancona  07/26/2021, 10:02 AM  Advanced Heart Failure Team Pager [phone] (M-F; 7a - 4p)  Please contact [facility] for night-coverage after hours (4p -7a )

## 2021-07-26 NOTE — Evaluation (Addendum)
Physical Therapy Re- Evaluation Patient Details Name: Robin Estes MRN: [id number] DOB: [birth year] Today's Date: 07/26/2021  History of Present Illness  Pt adm 11/18 with cardiac arrest with V-fib in ED. Pt intubated on 11/18. Extubated 11/20.  Pt had ICD placement on 11/25. PMH - sarcoidosis, HTN, NICM, chf, legally blind rt eye  Clinical Impression  Pt admitted with above diagnosis. Pt was able to ambulate with min guard assist. Does have DOE 3/4 and sats 88% and greater. Pt tolerated walk but was fatigues. She is safe with the RW/rollator.  continue toward original goals set.  Will continue to follow.  Pt currently with functional limitations due to balance and endurance deficits. Pt will benefit from skilled PT to increase their independence and safety with mobility to allow discharge to the venue listed below.           Recommendations for follow up therapy are one component of a multi-disciplinary discharge planning process, led by the attending physician.  Recommendations may be updated based on patient status, additional functional criteria and insurance authorization.  Follow Up Recommendations Home health PT    Assistance Recommended at Discharge Frequent or constant Supervision/Assistance  Functional Status Assessment Patient has had a recent decline in their functional status and demonstrates the ability to make significant improvements in function in a reasonable and predictable amount of time.  Equipment Recommendations  Rollator (4 wheels) (daughter says she can get rollator)    Recommendations for Other Services       Precautions / Restrictions Precautions Precautions: Fall Restrictions Weight Bearing Restrictions: No      Mobility  Bed Mobility Overal bed mobility: Needs Assistance Bed Mobility: Supine to Sit     Supine to sit: Min guard;HOB elevated     General bed mobility comments: A little assist as pt needed cues to limit use of left UE     Transfers Overall transfer level: Needs assistance Equipment used: Rolling walker (2 wheels) Transfers: Sit to/from Stand Sit to Stand: Supervision;Min guard           General transfer comment: Supervision for safety. Amb in hall with RW and supervision. Amb in room without assistive device and min guard    Ambulation/Gait Ambulation/Gait assistance: Supervision;Min guard Gait Distance (Feet): 225 Feet Assistive device: Rolling walker (2 wheels) Gait Pattern/deviations: Step-through pattern;Decreased stride length;Drifts right/left Gait velocity: decr Gait velocity interpretation: <1.8 ft/sec, indicate of risk for recurrent falls   General Gait Details: assist for safety. No LOB with use of RW. Walked to bathroom and urinated. Was able to clean self. Then walked to hallway.  Stairs            Wheelchair Mobility    Modified Rankin (Stroke Patients Only)       Balance Overall balance assessment: Needs assistance Sitting-balance support: Feet supported;No upper extremity supported Sitting balance-Leahy Scale: Normal     Standing balance support: No upper extremity supported;During functional activity Standing balance-Leahy Scale: Fair Standing balance comment: RW and min guard for static standing                             Pertinent Vitals/Pain Pain Assessment: No/denies pain    Home Living Family/patient expects to be discharged to:: Private residence Living Arrangements: Spouse/significant other Available Help at Discharge: Family;Available 24 hours/day Type of Home: House Home Access: Stairs to enter   [employer] of Steps: 1   Home Layout: One level Home Equipment:  None      Prior Function Prior Level of Function : Independent/Modified Independent             Mobility Comments: No assistive device ADLs Comments: No assist for ADL     Hand Dominance   Dominant Hand: Right    Extremity/Trunk Assessment    Upper Extremity Assessment Upper Extremity Assessment: Generalized weakness;LUE deficits/detail LUE Deficits / Details: Use of sling for 24 hours after surgery, limit use per protocol for next 5 days    Lower Extremity Assessment Lower Extremity Assessment: Generalized weakness    Cervical / Trunk Assessment Cervical / Trunk Assessment: Normal  Communication   Communication: No difficulties  Cognition Arousal/Alertness: Awake/alert Behavior During Therapy: WFL for tasks assessed/performed Overall Cognitive Status: Within Functional Limits for tasks assessed Area of Impairment: Problem solving;Safety/judgement;Following commands                       Following Commands: Follows multi-step commands with increased time Safety/Judgement: Decreased awareness of deficits   Problem Solving: Slow processing          General Comments General comments (skin integrity, edema, etc.): VSS, DOE 3/4 with sats 88% and greater with activty    Exercises     Assessment/Plan    PT Assessment    PT Problem List Decreased strength;Decreased balance;Decreased cognition;Decreased mobility;Decreased activity tolerance;Decreased safety awareness       PT Treatment Interventions DME instruction;Functional mobility training;Balance training;Patient/family education;Gait training;Therapeutic activities;Stair training;Therapeutic exercise    PT Goals (Current goals can be found in the Care Plan section)  Acute Rehab PT Goals Patient Stated Goal: back to dancing PT Goal Formulation: With patient/family Time For Goal Achievement: 08/12/21 Potential to Achieve Goals: Good    Frequency Min 3X/week   Barriers to discharge        Co-evaluation               AM-PAC PT "6 Clicks" Mobility  Outcome Measure Help needed turning from your back to your side while in a flat bed without using bedrails?: None Help needed moving from lying on your back to sitting on the side of a flat bed  without using bedrails?: A Little Help needed moving to and from a bed to a chair (including a wheelchair)?: A Little Help needed standing up from a chair using your arms (e.g., wheelchair or bedside chair)?: A Little Help needed to walk in hospital room?: A Little Help needed climbing 3-5 steps with a railing? : A Little 6 Click Score: 19    End of Session Equipment Utilized During Treatment: Gait belt Activity Tolerance: Patient tolerated treatment well Patient left: in chair;with call bell/phone within reach Nurse Communication: Mobility status PT Visit Diagnosis: Other abnormalities of gait and mobility (R26.89);Muscle weakness (generalized) (M62.81)    Time: 3419-3790 PT Time Calculation (min) (ACUTE ONLY): 21 min   Charges:   PT Evaluation $PT Re-evaluation: 1 Re-eval          Hennessy Bartel M,PT Acute Rehab Services [phone] (pager)   Bevelyn Buckles 07/26/2021, 12:40 PM

## 2021-07-27 LAB — GLUCOSE, CAPILLARY
Glucose-Capillary: 86 mg/dL (ref 70–99)
Glucose-Capillary: 155 mg/dL — ABNORMAL HIGH (ref 70–99)
Glucose-Capillary: 116 mg/dL — ABNORMAL HIGH (ref 70–99)
Glucose-Capillary: 102 mg/dL — ABNORMAL HIGH (ref 70–99)

## 2021-07-27 LAB — COOXEMETRY PANEL
Carboxyhemoglobin: 1.3 % (ref 0.5–1.5)
Methemoglobin: 0.7 % (ref 0.0–1.5)
O2 Saturation: 62.6 %
Total hemoglobin: 10 g/dL — ABNORMAL LOW (ref 12.0–16.0)

## 2021-07-27 LAB — BASIC METABOLIC PANEL
Anion gap: 9 (ref 5–15)
BUN: 8 mg/dL (ref 8–23)
CO2: 25 mmol/L (ref 22–32)
Calcium: 8.3 mg/dL — ABNORMAL LOW (ref 8.9–10.3)
Chloride: 103 mmol/L (ref 98–111)
Creatinine, Ser: 0.98 mg/dL (ref 0.44–1.00)
GFR, Estimated: 60 mL/min — ABNORMAL LOW (ref >60)
Glucose, Bld: 80 mg/dL (ref 70–99)
Potassium: 3.5 mmol/L (ref 3.5–5.1)
Sodium: 137 mmol/L (ref 135–145)

## 2021-07-27 MED ORDER — MIDODRINE HCL 5 MG PO TABS
5.0000 mg | ORAL_TABLET | Freq: Three times a day (TID) | ORAL | Status: DC
  Administered 2021-07-27 – 2021-07-28 (×4): 5 mg via ORAL
  Filled 2021-07-27 (×4): qty 1

## 2021-07-27 MED ORDER — DOCUSATE SODIUM 100 MG PO CAPS
100.0000 mg | ORAL_CAPSULE | Freq: Two times a day (BID) | ORAL | Status: DC
  Administered 2021-07-27: 21:00:00 100 mg via ORAL
  Filled 2021-07-27 (×2): qty 1

## 2021-07-27 MED ORDER — FUROSEMIDE 20 MG PO TABS
20.0000 mg | ORAL_TABLET | Freq: Every day | ORAL | Status: DC
  Administered 2021-07-27 – 2021-07-28 (×2): 20 mg via ORAL
  Filled 2021-07-27 (×2): qty 1

## 2021-07-27 MED ORDER — POTASSIUM CHLORIDE CRYS ER 20 MEQ PO TBCR
40.0000 meq | EXTENDED_RELEASE_TABLET | Freq: Once | ORAL | Status: AC
  Administered 2021-07-27: 11:00:00 40 meq via ORAL
  Filled 2021-07-27: qty 2

## 2021-07-27 NOTE — Progress Notes (Signed)
Mobility Specialist Progress Note:   07/27/21 0937  Mobility  Activity Ambulated in hall  Level of Assistance Modified independent, requires aide device or extra time  Assistive Device Front wheel walker  Distance Ambulated (ft) 340 ft  Mobility Ambulated with assistance in hallway  Mobility Response Tolerated well  Mobility performed by Mobility specialist  Bed Position Chair  $Mobility charge 1 Mobility   Pre- Mobility:  85/50 BP Post Mobility: 157/75 BP  Pt received in chair willing to participate in mobility. No comaplints of pain. Pt returned to chair with call bell in reach and all needs met.   [facility] Public librarian Phone [phone] Secondary Phone [phone]

## 2021-07-27 NOTE — Progress Notes (Signed)
Patient ID: Robin Estes, female   DOB: November 21, 1945, 75 y.o.   MRN: [id number]    Advanced Heart Failure Rounding Note   Subjective:    Echo 07/18/20 EF 35-40% Severe MR 11/19 Head CT negative for acute process.  11/20 Extubated to 3 liters [place].  11/21 Dobutamine cut back to 5 mg tid and Norep weaning. Started on midodrine.  11/22 Norepi stopped and dobutamine cut back to 2.5 mcg. 11/25 MDT ICD placed.   CMRI- LVEF 60% RV 60% . LGE basal septal midwall. Nonspecific scar. Recommending cardiac PET.    RHC/LHC with no coronary disease. Low filling pressures. High output on DBA + Noepi.   Off pressors. Co-ox 63%.  SBP has been lower in 80s today.  Both Entresto and midodrine stopped yesterday.  She denies lightheadedness.   No dyspnea.  Has walked with assistance.     Objective:   Weight Range:  Vital Signs:   Temp:  [97.8 F (36.6 C)-98.4 F (36.9 C)] 98 F (36.7 C) (11/27 0227) Pulse Rate:  [65-81] 76 (11/27 0227) Resp:  [11-28] 20 (11/27 0227) BP: (70-102)/(40-56) 73/40 (11/27 0227) SpO2:  [94 %-99 %] 98 % (11/27 0227) Weight:  [63 kg] 63 kg (11/27 0227) Last BM Date: 07/25/21  Weight change: Filed Weights   07/25/21 0552 07/26/21 0609 07/27/21 0227  Weight: 64.6 kg 65.2 kg 63 kg    Intake/Output:   Intake/Output Summary (Last 24 hours) at 07/27/2021 0917 Last data filed at 07/27/2021 0233 Gross per 24 hour  Intake 486.05 ml  Output 1700 ml  Net -1213.95 ml    Physical Exam: General: NAD Neck: JVP 8 cm, no thyromegaly or thyroid nodule.  Lungs: Clear to auscultation bilaterally with normal respiratory effort. CV: Nondisplaced PMI.  Heart regular S1/S2, no S3/S4, 2/6 HSM apex/LLSB.  No peripheral edema.   Abdomen: Soft, nontender, no hepatosplenomegaly, no distention.  Skin: Intact without lesions or rashes.  Neurologic: Alert and oriented x 3.  Psych: Normal affect. Extremities: No clubbing or cyanosis.  HEENT: Normal.   Telemetry: SR 70s personally  reviewed.    Labs: Basic Metabolic Panel: Recent Labs  Lab 07/21/21 0456 07/22/21 0256 07/22/21 1645 07/23/21 0520 07/24/21 0500 07/25/21 0455 07/26/21 0340 07/27/21 0530  NA 128* 133*   < > 138 136 136 136 137  K 3.7 4.8   < > 3.9 4.1 3.6 4.2 3.5  CL 97* 103  --  104 103 105 108 103  CO2 24 26  --  29 27 26 24 25   GLUCOSE 287* 143*  --  89 106* 89 85 80  BUN 19 14  --  11 13 13 10 8   CREATININE 1.53* 1.11*  --  0.98 1.01* 0.98 1.00 0.98  CALCIUM 7.6* 7.9*  --  8.0* 8.2* 8.4* 8.1* 8.3*  MG 1.9 2.2  --   --  1.9  --   --   --    < > = values in this interval not displayed.    Liver Function Tests: No results for input(s): AST, ALT, ALKPHOS, BILITOT, PROT, ALBUMIN in the last 168 hours. No results for input(s): LIPASE, AMYLASE in the last 168 hours. No results for input(s): AMMONIA in the last 168 hours.  CBC: Recent Labs  Lab 07/21/21 0856 07/22/21 1645 07/22/21 1652 07/22/21 1659 07/22/21 1704 07/23/21 0520 07/25/21 0455  WBC 13.2*  --   --   --   --  5.7 6.9  HGB 8.7*   < > 8.8*  7.5* 7.1* 8.4* 7.3*  HCT 27.3*   < > 26.0* 22.0* 21.0* 25.5* 23.4*  MCV 91.6  --   --   --   --  90.7 92.9  PLT PLATELET CLUMPS NOTED ON SMEAR, UNABLE TO ESTIMATE  --   --   --   --  200 199   < > = values in this interval not displayed.    Cardiac Enzymes: No results for input(s): CKTOTAL, CKMB, CKMBINDEX, TROPONINI in the last 168 hours.  BNP: BNP (last 3 results) Recent Labs    04/18/21 1340 07/18/21 0230  BNP 124.9* 893.2*    ProBNP (last 3 results) No results for input(s): PROBNP in the last 8760 hours.    Other results:  Imaging: DG Chest 2 View  Result Date: 07/26/2021 CLINICAL DATA:  ICD placement. EXAM: CHEST - 2 VIEW COMPARISON:  07/19/2021 FINDINGS: The heart is enlarged the pulmonary vasculature is distended. The mediastinal structures are within normal limits. Patchy interstitial and airspace opacities are noted bilaterally. There are small bilateral  pleural effusions. No pneumothorax is seen a right PICC line terminates in the right atrium. A left ICD is noted with leads terminating in the heart. IMPRESSION: 1. Left ICD placement.  No pneumothorax. 2. Cardiomegaly with pulmonary vascular congestion. 3. Interstitial and airspace opacities bilaterally suggesting edema, increased from the prior exam. 4. Small bilateral pleural effusions. Electronically Signed   By: Brett Fairy M.D.   On: 07/26/2021 04:42   EP PPM/ICD IMPLANT  Result Date: 07/25/2021 CONCLUSIONS:  1. Non-ischemic cardiomyopathy with sustained VT.  2. Successful ICD implantation.  3. No early apparent complications. ICD Criteria Current LVEF:35%. Within 12 months prior to implant: Yes Heart failure history: Yes, Class II Cardiomyopathy history: Yes, Non-Ischemic Cardiomyopathy. Atrial Fibrillation/Atrial Flutter: No. Ventricular tachycardia history: Yes, Hemodynamic instability present. VT Type: Sustained Ventricular Tachycardia - Monomorphic. Cardiac arrest history: No. History of syndromes with risk of sudden death: No. Previous ICD: No. Current ICD indication: Secondary PPM indication: No.  Class I or II Bradycardia indication present: No Beta Blocker therapy for 3 or more months: Yes, prescribed. Ace Inhibitor/ARB therapy for 3 or more months: No, medical reason. Cristopher Peru, MD 3:59 PM 07/25/2021     Medications:     Scheduled Medications:  amiodarone  200 mg Oral BID   calcium citrate  200 mg of elemental calcium Oral BID WC   chlorhexidine  15 mL Mouth Rinse BID   Chlorhexidine Gluconate Cloth  6 each Topical Daily   cholecalciferol  1,000 Units Oral Daily   docusate  100 mg Oral BID   famotidine  20 mg Oral Daily   feeding supplement  1 Container Oral TID BM   folic acid  1 mg Oral Daily   furosemide  20 mg Oral Daily   guaiFENesin  600 mg Oral BID   insulin aspart  0-5 Units Subcutaneous QHS   insulin aspart  0-9 Units Subcutaneous TID WC   mouth rinse  15 mL  Mouth Rinse q12n4p   methotrexate  10 mg Oral Weekly   Followed by   Derrill Memo ON 08/06/2021] methotrexate  12.5 mg Oral Weekly   Followed by   Derrill Memo ON 08/20/2021] methotrexate  15 mg Oral Weekly   Followed by   Derrill Memo ON 09/03/2021] methotrexate  17.5 mg Oral Weekly   Followed by   Derrill Memo ON 09/17/2021] methotrexate  20 mg Oral Weekly   midodrine  5 mg Oral TID WC   pantoprazole  20  mg Oral Daily   polyethylene glycol  17 g Oral Daily   potassium chloride  40 mEq Oral Once   predniSONE  30 mg Oral Q breakfast   Followed by   Melene Muller ON 08/20/2021] predniSONE  25 mg Oral Q breakfast   Followed by   Melene Muller ON 09/17/2021] predniSONE  20 mg Oral Q breakfast   Followed by   Melene Muller ON 10/15/2021] predniSONE  15 mg Oral Q breakfast   Followed by   Melene Muller ON 11/12/2021] predniSONE  10 mg Oral Q breakfast   Followed by   Melene Muller ON 11/26/2021] predniSONE  7.5 mg Oral Q breakfast   Followed by   Melene Muller ON 12/10/2021] predniSONE  5 mg Oral Q breakfast   Followed by   Melene Muller ON 12/24/2021] predniSONE  2.5 mg Oral Q breakfast   sodium chloride flush  10-40 mL Intracatheter Q12H   sodium chloride flush  3 mL Intravenous Q12H   sodium chloride flush  3 mL Intravenous Q12H   sodium chloride flush  3 mL Intravenous Q12H   spironolactone  12.5 mg Oral Daily   sulfamethoxazole-trimethoprim  1 tablet Oral Once per day on Mon Wed Fri    Infusions:  sodium chloride Stopped (07/26/21 1840)   cefTRIAXone (ROCEPHIN)  IV Stopped (07/26/21 1107)    PRN Medications: sodium chloride, acetaminophen, fentaNYL, midazolam, ondansetron (ZOFRAN) IV, sodium chloride flush, sodium chloride flush   Assessment/Plan:   1. 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 Lido now off - Continue amio 200 mg twice a day.  - Keep K > 4.0 and Mg > 2.0 - Cath without coronary disease. cMRI with LGE  possible sarcoid. Need cardiac PET as an outpatient.  - MDT ICD 11/25.  - Continue steroid/MTX protocol.    2. 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 RV normal. Minimal LGE -> doubt significant cardiac sarcoid - Echo 12/20 EF ~45% MV appears thickened and possible rheumatic with 3+ MR. Severe LAE. Mod TR.  - TEE 8/21: LVEF 40-45% w/ mild global HK moderate to severe functional central MR. - Echo 07/18/20 EF 35-40% Severe MR - Developed cardiogenic shock on 11/19 with co-ox 42% - CMRI LV EF 60% RV 60%. LVEF 60% RV 60% . LGE basal septal midwall. Nonspecific scar. Recommending cardiac PET.   - Off inotropes. Co-ox 63%.  - Volume status looks better today, decrease Lasix to 20 mg daily, may just need prn at home.   - Now off both midodrine and Entresto, and BP running low though she is asymptomatic.  Will restart midodrine 5 mg tid and watch today.  If BP stabilizes, she can probably go home tomorrow.   - Continue spiro 12.5 daily.  - Has been unable to tolerate Farxiga   3. Acute hypoxic respiratory failure in setting of #1 - Extubated 07/20/21 to 3 liters. - Stable on RA  4. MAT - Zio in 9/20       1. Sinus rhythm - avg HR of 82      2. 22 Supraventricular Tachycardia runs occurred, the run with the fastest interval lasting 5 beats with a max rate of 184 bpm, the longest lasting 10.9 secs with an avg rate of 162 bpm.     3. Isolated PACs were occasional (4.7%, 18936)     4. Rare  PVCs (<1.0%) - currently in NSR  5. 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 - She had been referred for possible [employer]. Did not keep appointment last fall due to gap in insurance coverage - Severe MR on echo but moderate on cMRI    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 Loanne Drilling in Pulmonary.  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  - Plan as above. Steroids started - MRI -EF 60% normal  RV. LGE basal septal midwall. Nonspecific scar. Recommending cardiac PET.   - Cardiac Pet after D/C [facility] Immunosuppressive Therapy for Cardiac Sarcoid  -Start date 11/23 Prednisone 30 mg daily  -Start date 11/23 Methotrexate 10 mg (four 2.5 mg tablets) once weekly Additional Medications  Bactrim DS : 1 tablet Mon-Wed- Fri while taking >15 mg of prednisone daily  Folic Acid : 1 mg daily  Calcium: 1 tab  BID with meals  Vitamin D: 1 tab daily  Protonix 20 mg daily Immunizations- Pneumonia 2016    -Flu ordered        7. Hypokalemia/hypomagnesemia - Keep K > 4.0 Mg > 2.0 - Supp K   8. AKI due to ATN/shock - baseline, creatinine 0.98 today.  - Resolved.   9. Hyperglycemia - likely steroid-induced - SSI  Continue to mobilize, follow today back on midodrine.  If BP stabilizes, home tomorrow.    Length of Stay: [place]  07/27/2021, 9:17 AM  Advanced Heart Failure Team Pager [phone] (M-F; 7a - 4p)  Please contact [facility] for night-coverage after hours (4p -7a )

## 2021-07-28 ENCOUNTER — Other Ambulatory Visit (HOSPITAL_COMMUNITY): Payer: Self-pay

## 2021-07-28 DIAGNOSIS — I959 Hypotension, unspecified: Secondary | ICD-10-CM

## 2021-07-28 LAB — CBC
HCT: 24.2 % — ABNORMAL LOW (ref 36.0–46.0)
Hemoglobin: 7.7 g/dL — ABNORMAL LOW (ref 12.0–15.0)
MCH: 29.6 pg (ref 26.0–34.0)
MCHC: 31.8 g/dL (ref 30.0–36.0)
MCV: 93.1 fL (ref 80.0–100.0)
Platelets: 178 10*3/uL (ref 150–400)
RBC: 2.6 MIL/uL — ABNORMAL LOW (ref 3.87–5.11)
RDW: 16.3 % — ABNORMAL HIGH (ref 11.5–15.5)
WBC: 8 10*3/uL (ref 4.0–10.5)
nRBC: 0 % (ref 0.0–0.2)

## 2021-07-28 LAB — BASIC METABOLIC PANEL
Anion gap: 7 (ref 5–15)
BUN: 10 mg/dL (ref 8–23)
CO2: 25 mmol/L (ref 22–32)
Calcium: 8.8 mg/dL — ABNORMAL LOW (ref 8.9–10.3)
Chloride: 104 mmol/L (ref 98–111)
Creatinine, Ser: 1.03 mg/dL — ABNORMAL HIGH (ref 0.44–1.00)
GFR, Estimated: 56 mL/min — ABNORMAL LOW (ref >60)
Glucose, Bld: 151 mg/dL — ABNORMAL HIGH (ref 70–99)
Potassium: 3.3 mmol/L — ABNORMAL LOW (ref 3.5–5.1)
Sodium: 136 mmol/L (ref 135–145)

## 2021-07-28 LAB — GLUCOSE, CAPILLARY
Glucose-Capillary: 85 mg/dL (ref 70–99)
Glucose-Capillary: 124 mg/dL — ABNORMAL HIGH (ref 70–99)

## 2021-07-28 LAB — TYPE AND SCREEN
ABO/RH(D): O POS
Antibody Screen: NEGATIVE

## 2021-07-28 LAB — COOXEMETRY PANEL
Carboxyhemoglobin: 1.5 % (ref 0.5–1.5)
Methemoglobin: 0.7 % (ref 0.0–1.5)
O2 Saturation: 65 %
Total hemoglobin: 7 g/dL — ABNORMAL LOW (ref 12.0–16.0)

## 2021-07-28 MED ORDER — FUROSEMIDE 20 MG PO TABS
20.0000 mg | ORAL_TABLET | Freq: Every day | ORAL | 3 refills | Status: DC
  Filled 2021-07-28: qty 30, 30d supply, fill #0

## 2021-07-28 MED ORDER — MIDODRINE HCL 2.5 MG PO TABS
2.5000 mg | ORAL_TABLET | Freq: Three times a day (TID) | ORAL | 5 refills | Status: DC
  Filled 2021-07-28: qty 90, 30d supply, fill #0

## 2021-07-28 MED ORDER — FOLIC ACID 1 MG PO TABS
1.0000 mg | ORAL_TABLET | Freq: Every day | ORAL | 3 refills | Status: DC
  Filled 2021-07-28: qty 30, 30d supply, fill #0

## 2021-07-28 MED ORDER — AMIODARONE HCL 200 MG PO TABS
ORAL_TABLET | ORAL | 3 refills | Status: DC
  Filled 2021-07-28: qty 45, 30d supply, fill #0

## 2021-07-28 MED ORDER — CALCIUM CARBONATE ANTACID 500 MG PO CHEW
3.0000 | CHEWABLE_TABLET | Freq: Two times a day (BID) | ORAL | 3 refills | Status: DC
  Filled 2021-07-28: qty 90, 15d supply, fill #0

## 2021-07-28 MED ORDER — VITAMIN D3 25 MCG PO TABS
1000.0000 [IU] | ORAL_TABLET | Freq: Every day | ORAL | 3 refills | Status: DC
Start: 2021-07-29 — End: 2021-12-25
  Filled 2021-07-28: qty 30, 30d supply, fill #0

## 2021-07-28 MED ORDER — SPIRONOLACTONE 25 MG PO TABS
25.0000 mg | ORAL_TABLET | Freq: Every day | ORAL | Status: DC
  Administered 2021-07-28: 09:00:00 25 mg via ORAL
  Filled 2021-07-28: qty 1

## 2021-07-28 MED ORDER — CALCIUM CITRATE 950 (200 CA) MG PO TABS
200.0000 mg | ORAL_TABLET | Freq: Two times a day (BID) | ORAL | 3 refills | Status: DC
  Filled 2021-07-28: qty 30, 15d supply, fill #0

## 2021-07-28 MED ORDER — PREDNISONE 10 MG PO TABS
ORAL_TABLET | ORAL | 9 refills | Status: DC
  Filled 2021-07-28: qty 90, 60d supply, fill #0

## 2021-07-28 MED ORDER — SULFAMETHOXAZOLE-TRIMETHOPRIM 800-160 MG PO TABS
1.0000 | ORAL_TABLET | ORAL | 5 refills | Status: DC
  Filled 2021-07-28: qty 12, 28d supply, fill #0

## 2021-07-28 MED ORDER — POTASSIUM CHLORIDE CRYS ER 20 MEQ PO TBCR
40.0000 meq | EXTENDED_RELEASE_TABLET | Freq: Once | ORAL | Status: AC
  Administered 2021-07-28: 13:00:00 40 meq via ORAL
  Filled 2021-07-28: qty 4

## 2021-07-28 MED ORDER — METHOTREXATE 2.5 MG PO TABS
ORAL_TABLET | ORAL | 0 refills | Status: DC
  Filled 2021-07-28: qty 32, 30d supply, fill #0

## 2021-07-28 MED ORDER — PANTOPRAZOLE SODIUM 20 MG PO TBEC
20.0000 mg | DELAYED_RELEASE_TABLET | Freq: Every day | ORAL | 3 refills | Status: DC
  Filled 2021-07-28: qty 30, 30d supply, fill #0

## 2021-07-28 NOTE — Progress Notes (Signed)
PICC removed per order for discharge. Patient and family given instructions for post-removal, verbalizes understanding. No complications noted, dressing remains clean/dry/intact.

## 2021-07-28 NOTE — Discharge Summary (Addendum)
Advanced Heart Failure Team  Discharge Summary   Patient ID: Robin Estes MRN: [id number], DOB/AGE: [birth year] 75 y.o. Admit date: 07/17/2021 D/C date:     07/28/2021   Primary Discharge Diagnoses:  VT/VF Arrest>>s/p ICD Implant  Sarcoidosis  Acute Hypoxic Respiratory Failure Requiring Mechanical Ventilation Acute on Chronic Systolic Heart Failure>>>Cardiogenic Shock   AKI due to ATN/ Shock  Moderate Mitral Regurgitation  MAT Hypokalemia/Hypomagnesemia Hypotension   Anemia     Hospital Course:  75 y/o AAF with a h/o biopsy-proven sarcoidosis (eye, lung, and skin), MAT, HTN, MR, pulmonary nodules and systolic heart failure.   In July 2020 she was admitted to [facility] with increased shortness breath. EKG showed A fib/MAT. ECHO  EF 30-35%  and mod-severe MR.GDMT initiated    Due to concern for possible cardiac sarcoid a cMRI was obtained in 8/20   cMRI 04/24/19 1.  Hilar lymphadenopathy consistent with sarcoidosis diagnosis. 2. Mildly dilated LV with EF 45%, there was mild diffuse hypokinesis with severe mid inferior hypokinesis. 3.  Normal RV size and systolic function, EF 51%. 4. Mitral regurgitation appeared severe (flow sequences to quantify were not done). 5. Possible subtle mid-wall LGE in the mid inferior wall (not seen on long axis views). This could be consistent with cardiac sarcoidosis. This is not consistent with prior MI.   TEE 8/21 LVEF 40-45% w/ mild global HK with moderate to severe functional central MR.Marland Kitchen Referred to Dr. Excell Seltzer for consideration of [employer] [facility] but did not keep the appointment d/t gap in insurance coverage.   Seen in [facility] 8/22 and was doing well on GDMT with stable NYHA II symptoms. Sarcoid thought to be quiescent. Repeat echo ordered    On 11/17, pt had an episode of syncope at home around 6 pm. She presented to [facility], was in the triage/waiting room for a few hours where she had an event of cardiac arrest/Vfib (CPR time approx 5 minutes; 1 dose of  epi; she was successfully shocked out of it). Post ROSC she had MAT/Afib on the EKG with no signs of ST elevation, and soft Bps. Levophed was started and Cardiology was consulted. Her initial labs showed K=2.7; and her troponin in the waiting area was 16. Intubated and sedated to protect airway with recurrent arrhythmias. Started on amio and lidocaine. Electrolytes repleted. Placed empirically on cardiac sarcoid protocol w/ steroids/ MTX.   Echo 07/19/75 EF 35-40% Severe MR. 11/19 Head CT negative for acute process. Developed cardiogenic shock on 11/19 with co-ox 42%. Started on DBA and NE. RHC/LHC with no coronary disease. Low filling pressures. High output on DBA + Noepi. CMRI- LVEF 60% RV 60% . LGE basal septal midwall. Nonspecific scar. Recommending cardiac PET.  MDT ICD placed on 11/25.   She had no further VT/VF. She was weaned off of amio and lidocaine gtt to PO amio. Weaned off DBA and NE. Required midodrine for BP support. GDMT limited by soft BP. Unable to tolerate Entresto. Tolerated spiro. Placed back on PO Lasix 20 mg daily.   On 11/28, last seen and examined by Dr. Gala Romney and felt stable for d/c home. Discharged home on cardiac sarcoid treatment protocol w/ prednisone and methotrexate taper. Bactrim for PJP prophylaxis. Will be referred to [facility] for cardiac PET scan. F/u in [facility] arranged in 7 days. EP clinic f/u also arranged.     [facility] Immunosuppressive Therapy for Cardiac Sarcoid  Methotrexate  On Wed 11/30 - Take 10 mg (4 tabs)  On Wed 12/7 and Wed 12/14 -  Take 12.5 mg (5 tabs)  On Wed 12/21 and Wed 12/28 - Take 15 mg (6 tabs)  On Wed 1/4 and Wed 1/11 - Take 17.5 mg (7 tabs)  On 1/18 and every Wednesday there on - take 20 mg (8 tabs)          Prednisone  Now thru 12/21: 30 mg daily  12/22-1/18 : 25 mg daily  1/19-2/15: 20 mg daily  2/16-3/15: 15 mg daily  3/16-3/29: 10 mg per day  3/30-4/12: 7.5 mg daily  4/13-4/26: 5 mg daily  4/27-5/10: 2.5 mg daily  5/11: stop  prednisone         Bactrim  Take 1 tablet by mouth 3 (three) times a week. Stop taking on March 15th (when prednisone dose changes to 10 mg)  Additional Medications  Folic Acid : 1 mg daily  Calcium: 1 tab  BID with meals  Vitamin D: 1 tab daily  Protonix 20 mg daily   ___________________________________________________  Discharge Weight Range: 136 lb  Discharge Vitals: Blood pressure 122/61, pulse 80, temperature 97.6 F (36.4 C), temperature source Oral, resp. rate (!) 28, height 5\' 4"  (1.626 m), weight 61.9 kg, SpO2 97 %.  Labs: Lab Results  Component Value Date   WBC 8.0 07/28/2021   HGB 7.7 (L) 07/28/2021   HCT 24.2 (L) 07/28/2021   MCV 93.1 07/28/2021   PLT 178 07/28/2021    Recent Labs  Lab 07/28/21 0910  NA 136  K 3.3*  CL 104  CO2 25  BUN 10  CREATININE 1.03*  CALCIUM 8.8*  GLUCOSE 151*   Lab Results  Component Value Date   CHOL 163 03/16/2019   HDL 37 (L) 03/16/2019   LDLCALC 112 (H) 03/16/2019   TRIG 69 03/16/2019   BNP (last 3 results) Recent Labs    04/18/21 1340 07/18/21 0230  BNP 124.9* 893.2*    ProBNP (last 3 results) No results for input(s): PROBNP in the last 8760 hours.   Diagnostic Studies/Procedures   No results found.  Discharge Medications   Allergies as of 07/28/2021       Reactions   Cinnamon Other (See Comments)   MOUTH ULCERS   Farxiga [dapagliflozin] Other (See Comments)   Skin infection        Medication List     STOP taking these medications    dapagliflozin propanediol 10 MG Tabs tablet Commonly known as: Farxiga   Entresto 97-103 MG Generic drug: sacubitril-valsartan   metoprolol succinate 50 MG 24 hr tablet Commonly known as: TOPROL-XL       TAKE these medications    amiodarone 200 MG tablet Commonly known as: PACERONE Take 1 tablet (200 mg) two times a day until 12/8 - then take 1 tablet (200 mg) once daily.   atorvastatin 20 MG tablet Commonly known as: LIPITOR TAKE 1 TABLET BY  MOUTH ONCE DAILY AT 6 P.M. What changed: See the new instructions.   calcium carbonate 500 MG chewable tablet Commonly known as: Tums Chew 3 tablets (600 mg of elemental calcium total) by mouth 2 (two) times daily with a meal.   donepezil 5 MG tablet Commonly known as: ARICEPT Take 5 mg by mouth at bedtime.   folic acid 1 MG tablet Commonly known as: FOLVITE Take 1 tablet (1 mg total) by mouth daily. Start taking on: July 29, 2021   furosemide 20 MG tablet Commonly known as: LASIX Take 1 tablet (20 mg total) by mouth daily. Start taking on: July 29, 2021 What changed:  when to take this reasons to take this   guaiFENesin 600 MG 12 hr tablet Commonly known as: MUCINEX Take 600 mg by mouth daily.   ketoconazole 2 % shampoo Commonly known as: NIZORAL Apply 1 application topically 2 (two) times a week.   methotrexate 2.5 MG tablet Commonly known as: RHEUMATREX On Wed 11/30 - Take 10 mg (4 tabs) On Wed 12/7 and Wed 12/14 - Take 12.5 mg (5 tabs) On Wed 12/21 and Wed 12/28 - Take 15 mg (6 tabs) On Wed 1/4 and Wed 1/11 - Take 17.5 mg (7 tabs) On 1/18 and every Wednesday there on - take 20 mg (8 tabs)   midodrine 2.5 MG tablet Commonly known as: PROAMATINE Take 1 tablet (2.5 mg total) by mouth 3 (three) times daily with meals.   multivitamin with minerals tablet Take 1 tablet by mouth daily.   pantoprazole 20 MG tablet Commonly known as: PROTONIX Take 1 tablet (20 mg total) by mouth daily. Start taking on: July 29, 2021   potassium chloride SA 20 MEQ tablet Commonly known as: KLOR-CON TAKE 1 TABLET BY MOUTH ONCE DAILY . APPOINTMENT REQUIRED FOR FUTURE REFILLS What changed: See the new instructions.   predniSONE 10 MG tablet Commonly known as: DELTASONE Now thru 12/21: 30 mg daily  12/22-1/18 : 25 mg daily  1/19-2/15: 20 mg daily  2/16-3/15: 15 mg daily 3/16-3/29: 10 mg per day 3/30-4/12: 7.5 mg daily  4/13-4/26: 5 mg daily 4/27-5/10: 2.5 mg daily   5/11: stop prednisone   spironolactone 25 MG tablet Commonly known as: ALDACTONE Take 1 tablet (25 mg total) by mouth daily.   sulfamethoxazole-trimethoprim 800-160 MG tablet Commonly known as: BACTRIM DS Take 1 tablet by mouth 3 (three) times a week. Stop taking on March 15th (when prednisone dose changes to 10 mg) Start taking on: July 30, 2021   Vitamin D3 25 MCG tablet Commonly known as: Vitamin D Take 1 tablet (1,000 Units total) by mouth daily. Start taking on: July 29, 2021               Durable Medical Equipment  (From admission, onward)           Start     Ordered   07/25/21 1155  For home use only DME 4 wheeled rolling walker with seat  Once       Question:  Patient needs a walker to treat with the following condition  Answer:  Chronic systolic heart failure ([place])   07/25/21 1155   07/22/21 1356  Heart failure home health orders  (Heart failure home health orders / Face to face)  Once       Comments: Heart Failure Follow-up Care:  Verify follow-up appointments per Patient Discharge Instructions. Confirm transportation arranged. Reconcile home medications with discharge medication list. Remove discontinued medications from use. Assist patient/caregiver to manage medications using pill box. Reinforce low sodium food selection Assessments: Vital signs and oxygen saturation at each visit. Assess home environment for safety concerns, caregiver support and availability of low-sodium foods. Consult Education officer, museum, PT/OT, Dietitian, and CNA based on assessments. Perform comprehensive cardiopulmonary assessment. Notify MD for any change in condition or weight gain of 3 pounds in one day or 5 pounds in one week with symptoms. Daily Weights and Symptom Monitoring:   HHRN HHPT  Ensure patient has access to scales. Teach patient/caregiver to weigh daily before breakfast and after voiding using same scale and record.    Teach patient/caregiver to  track weight  and symptoms and when to notify Provider. Activity: Develop individualized activity plan with patient/caregiver.  Question Answer Comment  Heart Failure Follow-up Care Advanced Heart Failure (AHF) Clinic at [phone]   Obtain the following labs Basic Metabolic Panel   Obtain the following labs Other see comments   Lab frequency Other see comments   Fax lab results to AHF Clinic at [phone]   Diet Low Sodium Heart Healthy   Fluid restrictions: 1800 mL Fluid      07/22/21 1356            Disposition   The patient will be discharged in stable condition to home.   Follow-up Information     [facility].   Specialty: Cardiology Why: on 12/8 at 320 pm for post ICD check Contact information: [address] Follow up.   Specialty: Cardiology Why: 08/04/21 at 3:30 PM at the [facility] at [facility], [place] [phone] Contact information: [address] [id number] [place] [phone]                  Duration of Discharge Encounter: Greater than 35 minutes   Signed, Nelida Gores  07/28/2021, 11:56 AM  Patient seen and examined with the above-signed Advanced Practice Provider and/or Housestaff. I personally reviewed laboratory data, imaging studies and relevant notes. I independently examined the patient and formulated the important aspects of the plan. I have edited the note to reflect any of my changes or salient points. I have personally discussed the plan with the patient and/or family.  She is stable for d/c today (once K supped). Will follow closely in HF Clinic.   Glori Bickers, MD  12:11 PM

## 2021-07-28 NOTE — Progress Notes (Addendum)
Patient ID: Robin Estes, female   DOB: June 23, 1946, 75 y.o.   MRN: [id number]    Advanced Heart Failure Rounding Note   Subjective:    Echo 07/18/20 EF 35-40% Severe MR 11/19 Head CT negative for acute process.  11/20 Extubated to 3 liters [place].  11/21 Dobutamine cut back to 5 mg tid and Norep weaning. Started on midodrine.  11/22 Norepi stopped and dobutamine cut back to 2.5 mcg. 11/25 MDT ICD placed.   CMRI- LVEF 60% RV 60% . LGE basal septal midwall. Nonspecific scar. Recommending cardiac PET.    RHC/LHC with no coronary disease. Low filling pressures. High output on DBA + Noepi.   Now off pressors. Co-ox 65%. Back on PO diuretics. Wt down an additional 2 lb. Rhythm stable on amiodarone.   Midodrine restarted yesterday for low BP, now improved but some supine hypertension [id number] systolic. Denies dizziness w/ standing.     Denies dyspnea. Only complaint is mild soreness around device pocket.   Objective:   Weight Range:  Vital Signs:   Temp:  [97.9 F (36.6 C)-98.4 F (36.9 C)] (P) 98.4 F (36.9 C) (11/28 0441) Pulse Rate:  [64-74] (P) 74 (11/28 0441) Resp:  [19-25] (P) 20 (11/28 0441) BP: (98-155)/(62-74) (P) 153/87 (11/28 0441) SpO2:  [95 %-99 %] (P) 95 % (11/28 0441) Weight:  [61.9 kg] 61.9 kg (11/28 0441) Last BM Date: 07/27/21  Weight change: Filed Weights   07/26/21 0609 07/27/21 0227 07/28/21 0441  Weight: 65.2 kg 63 kg 61.9 kg    Intake/Output:   Intake/Output Summary (Last 24 hours) at 07/28/2021 0810 Last data filed at 07/27/2021 2000 Gross per 24 hour  Intake 5.89 ml  Output --  Net 5.89 ml    PHYSICAL EXAM: General:  elderly, chronically ill appearing female No respiratory difficulty HEENT: normal Neck: supple. JVD 8 cm. Carotids 2+ bilat; no bruits. No lymphadenopathy or thyromegaly appreciated. Cor: PMI nondisplaced. Regular rate & rhythm. No rubs, gallops or murmurs. Device pocket left upper chest bandaged, clean dressing. No visible  drainage Lungs: clear Abdomen: soft, nontender, nondistended. No hepatosplenomegaly. No bruits or masses. Good bowel sounds. Extremities: no cyanosis, clubbing, rash, edema. + RUE PICC  Neuro: alert & oriented x 3, cranial nerves grossly intact. moves all 4 extremities w/o difficulty. Affect pleasant.   Telemetry: NSR upper 70s. No further VT/VF personally reviewed.    Labs: Basic Metabolic Panel: Recent Labs  Lab 07/22/21 0256 07/22/21 1645 07/23/21 0520 07/24/21 0500 07/25/21 0455 07/26/21 0340 07/27/21 0530  NA 133*   < > 138 136 136 136 137  K 4.8   < > 3.9 4.1 3.6 4.2 3.5  CL 103  --  104 103 105 108 103  CO2 26  --  29 27 26 24 25   GLUCOSE 143*  --  89 106* 89 85 80  BUN 14  --  11 13 13 10 8   CREATININE 1.11*  --  0.98 1.01* 0.98 1.00 0.98  CALCIUM 7.9*  --  8.0* 8.2* 8.4* 8.1* 8.3*  MG 2.2  --   --  1.9  --   --   --    < > = values in this interval not displayed.    Liver Function Tests: No results for input(s): AST, ALT, ALKPHOS, BILITOT, PROT, ALBUMIN in the last 168 hours. No results for input(s): LIPASE, AMYLASE in the last 168 hours. No results for input(s): AMMONIA in the last 168 hours.  CBC: Recent Labs  Lab 07/21/21 [phone]  07/22/21 1645 07/22/21 1652 07/22/21 1659 07/22/21 1704 07/23/21 0520 07/25/21 0455  WBC 13.2*  --   --   --   --  5.7 6.9  HGB 8.7*   < > 8.8* 7.5* 7.1* 8.4* 7.3*  HCT 27.3*   < > 26.0* 22.0* 21.0* 25.5* 23.4*  MCV 91.6  --   --   --   --  90.7 92.9  PLT PLATELET CLUMPS NOTED ON SMEAR, UNABLE TO ESTIMATE  --   --   --   --  200 199   < > = values in this interval not displayed.    Cardiac Enzymes: No results for input(s): CKTOTAL, CKMB, CKMBINDEX, TROPONINI in the last 168 hours.  BNP: BNP (last 3 results) Recent Labs    04/18/21 1340 07/18/21 0230  BNP 124.9* 893.2*    ProBNP (last 3 results) No results for input(s): PROBNP in the last 8760 hours.    Other results:  Imaging: No results  found.   Medications:     Scheduled Medications:  amiodarone  200 mg Oral BID   calcium citrate  200 mg of elemental calcium Oral BID WC   chlorhexidine  15 mL Mouth Rinse BID   Chlorhexidine Gluconate Cloth  6 each Topical Daily   cholecalciferol  1,000 Units Oral Daily   docusate sodium  100 mg Oral BID   famotidine  20 mg Oral Daily   feeding supplement  1 Container Oral TID BM   folic acid  1 mg Oral Daily   furosemide  20 mg Oral Daily   guaiFENesin  600 mg Oral BID   insulin aspart  0-5 Units Subcutaneous QHS   insulin aspart  0-9 Units Subcutaneous TID WC   mouth rinse  15 mL Mouth Rinse q12n4p   methotrexate  10 mg Oral Weekly   Followed by   Derrill Memo ON 08/06/2021] methotrexate  12.5 mg Oral Weekly   Followed by   Derrill Memo ON 08/20/2021] methotrexate  15 mg Oral Weekly   Followed by   Derrill Memo ON 09/03/2021] methotrexate  17.5 mg Oral Weekly   Followed by   Derrill Memo ON 09/17/2021] methotrexate  20 mg Oral Weekly   midodrine  5 mg Oral TID WC   pantoprazole  20 mg Oral Daily   polyethylene glycol  17 g Oral Daily   predniSONE  30 mg Oral Q breakfast   Followed by   Derrill Memo ON 08/20/2021] predniSONE  25 mg Oral Q breakfast   Followed by   Derrill Memo ON 09/17/2021] predniSONE  20 mg Oral Q breakfast   Followed by   Derrill Memo ON 10/15/2021] predniSONE  15 mg Oral Q breakfast   Followed by   Derrill Memo ON 11/12/2021] predniSONE  10 mg Oral Q breakfast   Followed by   Derrill Memo ON 11/26/2021] predniSONE  7.5 mg Oral Q breakfast   Followed by   Derrill Memo ON 12/10/2021] predniSONE  5 mg Oral Q breakfast   Followed by   Derrill Memo ON 12/24/2021] predniSONE  2.5 mg Oral Q breakfast   sodium chloride flush  10-40 mL Intracatheter Q12H   sodium chloride flush  3 mL Intravenous Q12H   sodium chloride flush  3 mL Intravenous Q12H   sodium chloride flush  3 mL Intravenous Q12H   spironolactone  12.5 mg Oral Daily   sulfamethoxazole-trimethoprim  1 tablet Oral Once per day on Mon Wed Fri    Infusions:   sodium chloride Stopped (07/27/21 1208)    PRN Medications:  sodium chloride, acetaminophen, fentaNYL, midazolam, ondansetron (ZOFRAN) IV, sodium chloride flush, sodium chloride flush   Assessment/Plan:   1. 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 Lido now off - Continue amio 200 mg twice a day.  - Keep K > 4.0 and Mg > 2.0 - Cath without coronary disease. cMRI with LGE possible sarcoid. Need cardiac PET as an outpatient.  - MDT ICD 11/25.  - Continue steroid/MTX protocol.    2. 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 RV normal. Minimal LGE -> doubt significant cardiac sarcoid - Echo 12/20 EF ~45% MV appears thickened and possible rheumatic with 3+ MR. Severe LAE. Mod TR.  - TEE 8/21: LVEF 40-45% w/ mild global HK moderate to severe functional central MR. - Echo 07/18/20 EF 35-40% Severe MR - Developed cardiogenic shock on 11/19 with co-ox 42% - CMRI LV EF 60% RV 60%. LVEF 60% RV 60% . LGE basal septal midwall. Nonspecific scar. Recommending cardiac PET.   - Off inotropes. Co-ox 65%.  - Volume status improved, Euvolemic. Continue Lasix  20 mg daily - Now off Entresto due to hypotension. Continue midodrine for BP support - Continue spiro 12.5 daily.  - Has been unable to tolerate Farxiga   3. Acute hypoxic respiratory failure in setting of #1 - Extubated 07/20/21 to 3 liters. - Stable on RA  4. MAT - Zio in 9/20       1. Sinus rhythm - avg HR of 82      2. 22 Supraventricular Tachycardia runs occurred, the run with the fastest interval lasting 5 beats with a max rate of 184 bpm, the longest lasting 10.9 secs with an avg rate of 162 bpm.     3. Isolated PACs were occasional (4.7%, 18936)     4. Rare PVCs (<1.0%) - currently in NSR  5. Mitral regurgitaion - TEE 8/21 showed moderate to severe functional  central MR. - She had been referred for possible [employer]. Did not keep appointment last fall due to gap in insurance coverage - Severe MR on echo but moderate on cMRI    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 Everardo All in Pulmonary.  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  - Plan as above. Steroids started - MRI -EF 60% normal RV. LGE basal septal midwall. Nonspecific scar. Recommending cardiac PET.   - Cardiac Pet after D/C [facility] Immunosuppressive Therapy for Cardiac Sarcoid  -Start date 11/23 Prednisone 30 mg daily  -Start date 11/23 Methotrexate 10 mg (four 2.5 mg tablets) once weekly Additional Medications  Bactrim DS : 1 tablet Mon-Wed- Fri while taking >15 mg of prednisone daily  Folic Acid : 1 mg daily  Calcium: 1 tab  BID with meals  Vitamin D: 1 tab daily  Protonix 20 mg daily Immunizations- Pneumonia 2016    -Flu ordered        7. Hypokalemia/hypomagnesemia - Keep K > 4.0 Mg > 2.0 - Supp K   8. AKI due to ATN/shock - Resolved, SCr back to baseline <1.0 - f/u BMP pending   9. Hyperglycemia - likely steroid-induced - SSI  10. Hypotension - off Entresto, remains on spiro 12.5 - on midodrine 5 tid, BP improved but supine hypertension - reduce midodrine to 2.5 mg tid at discharge  Plan D/c home today.    Length of Stay: 10   Brittainy Simmons  07/28/2021, 8:10 AM  Advanced Heart Failure Team Pager [phone] (M-F; 7a - 4p)  Please contact [facility] for night-coverage after hours (4p -7a )   Patient seen and examined with the above-signed Advanced Practice Provider and/or Housestaff. I personally reviewed laboratory data, imaging studies and relevant notes. I independently examined the patient and formulated the important aspects of the plan. I have edited the note to reflect any of my changes or salient points. I have personally discussed the plan with the patient and/or family.  Now s/p ICD> Feels ok but BP  labile. Midodrine stopped but now back on. Co-ox ok but hgb appears low.   Denies CP, SOB, orthopnea or PND. Rhythm stable  General:  Sitting up  No resp difficulty HEENT: normal Neck: supple. no JVD. Carotids 2+ bilat; no bruits. No lymphadenopathy or thryomegaly appreciated. Cor: PMI nondisplaced. Regular rate & rhythm. No rubs, gallops or murmurs. ICD site ok  Lungs: clear Abdomen: soft, nontender, nondistended. No hepatosplenomegaly. No bruits or masses. Good bowel sounds. Extremities: no cyanosis, clubbing, rash, edema Neuro: alert & orientedx3, cranial nerves grossly intact. moves all 4 extremities w/o difficulty. Affect pleasant  BP a bit labile. Will continue midodrine at 2.5 tid. No Entresto for now. Continue sarcoid regimen. Hgb has been low. Will recheck today.  If labs ok can possibly d/c later today with close f/u in HF Clinic  Glori Bickers, MD  8:39 AM

## 2021-07-28 NOTE — Care Management Important Message (Signed)
Important Message  Patient Details  Name: Robin Estes MRN: [id number] Date of Birth: [birth year]   Medicare Important Message Given:  Yes     Dorena Bodo 07/28/2021, 2:55 PM

## 2021-07-28 NOTE — Progress Notes (Signed)
Occupational Therapy Treatment Patient Details Name: Robin Estes MRN: [id number] DOB: [birth year] Today's Date: 07/28/2021   History of present illness Pt adm 11/18 with cardiac arrest with V-fib in ED. Pt intubated on 11/18. Extubated 11/20.  Pt had ICD placement on 11/25. PMH - sarcoidosis, HTN, NICM, chf, legally blind rt eye   OT comments  Patient continues to make steady progress towards goals in skilled OT session. Patient's session encompassed  functional ambulation to complete household distances, and ADLs. Patient able to ambulate in hallway without assistive device with good HR and O2 levels. Patient also able to complete toileting task and grooming without assistance. Education provided to patient and husband with regard to energy conservation for a safe discharge home. Discharge remains appropriate; therapy to follow while in house.    Recommendations for follow up therapy are one component of a multi-disciplinary discharge planning process, led by the attending physician.  Recommendations may be updated based on patient status, additional functional criteria and insurance authorization.    Follow Up Recommendations  Home health OT    Assistance Recommended at Discharge Intermittent Supervision/Assistance  Equipment Recommendations  Tub/shower seat    Recommendations for Other Services      Precautions / Restrictions Precautions Precautions: Fall Restrictions Weight Bearing Restrictions: No       Mobility Bed Mobility Overal bed mobility: Modified Independent Bed Mobility: Supine to Sit     Supine to sit: Min guard;HOB elevated     General bed mobility comments: min gaurd solely for safety    Transfers Overall transfer level: Needs assistance   Transfers: Sit to/from Stand Sit to Stand: Supervision;Min guard   Step pivot transfers: Min guard       General transfer comment: Ambulating in hallway without AD     Balance Overall balance assessment:  Needs assistance Sitting-balance support: Feet supported;No upper extremity supported Sitting balance-Leahy Scale: Normal     Standing balance support: No upper extremity supported;During functional activity   Standing balance comment: able to ambulate in hallway without AD                           ADL either performed or assessed with clinical judgement   ADL Overall ADL's : Needs assistance/impaired     Grooming: Wash/dry hands;Wash/dry face;Min guard;Standing           Upper Body Dressing : Sitting;Set up Upper Body Dressing Details (indicate cue type and reason): don back gown for ambulation     Toilet Transfer: Minimal assistance;Stand-pivot;Ambulation;Regular Teacher, adult education Details (indicate cue type and reason): min a for line management Toileting- Clothing Manipulation and Hygiene: Independent;Sit to/from stand         General ADL Comments: patient continues to demonstrate improved activity tolerance with each session    Extremity/Trunk Assessment              Vision       Perception     Praxis      Cognition Arousal/Alertness: Awake/alert Behavior During Therapy: WFL for tasks assessed/performed Overall Cognitive Status: Within Functional Limits for tasks assessed                                            Exercises     Shoulder Instructions       General Comments      Pertinent  Vitals/ Pain       Pain Assessment: No/denies pain  Home Living                                          Prior Functioning/Environment              Frequency  Min 2X/week        Progress Toward Goals  OT Goals(current goals can now be found in the care plan section)  Progress towards OT goals: Progressing toward goals  Acute Rehab OT Goals Patient Stated Goal: to go home OT Goal Formulation: With patient Time For Goal Achievement: 08/06/21 Potential to Achieve Goals: Good  Plan  Discharge plan remains appropriate    Co-evaluation                 AM-PAC OT "6 Clicks" Daily Activity     Outcome Measure   Help from another person eating meals?: None Help from another person taking care of personal grooming?: None Help from another person toileting, which includes using toliet, bedpan, or urinal?: None Help from another person bathing (including washing, rinsing, drying)?: A Little Help from another person to put on and taking off regular upper body clothing?: None Help from another person to put on and taking off regular lower body clothing?: A Little 6 Click Score: 22    End of Session    OT Visit Diagnosis: Unsteadiness on feet (R26.81);Muscle weakness (generalized) (M62.81);Other symptoms and signs involving cognitive function   Activity Tolerance Patient tolerated treatment well   Patient Left in chair;with call bell/phone within reach;with family/visitor present   Nurse Communication Mobility status        Time: [id number] OT Time Calculation (min): 20 min  Charges: OT General Charges $OT Visit: 1 Visit OT Treatments $Self Care/Home Management : 8-22 mins  Corinne Ports E. Henley Blyth, COTA/L Acute Rehabilitation Services [phone] [place] 07/28/2021, 11:24 AM

## 2021-07-28 NOTE — Plan of Care (Signed)

## 2021-07-30 ENCOUNTER — Telehealth (HOSPITAL_COMMUNITY): Payer: Self-pay | Admitting: *Deleted

## 2021-07-30 NOTE — Telephone Encounter (Signed)
Heather w.[facility] called for v.o   V.o given cll back # [phone]

## 2021-07-31 ENCOUNTER — Other Ambulatory Visit (HOSPITAL_COMMUNITY): Payer: Self-pay

## 2021-08-01 ENCOUNTER — Telehealth (HOSPITAL_COMMUNITY): Payer: Self-pay

## 2021-08-01 NOTE — Telephone Encounter (Signed)
Called to confirm/remind patient of their appointment at [facility] on 08/04/21.   Patient reminded to bring all medications and/or complete list.  Confirmed patient has transportation. Gave directions, instructed to utilize valet parking.  Confirmed appointment prior to ending call.

## 2021-08-03 NOTE — Progress Notes (Signed)
ADVANCED HF CLINIC NOTE  Primary Care: Nicholes Rough, PA-C Primary Cardiologist: Dr Johnsie Cancel  HF Cardiologist: Dr. Haroldine Laws  Reason for visit: Follow-up for chronic systolic heart failure, mitral regurgitation  HPI: Ms Flournoy is a ,75 y.o. woman with a past medical history of interstitial cystitis, sarcoidosis (eye, lung, and skin), osteopenia, MAT, HTN, MR, pulmonary nodules on CT 0000000, and systolic heart failure.  In July 2020 she was admitted to [facility] with increased shortness breath. EKG showed A fib/MAT. ECHO  EF 30-35%  and mod-severe MR.GDMT initiated   TEE 8/21 LVEF 40-45% w/ mild global HK with moderate to severe functional central MR.Marland Kitchen Referred to Dr. Burt Knack for consideration of [facility] but did not keep the appointment d/t gap in insurance coverage.  Seen in [facility] 8/22 and was doing well on GDMT with stable NYHA II symptoms. Sarcoid thought to be quiescent. Repeat echo ordered   She presented to [facility] 07/17/21 after syncopal episode. Unfortunately had cardiac arrest/Vfib (CPR time approx 5 minutes; 1 dose of epi; she was successfully shocked out of it). Post ROSC she had MAT/Afib on the EKG with no signs of ST elevation, and soft BPs. Levophed started and Cardiology consulted. She was intubated and sedated to protect airway with recurrent arrhythmias. Started on amio and lidocaine. Electrolytes repleted. Placed empirically on cardiac sarcoid protocol w/ steroids/ MTX. Echo 07/18/21 EF 35-40% Severe MR. She developed cardiogenic shock and was started on DBA and NE. RHC/LHC with no coronary disease, low filling pressures. High output on DBA + Noepi. CMRI- LVEF 60% RV 60%. LGE basal septal midwall. Nonspecific scar. Needs cardiac PET.  She had MDT ICD placed. She was able to wean off amio, lidocaine, DBA and NE gtt. GDMT limited by soft BP, unable to tolerate Entresto. Discharged home on cardiac sarcoid treatment protocol w/ prednisone and methotrexate taper. Bactrim for PJP  prophylaxis. Weight 136 lbs.  Today she returns for HF follow up with her daughter. Overall feeling fine since discharge. Has some incision soreness at ICD site. She lives at home with her husband and is able to get around the house OK, has a walker she uses for balance. She is not very active but no SOB with ADLs. Denies abnormal bleeding, palpitations, CP, dizziness, edema, or PND/Orthopnea. Appetite ok. No fever or chills. Weights at home 129-132 lbs. Taking all medications.   Cardiac studies - R/LHC (11/22): On NE 3 and DBA 5   Ao =  118/41 (67) LV =  121/9 RA =  2 RV = 52/4 PA = 56/6 (24) PCW = 7 Fick cardiac output/index = 11.2/6.6 PVR = 1.5 WU SVR = 463 FA sat = 99% PA sat = 82%, 82% SVC sat = 64%   Assessment: 1. Normal coronary arteries 2. EF 45% by v-gram 3. High cardiac output with low SVR on DBA 5 and NE 3. No evidence of intracardiac shunting.   - Echo (11/22): EF 35-40% Severe MR  - CMRI (11/22): LV EF 60% RV 60%. LVEF 60% RV 60% . LGE basal septal midwall. Nonspecific scar.   - Echo (12/20): EF ~45% MV appears thickened and possible rheumatic with 3+ MR. Severe LAE. Mod TR. Personally reviewed  - Echo (7/20):  30-35% , RV normal, MV mod-severe MR.   - Myoview (7/20): no ischemia, moderate LV dysfunction 30%   - TEE (8/21): moderate to severe functional central MR. LVEF 40-45% w/ mild global HK.  - cMRI 04/24/19:  1.  Hilar lymphadenopathy  consistent with sarcoidosis diagnosis.   2. Mildly dilated LV with EF 45%, there was mild diffuse hypokinesis with severe mid inferior hypokinesis.   3.  Normal RV size and systolic function, EF [id number].   4. Mitral regurgitation appeared severe (flow sequences to quantify were not done).   5. Possible subtle mid-wall LGE in the mid inferior wall (not seen on long axis views). This could be consistent with cardiac sarcoidosis. This is not consistent with prior MI.  Review of systems complete and found to be negative  unless listed in HPI.  Past Medical History:  Diagnosis Date   Bladder spasms    Cardiomegaly 03/15/2019   CHF (congestive heart failure) (HCC)    Chronic interstitial cystitis    Combined systolic and diastolic heart failure (HCC)    Dysuria    Elevated troponin 03/15/2019   End-stage glaucoma    RIGHT EYE   Frequency of urination    Legally blind in right eye, as defined in [place]    SECONDARY TO GLAUCOMA   Lymphadenopathy, hilar 03/15/2019   Multifocal atrial tachycardia (HCC)    Nocturia    Pleural effusion 03/15/2019   Prolonged QT interval 03/15/2019   Pulmonary sarcoidosis ([place]) 07/09/2019   Sarcoidosis 03/15/2019   Sarcoidosis of skin    Sensation of pressure in bladder area    Solitary pulmonary nodule 07/09/2019   Urgency of urination     Current Outpatient Medications  Medication Sig Dispense Refill   amiodarone (PACERONE) 200 MG tablet Take 1 tablet (200 mg) two times a day until 12/8 - then take 1 tablet (200 mg) once daily. 45 tablet 3   atorvastatin (LIPITOR) 20 MG tablet TAKE 1 TABLET BY MOUTH ONCE DAILY AT 6 P.M. 30 tablet 8   calcium carbonate (TUMS) 500 MG chewable tablet Chew 3 tablets (600 mg of elemental calcium total) by mouth 2 (two) times daily with a meal. 90 tablet 3   donepezil (ARICEPT) 5 MG tablet Take 5 mg by mouth at bedtime.     folic acid (FOLVITE) 1 MG tablet Take 1 tablet (1 mg total) by mouth daily. 30 tablet 3   furosemide (LASIX) 20 MG tablet Take 1 tablet (20 mg total) by mouth daily. 30 tablet 3   guaiFENesin (MUCINEX) 600 MG 12 hr tablet Take 600 mg by mouth daily.     methotrexate (RHEUMATREX) 2.5 MG tablet On Wed 11/30 - Take 10 mg (4 tabs) On Wed 12/7 and Wed 12/14 - Take 12.5 mg (5 tabs) On Wed 12/21 and Wed 12/28 - Take 15 mg (6 tabs) On Wed 1/4 and Wed 1/11 - Take 17.5 mg (7 tabs) On 1/18 and every Wednesday there on - take 20 mg (8 tabs) 32 tablet 0   Multiple Vitamins-Minerals (MULTIVITAMIN WITH MINERALS) tablet Take 1 tablet by mouth  daily.     pantoprazole (PROTONIX) 20 MG tablet Take 1 tablet (20 mg total) by mouth daily. 30 tablet 3   potassium chloride SA (KLOR-CON) 20 MEQ tablet TAKE 1 TABLET BY MOUTH ONCE DAILY . APPOINTMENT REQUIRED FOR FUTURE REFILLS 30 tablet 6   predniSONE (DELTASONE) 10 MG tablet Now thru 12/21: 30 mg daily,  12/22-1/18 : 25 mg daily   1/19-2/15: 20 mg daily  2/16-3/15: 15 mg daily 3/16-3/29: 10 mg per day 3/30-4/12: 7.5 mg daily  4/13-4/26: 5 mg daily 4/27-5/10: 2.5 mg daily  5/11: stop prednisone 90 tablet 9   spironolactone (ALDACTONE) 25 MG tablet Take 1 tablet (25 mg  total) by mouth daily. 90 tablet 3   sulfamethoxazole-trimethoprim (BACTRIM DS) 800-160 MG tablet Take 1 tablet by mouth 3 (three) times a week. Stop taking on March 15th (when prednisone dose changes to 10 mg) 12 tablet 5   Vitamin D3 (VITAMIN D) 25 MCG tablet Take 1 tablet (1,000 Units total) by mouth daily. 30 tablet 3   No current facility-administered medications for this encounter.   Allergies  Allergen Reactions   Cinnamon Other (See Comments)    MOUTH ULCERS   Farxiga [Dapagliflozin] Other (See Comments)    Skin infection   Social History   Socioeconomic History   Marital status: Married    Spouse name: Not on file   Number of children: Not on file   Years of education: Not on file   Highest education level: Not on file  Occupational History   Not on file  Tobacco Use   Smoking status: Never   Smokeless tobacco: Never  Substance and Sexual Activity   Alcohol use: No   Drug use: No   Sexual activity: Not on file  Other Topics Concern   Not on file  Social History Narrative   Not on file   Social Determinants of Health   Financial Resource Strain: High Risk   Difficulty of Paying Living Expenses: Hard  Food Insecurity: No Food Insecurity   Worried About Running Out of Food in the Last Year: Never true   Ran Out of Food in the Last Year: Never true  Transportation Needs: No Transportation  Needs   Lack of Transportation (Medical): No   Lack of Transportation (Non-Medical): No  Physical Activity: Not on file  Stress: Not on file  Social Connections: Not on file  Intimate Partner Violence: Not on file   Family History  Problem Relation Age of Onset   CAD Father    Heart attack Father    Sarcoidosis Other    Healthy Sister    Hyperlipidemia Sister    Healthy Sister    BP 120/62   Pulse 60   Wt 59.1 kg (130 lb 3.2 oz)   SpO2 100%   BMI 22.35 kg/m   General:  NAD. No resp difficulty, thin, arrived in [facility] HEENT: opaque right eye/blind Neck: Supple. No JVD. Carotids 2+ bilat; no bruits. No lymphadenopathy or thryomegaly appreciated. Cor: PMI nondisplaced. Regular rate & rhythm. No rubs, gallops or murmurs. Lungs: Clear, ICD site ok Abdomen: Soft, nontender, nondistended. No hepatosplenomegaly. No bruits or masses. Good bowel sounds. Extremities: No cyanosis, clubbing, rash, edema Neuro: Alert & oriented x 3, cranial nerves grossly intact. Moves all 4 extremities w/o difficulty. Affect pleasant.  ECG: a paced 60 bpm (personally reviewed)  Device interrogation: limited data but thoracic impedence appears stable, no VT  ASSESSMENT & PLAN: 1. 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 Decrease to 200 mg daily on 08/07/21 per discharge instructions. - Cath without coronary disease. cMRI with LGE possible sarcoid. Need cardiac PET.  - s/p MDT ICD.  - Continue steroid/MTX protocol.  - BMET and Mag today.   2. Chronic Systolic Heart Failure - cMRI 8/20 with EF 45%. RV normal. Minimal LGE -> doubt significant cardiac sarcoid - Echo 12/20 EF ~45% MV appears thickened and possible rheumatic with 3+ MR. Severe LAE. Mod TR.  - TEE 8/21: LVEF 40-45% w/ mild global HK moderate to severe functional central MR. -  Echo 11/22 EF 35-40% Severe MR - cMRI LV EF 60% RV  60%. LVEF 60% RV 60% . LGE basal septal midwall. Nonspecific scar. Recommending cardiac PET.   - Stable NYHA II, volume looks good today. GDMT limited by low BP. - Stop midodrine today. - Add Entresto 24/26 and change Lasix to PRN next appt. - Continue Lasix 20 mg daily. - Continue spiro 25 mg daily.  - Unable to tolerate [name]. - Arrange for Cardiac PET at [facility], discussed with Dr. Gala Romney. - Labs today.   3. MAT - Zio in 9/20 mostly SR, some PACs (4.7%), rare PVCs (<1.0%), 22 runs of SVT  - No AT on device interrogation today. - A-paced on ECG today.   4. 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 - She had been referred for possible [employer]. Did not keep appointment last fall due to gap in insurance coverage - Severe MR on echo, but moderate on cMRI     5. 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 Everardo All in Pulmonary.  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  - cMRI EF 60% normal RV. LGE basal septal midwall. Nonspecific scar.   [facility] Immunosuppressive Therapy for Cardiac Sarcoid  - Continue prednisone 30 mg daily. Decrease to 25 mg 12/21-1/18. - Continue methotrexate 10 mg (four 2.5 mg tablets) once weekly. On Wed 12/7 & 12/14, take 12.5 mg daily, increase to 15 mg on Wed 12/21 and 12/28 - Continue Bactrim DS 1 tablet Mon-Wed- Fri while taking >15 mg of prednisone daily  - Continue Folic Acid 1 mg daily  - Continue Calcium 1 tab bid with meals.  - Continue Vitamin D 1 tab daily.  - Continue Protonix 20 mg daily. - Arrange for Cardiac PET at [facility].  6. Hypotension - Resolving. 120's today. - Stop midodrine as above. Check BP at  home if symptomatic with stopping med.   Follow up in 4 weeks with PharmD for medication titration (check CMET, CBC w/ diff and add Entresto 24/26 if able), APP in 6 weeks, and Dr. Gala Romney in 12 weeks.  Prince Rome, FNP-BC 08/04/21

## 2021-08-04 ENCOUNTER — Other Ambulatory Visit: Payer: Self-pay

## 2021-08-04 ENCOUNTER — Ambulatory Visit (HOSPITAL_COMMUNITY)
Admission: RE | Admit: 2021-08-04 | Discharge: 2021-08-04 | Disposition: A | Discharge status: Home / Self Care | Payer: Self-pay | Source: Ambulatory Visit | Attending: Family Medicine | Admitting: Family Medicine

## 2021-08-04 ENCOUNTER — Encounter (HOSPITAL_COMMUNITY): Payer: Self-pay

## 2021-08-04 VITALS — BP 120/62 | HR 60 | Wt 130.2 lb

## 2021-08-04 DIAGNOSIS — I110 Hypertensive heart disease with heart failure: Secondary | ICD-10-CM | POA: Insufficient documentation

## 2021-08-04 DIAGNOSIS — D869 Sarcoidosis, unspecified: Secondary | ICD-10-CM | POA: Insufficient documentation

## 2021-08-04 DIAGNOSIS — Z79899 Other long term (current) drug therapy: Secondary | ICD-10-CM | POA: Insufficient documentation

## 2021-08-04 DIAGNOSIS — I471 Supraventricular tachycardia: Secondary | ICD-10-CM

## 2021-08-04 DIAGNOSIS — I959 Hypotension, unspecified: Secondary | ICD-10-CM | POA: Insufficient documentation

## 2021-08-04 DIAGNOSIS — I5042 Chronic combined systolic (congestive) and diastolic (congestive) heart failure: Secondary | ICD-10-CM | POA: Insufficient documentation

## 2021-08-04 DIAGNOSIS — I4891 Unspecified atrial fibrillation: Secondary | ICD-10-CM | POA: Insufficient documentation

## 2021-08-04 DIAGNOSIS — Z95810 Presence of automatic (implantable) cardiac defibrillator: Secondary | ICD-10-CM | POA: Insufficient documentation

## 2021-08-04 DIAGNOSIS — Z8674 Personal history of sudden cardiac arrest: Secondary | ICD-10-CM | POA: Insufficient documentation

## 2021-08-04 DIAGNOSIS — I340 Nonrheumatic mitral (valve) insufficiency: Secondary | ICD-10-CM | POA: Insufficient documentation

## 2021-08-04 DIAGNOSIS — I472 Ventricular tachycardia, unspecified: Secondary | ICD-10-CM

## 2021-08-04 LAB — BASIC METABOLIC PANEL
Anion gap: 8 (ref 5–15)
BUN: 24 mg/dL — ABNORMAL HIGH (ref 8–23)
CO2: 24 mmol/L (ref 22–32)
Calcium: 9.3 mg/dL (ref 8.9–10.3)
Chloride: 105 mmol/L (ref 98–111)
Creatinine, Ser: 1.09 mg/dL — ABNORMAL HIGH (ref 0.44–1.00)
GFR, Estimated: 53 mL/min — ABNORMAL LOW (ref >60)
Glucose, Bld: 156 mg/dL — ABNORMAL HIGH (ref 70–99)
Potassium: 4.3 mmol/L (ref 3.5–5.1)
Sodium: 137 mmol/L (ref 135–145)

## 2021-08-04 LAB — CBC
HCT: 30 % — ABNORMAL LOW (ref 36.0–46.0)
Hemoglobin: 9.3 g/dL — ABNORMAL LOW (ref 12.0–15.0)
MCH: 29.7 pg (ref 26.0–34.0)
MCHC: 31 g/dL (ref 30.0–36.0)
MCV: 95.8 fL (ref 80.0–100.0)
Platelets: 290 10*3/uL (ref 150–400)
RBC: 3.13 MIL/uL — ABNORMAL LOW (ref 3.87–5.11)
RDW: 17.8 % — ABNORMAL HIGH (ref 11.5–15.5)
WBC: 12.1 10*3/uL — ABNORMAL HIGH (ref 4.0–10.5)
nRBC: 0 % (ref 0.0–0.2)

## 2021-08-04 NOTE — Patient Instructions (Addendum)
Thank You for coming in today  [employer] [phone]  EKG was done  Labs were done, if any labs are abnormal the clinic will call you  STOP Midodrine  Your provider has recommended you have a Cardiac PET Scan at [facility]. We will get this approved with your insurance company and get it scheduled for you. We will call you with the date and time and instructions. [facility] will call you to review this information the day before the test.   Your physician recommends that you schedule a follow-up appointment in: 3 weeks with pharmacy.Marland Kitchenthen 6 weeks and 12 weeks with Dr. Gala Romney  At the [facility], you and your health needs are our priority. As part of our continuing mission to provide you with exceptional heart care, we have created designated Provider Care Teams. These Care Teams include your primary Cardiologist (physician) and Advanced Practice Providers (APPs- Physician Assistants and Nurse Practitioners) who all work together to provide you with the care you need, when you need it.   You may see any of the following providers on your designated Care Team at your next follow up: Dr Arvilla Meres Dr Carron Curie, NP Robbie Lis, [place] [facility] [place], [place] Karle Plumber, PharmD   Please be sure to bring in all your medications bottles to every appointment.    If you have any questions or concerns before your next appointment please send [place] a message through [place] or call our office at [phone].    TO LEAVE A MESSAGE FOR THE NURSE SELECT OPTION 2, PLEASE LEAVE A MESSAGE INCLUDING: YOUR NAME DATE OF BIRTH CALL BACK NUMBER REASON FOR CALL**this is important as we prioritize the call backs  YOU WILL RECEIVE A CALL BACK THE SAME DAY AS LONG AS YOU CALL BEFORE 4:00 PM

## 2021-08-07 ENCOUNTER — Ambulatory Visit (INDEPENDENT_AMBULATORY_CARE_PROVIDER_SITE_OTHER): Payer: Self-pay

## 2021-08-07 ENCOUNTER — Other Ambulatory Visit: Payer: Self-pay

## 2021-08-07 DIAGNOSIS — I469 Cardiac arrest, cause unspecified: Secondary | ICD-10-CM

## 2021-08-07 DIAGNOSIS — I4901 Ventricular fibrillation: Secondary | ICD-10-CM

## 2021-08-07 LAB — CUP PACEART INCLINIC DEVICE CHECK
Battery Remaining Longevity: 120 mo
Battery Voltage: 3.01 V
Brady Statistic AP VP Percent: 0.32 %
Brady Statistic AP VS Percent: 47.49 %
Brady Statistic AS VP Percent: 0.02 %
Brady Statistic AS VS Percent: 52.18 %
Brady Statistic RA Percent Paced: 47.74 %
Brady Statistic RV Percent Paced: 0.33 %
Date Time Interrogation Session: 20221208165237
HighPow Impedance: 56 Ohm
Implantable Lead Implant Date: 20221125
Implantable Lead Implant Date: 20221125
Implantable Lead Location: 753859
Implantable Lead Location: 753860
Implantable Lead Model: 5076
Implantable Lead Model: 6935
Implantable Pulse Generator Implant Date: 20221125
Lead Channel Impedance Value: 380 Ohm
Lead Channel Impedance Value: 437 Ohm
Lead Channel Impedance Value: 456 Ohm
Lead Channel Pacing Threshold Amplitude: 0.625 V
Lead Channel Pacing Threshold Amplitude: 0.625 V
Lead Channel Pacing Threshold Pulse Width: 0.4 ms
Lead Channel Pacing Threshold Pulse Width: 0.4 ms
Lead Channel Sensing Intrinsic Amplitude: 3.5 mV
Lead Channel Sensing Intrinsic Amplitude: 6.5 mV
Lead Channel Setting Pacing Amplitude: 3.5 V
Lead Channel Setting Pacing Amplitude: 3.5 V
Lead Channel Setting Pacing Pulse Width: 0.4 ms
Lead Channel Setting Sensing Sensitivity: 0.3 mV

## 2021-08-07 NOTE — Progress Notes (Signed)

## 2021-08-07 NOTE — Patient Instructions (Signed)

## 2021-08-11 NOTE — Progress Notes (Incomplete)
***In Progress*** Primary Care: Ladora Daniel, PA-C Primary Cardiologist: Dr Eden Emms  HF Cardiologist: Dr. Gala Romney  HPI:  Ms Unterreiner is a 75 y.o. woman with a past medical history of interstitial cystitis, sarcoidosis (eye, lung, and skin), osteopenia, MAT, HTN, MR, pulmonary nodules on CT 03/2019, and systolic heart failure.   In July 2020 she was admitted to [facility] with increased shortness breath. EKG showed A fib/MAT. ECHO  EF 30-35%  and mod-severe MR. GDMT initiated.    TEE 03/2020 LVEF 40-45% w/ mild global HK with moderate to severe functional central MR. Referred to Dr. Excell Seltzer for consideration of [facility] but did not keep the appointment d/t gap in insurance coverage.   Seen in [facility] 03/2021 and was doing well on GDMT with stable NYHA II symptoms. Sarcoid thought to be quiescent. Repeat echo ordered.    She presented to [facility] 07/17/21 after syncopal episode. Unfortunately had cardiac arrest/Vfib (CPR time approx 5 minutes; 1 dose of epi; she was successfully shocked out of it). Post ROSC she had MAT/Afib on the EKG with no signs of ST elevation, and soft BPs. Levophed started and Cardiology consulted. She was intubated and sedated to protect airway with recurrent arrhythmias. Started on amio and lidocaine. Electrolytes repleted. Placed empirically on cardiac sarcoid protocol w/ steroids/ MTX. Echo 07/18/21 EF 35-40% Severe MR. She developed cardiogenic shock and was started on DBA and NE. RHC/LHC with no coronary disease, low filling pressures. High output on DBA + NE. CMRI- LVEF 60% RV 60%. LGE basal septal midwall. Nonspecific scar. Needs cardiac PET.  She had MDT ICD placed. She was able to wean off amio, lidocaine, DBA and NE gtt. GDMT limited by soft BP, unable to tolerate Entresto. Discharged home on cardiac sarcoid treatment protocol w/ prednisone and methotrexate taper. Bactrim for PJP prophylaxis. Weight 136 lbs.   Recently returned to A[facility] Clinic on 08/04/21 for HF follow up with her  daughter. Overall had been feeling fine since discharge. Had some soreness at the ICD incision site. She lives at home with her husband and reported that she was able to get around the house OK, has a walker she uses for balance. She was not very active but no SOB with ADLs. Denied abnormal bleeding, palpitations, CP, dizziness, edema, or PND/Orthopnea. Appetite was ok. No fever or chills. Weights at home was 129-132 lbs. Reported taking all medications.     Today she returns to HF clinic for pharmacist medication titration. At last visit with APP, midodrine was discontinued.   Shortness of breath/dyspnea on exertion? {YES NO:22349}  Orthopnea/PND? {YES NO:22349} Edema? {YES NO:22349} Lightheadedness/dizziness? {YES NO:22349} Daily weights at home? {YES NO:22349} Blood pressure/heart rate monitoring at home? {YES [id number] Following low-sodium/fluid-restricted diet? {YES NO:22349}  HF Medications: Spironolactone 25 mg daily Lasix 20 mg daily  Has the patient been experiencing any side effects to the medications prescribed?  {YES NO:22349}  Does the patient have any problems obtaining medications due to transportation or finances?   {YES NO:22349}  Understanding of regimen: {excellent/good/fair/poor:19665} Understanding of indications: {excellent/good/fair/poor:19665} Potential of compliance: {excellent/good/fair/poor:19665} Patient understands to avoid NSAIDs. Patient understands to avoid decongestants.    Pertinent Lab Values: Serum creatinine ***, BUN ***, Potassium ***, Sodium ***, BNP ***, Magnesium ***, Digoxin ***   Vital Signs: Weight: *** (last clinic weight: ***) Blood pressure: ***  Heart rate: ***   Assessment/Plan: 1. 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 cMRI with LGE possible sarcoid. Need cardiac PET.  -  s/p MDT ICD.  - Continue steroid/MTX protocol.    2. Chronic Systolic Heart Failure - cMRI 03/1016 with EF 45%. RV normal. Minimal LGE -> doubt significant cardiac sarcoid - Echo 12/20 EF ~45% MV appears thickened and possible rheumatic with 3+ MR. Severe LAE. Mod TR.  - TEE 03/2020: LVEF 40-45% w/ mild global HK moderate to severe functional central MR. - Echo 07/2021 EF 35-40% Severe MR - cMRI LV EF 60% RV 60%. LVEF 60% RV 60% . LGE basal septal midwall. Nonspecific scar. Recommending cardiac PET.   - Stable NYHA II, euvolemic on exam. GDMT limited by low BP. - Continue Lasix 20 mg daily. - Continue spironolactone 25 mg daily.  - Unable to tolerate [name]. - Arrange for Cardiac PET at [facility], discussed with Dr. Gala Romney.   3. MAT - Zio in 05/2019 mostly SR, some PACs (4.7%), rare PVCs (<1.0%), 22 runs of SVT  - No AT on device interrogation today. - A-paced on ECG today.   4. Mitral regurgitaion - TEE 03/2020 showed moderate to severe functional central MR. - She had been referred for possible [employer]. Did not keep appointment last fall due to gap in insurance coverage - Severe MR on echo, but moderate on cMRI     5. 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 Everardo All in Pulmonary.  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  - cMRI EF 60% normal RV. LGE basal septal midwall. Nonspecific scar.   [facility] Immunosuppressive Therapy for Cardiac Sarcoid *** - Continue prednisone 30 mg daily. Decrease to 25 mg 12/21-1/18. - Continue methotrexate 10 mg (four 2.5 mg tablets) once weekly. On Wed 12/7 & 12/14, take 12.5 mg daily, increase to 15 mg on Wed 12/21 and 12/28 - Continue Bactrim DS 1 tablet Mon-Wed- Fri while taking >15 mg of prednisone daily  - Continue Folic Acid 1 mg daily  - Continue Calcium 1 tab BID with meals.  - Continue Vitamin D 1 tab daily.  - Continue Protonix 20 mg daily. - Arrange for Cardiac PET at [facility].   6. Hypotension - Resolving.  120's today. - Stop midodrine as above. Check BP at  home if symptomatic with stopping med.***  Follow up ***   Karle Plumber, PharmD, BCPS, BCCP, CPP Heart Failure Clinic Pharmacist [phone]

## 2021-08-18 ENCOUNTER — Ambulatory Visit (HOSPITAL_COMMUNITY): Admission: RE | Admit: 2021-08-18 | Payer: Self-pay | Source: Ambulatory Visit

## 2021-08-18 ENCOUNTER — Encounter (HOSPITAL_COMMUNITY): Payer: Self-pay | Admitting: Radiology

## 2021-08-18 ENCOUNTER — Encounter (HOSPITAL_COMMUNITY): Payer: Self-pay

## 2021-08-19 ENCOUNTER — Other Ambulatory Visit (HOSPITAL_COMMUNITY): Payer: Self-pay

## 2021-08-26 ENCOUNTER — Other Ambulatory Visit (HOSPITAL_COMMUNITY): Payer: Self-pay

## 2021-08-26 ENCOUNTER — Inpatient Hospital Stay (HOSPITAL_COMMUNITY): Admission: RE | Admit: 2021-08-26 | Payer: Self-pay | Source: Ambulatory Visit

## 2021-09-01 ENCOUNTER — Other Ambulatory Visit (HOSPITAL_COMMUNITY): Payer: Self-pay

## 2021-09-02 ENCOUNTER — Other Ambulatory Visit (HOSPITAL_COMMUNITY): Payer: Self-pay | Admitting: Internal Medicine

## 2021-09-03 NOTE — Telephone Encounter (Signed)
This is a CHF pt, Dr. Bensimhon pt 

## 2021-09-15 ENCOUNTER — Ambulatory Visit (HOSPITAL_COMMUNITY)
Admission: RE | Admit: 2021-09-15 | Discharge: 2021-09-15 | Disposition: A | Discharge status: Home / Self Care | Payer: Self-pay | Source: Ambulatory Visit | Attending: Internal Medicine | Admitting: Internal Medicine

## 2021-09-15 ENCOUNTER — Other Ambulatory Visit: Payer: Self-pay

## 2021-09-15 ENCOUNTER — Telehealth (HOSPITAL_COMMUNITY): Payer: Self-pay

## 2021-09-15 DIAGNOSIS — I700 Atherosclerosis of aorta: Secondary | ICD-10-CM | POA: Insufficient documentation

## 2021-09-15 DIAGNOSIS — I080 Rheumatic disorders of both mitral and aortic valves: Secondary | ICD-10-CM | POA: Insufficient documentation

## 2021-09-15 DIAGNOSIS — I340 Nonrheumatic mitral (valve) insufficiency: Secondary | ICD-10-CM

## 2021-09-15 DIAGNOSIS — I5042 Chronic combined systolic (congestive) and diastolic (congestive) heart failure: Secondary | ICD-10-CM | POA: Insufficient documentation

## 2021-09-15 LAB — ECHOCARDIOGRAM COMPLETE
Area-P 1/2: 2.87 cm2
Calc EF: 54.2 %
MV M vel: 5.35 m/s
MV Peak grad: 114.5 mmHg
Radius: 0.4 cm
S' Lateral: 2.7 cm
Single Plane A2C EF: 57.6 %
Single Plane A4C EF: 50.3 %

## 2021-09-15 NOTE — Progress Notes (Signed)
°  Echocardiogram 2D Echocardiogram has been performed.  Robin Estes 09/15/2021, 12:06 PM

## 2021-09-15 NOTE — Telephone Encounter (Signed)
Called to confirm/remind patient of their appointment at the [facility] on 09/16/21.  ° °Patient reminded to bring all medications and/or complete list. ° °Confirmed patient has transportation. Gave directions, instructed to utilize valet parking. ° °Confirmed appointment prior to ending call.  ° °

## 2021-09-15 NOTE — Progress Notes (Signed)
ADVANCED HF CLINIC NOTE  Primary Care: Nicholes Rough, PA-C Primary Cardiologist: Dr Johnsie Cancel  HF Cardiologist: Dr. Haroldine Laws  Reason for visit: Follow-up for chronic systolic heart failure  HPI: Ms Hakeem is a ,76 y.o. woman with a past medical history of interstitial cystitis, sarcoidosis (eye, lung, and skin), osteopenia, MAT, HTN, MR, pulmonary nodules on CT 05/1504, and systolic heart failure.  In July 2020 she was admitted to [facility] with increased shortness breath. EKG showed A fib/MAT. ECHO  EF 30-35%  and mod-severe MR.GDMT initiated   TEE 8/21 LVEF 40-45% w/ mild global HK with moderate to severe functional central MR.Marland Kitchen Referred to Dr. Burt Knack for consideration of [facility] but did not keep the appointment d/t gap in insurance coverage.  Seen in [facility] 8/22 and was doing well on GDMT with stable NYHA II symptoms. Sarcoid thought to be quiescent. Repeat echo ordered   She presented to [facility] 07/17/21 after syncopal episode. Unfortunately had cardiac arrest/Vfib (CPR time approx 5 minutes; 1 dose of epi; she was successfully shocked out of it). Post ROSC she had MAT/Afib ,no signs of ST elevation, and soft BPs. NE gtt started, she was intubated and Cardiology consulted. Started on amio and lidocaine. Electrolytes repleted. Placed empirically on cardiac sarcoid protocol w/ steroids/ MTX. Echo 07/18/21 EF 35-40% Severe MR. She developed cardiogenic shock and was started on DBA and NE. R/LHC with no coronary disease, low filling pressures. High output on DBA + Noepi. CMRI showed LVEF 60% RV 60%. LGE basal septal midwall. Nonspecific scar. Needs cardiac PET.  She had MDT ICD placed. She was able to wean off amio, lidocaine, DBA and NE gtt. GDMT limited by soft BP, unable to tolerate Entresto. Discharged home on cardiac sarcoid treatment protocol w/ prednisone and MTX taper. Bactrim for PJP prophylaxis. Weight 136 lbs.  Midodrine stopped and arranged for cardiac PET at [facility] at post hospital follow  up 12/22.  Today she returns for HF follow up with her daughter. Overall feeling fine. She does not have SOB walking on flat ground or getting around the house. She has occasional positional dizziness upon standing, no falls. Recently finished PT and not requiring her cane/walker much anymore. Denies palpitations, abnormal bleeding, CP, dizziness, edema, or PND/Orthopnea. Appetite ok. No fever or chills. Weight at home stable. Taking all medications. Daughter fills her pillbox. Patient lives with her husband, who is illiterate.  She has finished PT as of last week.    Cardiac studies - R/LHC (11/22): On NE 3 and DBA 5   Ao =  118/41 (67) LV =  121/9 RA =  2 RV = 52/4 PA = 56/6 (24) PCW = 7 Fick cardiac output/index = 11.2/6.6 PVR = 1.5 WU SVR = 463 FA sat = 99% PA sat = 82%, 82% SVC sat = 64%   Assessment: 1. Normal coronary arteries 2. EF 45% by v-gram 3. High cardiac output with low SVR on DBA 5 and NE 3. No evidence of intracardiac shunting.   - Echo (11/22): EF 35-40% Severe MR  - CMRI (11/22): LV EF 60% RV 60%. LVEF 60% RV 60% . LGE basal septal midwall. Nonspecific scar.   - Echo (12/20): EF ~45% MV appears thickened and possible rheumatic with 3+ MR. Severe LAE. Mod TR. Personally reviewed  - Echo (7/20):  30-35% , RV normal, MV mod-severe MR.   - Myoview (7/20): no ischemia, moderate LV dysfunction 30%   - TEE (8/21): moderate to severe functional central  MR. LVEF 40-45% w/ mild global HK.  - cMRI 04/24/19:  1.  Hilar lymphadenopathy consistent with sarcoidosis diagnosis.   2. Mildly dilated LV with EF 45%, there was mild diffuse hypokinesis with severe mid inferior hypokinesis.   3.  Normal RV size and systolic function, EF 57%.   4. Mitral regurgitation appeared severe (flow sequences to quantify were not done).   5. Possible subtle mid-wall LGE in the mid inferior wall (not seen on long axis views). This could be consistent with cardiac sarcoidosis. This  is not consistent with prior MI.  Review of systems complete and found to be negative unless listed in HPI.  Past Medical History:  Diagnosis Date   Bladder spasms    Cardiomegaly 03/15/2019   CHF (congestive heart failure) (HCC)    Chronic interstitial cystitis    Combined systolic and diastolic heart failure (HCC)    Dysuria    Elevated troponin 03/15/2019   End-stage glaucoma    RIGHT EYE   Frequency of urination    Legally blind in right eye, as defined in [place]    SECONDARY TO GLAUCOMA   Lymphadenopathy, hilar 03/15/2019   Multifocal atrial tachycardia (HCC)    Nocturia    Pleural effusion 03/15/2019   Prolonged QT interval 03/15/2019   Pulmonary sarcoidosis ([place]) 07/09/2019   Sarcoidosis 03/15/2019   Sarcoidosis of skin    Sensation of pressure in bladder area    Solitary pulmonary nodule 07/09/2019   Urgency of urination     Current Outpatient Medications  Medication Sig Dispense Refill   amiodarone (PACERONE) 200 MG tablet Take 200 mg by mouth daily.     atorvastatin (LIPITOR) 20 MG tablet Take 1 tablet (20 mg total) by mouth daily at 6 PM. Please make overdue appt with Dr. Radford Pax before anymore refills. Thank you 2nd attempt 15 tablet 0   calcium carbonate (TUMS) 500 MG chewable tablet Chew 3 tablets (600 mg of elemental calcium total) by mouth 2 (two) times daily with a meal. 90 tablet 3   donepezil (ARICEPT) 10 MG tablet Take 10 mg by mouth at bedtime.     folic acid (FOLVITE) 1 MG tablet Take 1 tablet (1 mg total) by mouth daily. 30 tablet 3   furosemide (LASIX) 20 MG tablet Take 1 tablet (20 mg total) by mouth daily. 30 tablet 3   guaiFENesin (MUCINEX) 600 MG 12 hr tablet Take 600 mg by mouth daily.     methotrexate (RHEUMATREX) 2.5 MG tablet On Wed 11/30 - Take 10 mg (4 tabs) On Wed 12/7 and Wed 12/14 - Take 12.5 mg (5 tabs) On Wed 12/21 and Wed 12/28 - Take 15 mg (6 tabs) On Wed 1/4 and Wed 1/11 - Take 17.5 mg (7 tabs) On 1/18 and every Wednesday there on - take 20 mg  (8 tabs) 32 tablet 0   Multiple Vitamins-Minerals (MULTIVITAMIN WITH MINERALS) tablet Take 1 tablet by mouth daily.     pantoprazole (PROTONIX) 20 MG tablet Take 1 tablet (20 mg total) by mouth daily. 30 tablet 3   potassium chloride SA (KLOR-CON) 20 MEQ tablet TAKE 1 TABLET BY MOUTH ONCE DAILY . APPOINTMENT REQUIRED FOR FUTURE REFILLS 30 tablet 6   predniSONE (DELTASONE) 10 MG tablet Now thru 12/21: 30 mg daily,  12/22-1/18 : 25 mg daily   1/19-2/15: 20 mg daily  2/16-3/15: 15 mg daily 3/16-3/29: 10 mg per day 3/30-4/12: 7.5 mg daily  4/13-4/26: 5 mg daily 4/27-5/10: 2.5 mg daily  5/11:  stop prednisone 90 tablet 9   spironolactone (ALDACTONE) 25 MG tablet Take 1 tablet (25 mg total) by mouth daily. 90 tablet 3   sulfamethoxazole-trimethoprim (BACTRIM DS) 800-160 MG tablet Take 1 tablet by mouth 3 (three) times a week. Stop taking on March 15th (when prednisone dose changes to 10 mg) 12 tablet 5   Vitamin D3 (VITAMIN D) 25 MCG tablet Take 1 tablet (1,000 Units total) by mouth daily. 30 tablet 3   No current facility-administered medications for this encounter.   Allergies  Allergen Reactions   Cinnamon Other (See Comments)    MOUTH ULCERS   Farxiga [Dapagliflozin] Other (See Comments)    Skin infection   Social History   Socioeconomic History   Marital status: Married    Spouse name: Not on file   Number of children: Not on file   Years of education: Not on file   Highest education level: Not on file  Occupational History   Not on file  Tobacco Use   Smoking status: Never   Smokeless tobacco: Never  Substance and Sexual Activity   Alcohol use: No   Drug use: No   Sexual activity: Not on file  Other Topics Concern   Not on file  Social History Narrative   Not on file   Social Determinants of Health   Financial Resource Strain: High Risk   Difficulty of Paying Living Expenses: Hard  Food Insecurity: No Food Insecurity   Worried About Running Out of Food in the Last  Year: Never true   Ran Out of Food in the Last Year: Never true  Transportation Needs: No Transportation Needs   Lack of Transportation (Medical): No   Lack of Transportation (Non-Medical): No  Physical Activity: Not on file  Stress: Not on file  Social Connections: Not on file  Intimate Partner Violence: Not on file   Family History  Problem Relation Age of Onset   CAD Father    Heart attack Father    Sarcoidosis Other    Healthy Sister    Hyperlipidemia Sister    Healthy Sister    [place] Readings from Last 3 Encounters:  09/16/21 59.6 kg (131 lb 6.4 oz)  08/04/21 59.1 kg (130 lb 3.2 oz)  07/28/21 61.9 kg (136 lb 7.4 oz)   BP (!) 132/56    Pulse 72    Wt 59.6 kg (131 lb 6.4 oz)    SpO2 99%    BMI 22.55 kg/m   General:  NAD. No resp difficulty, walked into clinic, thin HEENT: opaque right eye/blind Neck: Supple. No JVD. Carotids 2+ bilat; no bruits. No lymphadenopathy or thryomegaly appreciated. Cor: PMI nondisplaced. Regular rate & rhythm. No rubs, gallops or murmurs. Lungs: Clear Abdomen: Soft, nontender, nondistended. No hepatosplenomegaly. No bruits or masses. Good bowel sounds. Extremities: No cyanosis, clubbing, rash, edema Neuro: Alert & oriented x 3, cranial nerves grossly intact. Moves all 4 extremities w/o difficulty. Affect pleasant.  Device interrogation: Fluid trending up slightly, not crossed threshold. Thoracic impedence down. No AF, daily activity < 1 hour, no VT (personally reviewed).  ASSESSMENT & PLAN:    1. Chronic Systolic Heart Failure - cMRI 8/20 with EF 45%. RV normal. Minimal LGE -> doubt significant cardiac sarcoid - Echo 12/20 EF ~45% MV appears thickened and possible rheumatic with 3+ MR. Severe LAE. Mod TR.  - TEE 8/21: LVEF 40-45% w/ mild global HK moderate to severe functional central MR. - Echo 11/22 EF 35-40% Severe MR - cMRI LV  EF 60% RV 60%. LVEF 60% RV 60% . LGE basal septal midwall. Nonspecific scar. Recommending cardiac PET.   - Stable  NYHA II, volume looks good today on exam, OptiVol trending up. - Start Entresto 24/26 mg bid. Given samples today and patient assistance started. - Stop daily KCL supplement. Recent K 4.7. - Change Lasix to 20 mg daily PRN. - Continue spiro 25 mg daily.  - Unable to tolerate [place]. - Needs Cardiac PET at [facility], but now has no insurance. - Labs from PCP 09/12/21 reviewed, SCr 1.2, K 4.7; CMET in 10-14 days.  2. 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 Loanne Drilling in Pulmonary.  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  - cMRI EF 60% normal RV. LGE basal septal midwall. Nonspecific scar.   [facility] Immunosuppressive Therapy for Cardiac Sarcoid  - Continue prednisone 30 mg daily. Decrease to 20 mg 1/19 - 2/15. - Continue methotrexate 20 mg every Wednesday - Continue Bactrim DS 1 tablet Mon-Wed- Fri while taking >15 mg of prednisone daily  - Continue Folic Acid 1 mg daily  - Continue Calcium 1 tab bid with meals.  - Continue Vitamin D 1 tab daily.  - Continue Protonix 20 mg daily. - Unable to get Cardiac PET at [facility] w/o insurance (see below). - LFTs and CBC w/ diff 09/12/21 reviewed. Hgb 11.4, LFTs mildly elevated (see below)  3. 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 cMRI with LGE possible sarcoid. Need cardiac PET.  - s/p MDT ICD.  - Continue amio 200 mg daily. - Continue steroid/MTX protocol.  - Recent K and Mag ok.   4. MAT - Zio in 9/20 mostly SR, some PACs (4.7%), rare PVCs (<1.0%), 22 runs of SVT  - No AT on device interrogation today.   5. 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 - She had been referred for possible [employer]. Did not keep appointment last fall due to gap in insurance coverage - Severe MR on echo, but moderate on cMRI     6. Hypotension - Resolved. Off midodrine.  -  Adding back GDMT as able.  7. Elevated LFTs - Alk phos 249, AST 56, ALT 75 (09/12/21) - Patient is on statin, amiodarone and MTX. - Avoid ETOH acetaminophen. - Repeat LFTs in 2 weeks.  - If remains elevated, obtain RUQ [place] and refer to GI. Discussed with Dr. Haroldine Laws.  8. SDOH - Engage HFSW to help with insurance needs. - Application for Entresto patient assistance started.   Keep follow up with Dr. Haroldine Laws in 2 months as scheduled.  Allena Katz, FNP-BC 09/16/21

## 2021-09-16 ENCOUNTER — Ambulatory Visit (HOSPITAL_COMMUNITY)
Admission: RE | Admit: 2021-09-16 | Discharge: 2021-09-16 | Disposition: A | Discharge status: Home / Self Care | Payer: Self-pay | Source: Ambulatory Visit | Attending: Family Medicine | Admitting: Family Medicine

## 2021-09-16 ENCOUNTER — Encounter (HOSPITAL_COMMUNITY): Payer: Self-pay

## 2021-09-16 ENCOUNTER — Other Ambulatory Visit (HOSPITAL_COMMUNITY): Payer: Self-pay

## 2021-09-16 ENCOUNTER — Other Ambulatory Visit: Payer: Self-pay | Admitting: Internal Medicine

## 2021-09-16 ENCOUNTER — Telehealth (HOSPITAL_COMMUNITY): Payer: Self-pay | Admitting: Pharmacy Technician

## 2021-09-16 VITALS — BP 132/56 | HR 72 | Wt 131.4 lb

## 2021-09-16 DIAGNOSIS — I110 Hypertensive heart disease with heart failure: Secondary | ICD-10-CM | POA: Insufficient documentation

## 2021-09-16 DIAGNOSIS — Z8249 Family history of ischemic heart disease and other diseases of the circulatory system: Secondary | ICD-10-CM | POA: Insufficient documentation

## 2021-09-16 DIAGNOSIS — R7989 Other specified abnormal findings of blood chemistry: Secondary | ICD-10-CM

## 2021-09-16 DIAGNOSIS — I959 Hypotension, unspecified: Secondary | ICD-10-CM

## 2021-09-16 DIAGNOSIS — R590 Localized enlarged lymph nodes: Secondary | ICD-10-CM | POA: Insufficient documentation

## 2021-09-16 DIAGNOSIS — I5020 Unspecified systolic (congestive) heart failure: Secondary | ICD-10-CM

## 2021-09-16 DIAGNOSIS — I472 Ventricular tachycardia, unspecified: Secondary | ICD-10-CM

## 2021-09-16 DIAGNOSIS — Z5989 Other problems related to housing and economic circumstances: Secondary | ICD-10-CM | POA: Insufficient documentation

## 2021-09-16 DIAGNOSIS — Z95810 Presence of automatic (implantable) cardiac defibrillator: Secondary | ICD-10-CM | POA: Insufficient documentation

## 2021-09-16 DIAGNOSIS — I4891 Unspecified atrial fibrillation: Secondary | ICD-10-CM | POA: Insufficient documentation

## 2021-09-16 DIAGNOSIS — M8580 Other specified disorders of bone density and structure, unspecified site: Secondary | ICD-10-CM | POA: Insufficient documentation

## 2021-09-16 DIAGNOSIS — L905 Scar conditions and fibrosis of skin: Secondary | ICD-10-CM | POA: Insufficient documentation

## 2021-09-16 DIAGNOSIS — N3010 Interstitial cystitis (chronic) without hematuria: Secondary | ICD-10-CM | POA: Insufficient documentation

## 2021-09-16 DIAGNOSIS — Z8674 Personal history of sudden cardiac arrest: Secondary | ICD-10-CM | POA: Insufficient documentation

## 2021-09-16 DIAGNOSIS — Z139 Encounter for screening, unspecified: Secondary | ICD-10-CM

## 2021-09-16 DIAGNOSIS — I5022 Chronic systolic (congestive) heart failure: Secondary | ICD-10-CM | POA: Insufficient documentation

## 2021-09-16 DIAGNOSIS — I471 Supraventricular tachycardia: Secondary | ICD-10-CM

## 2021-09-16 DIAGNOSIS — I493 Ventricular premature depolarization: Secondary | ICD-10-CM | POA: Insufficient documentation

## 2021-09-16 DIAGNOSIS — Z7952 Long term (current) use of systemic steroids: Secondary | ICD-10-CM | POA: Insufficient documentation

## 2021-09-16 DIAGNOSIS — D869 Sarcoidosis, unspecified: Secondary | ICD-10-CM

## 2021-09-16 DIAGNOSIS — I340 Nonrheumatic mitral (valve) insufficiency: Secondary | ICD-10-CM

## 2021-09-16 DIAGNOSIS — R918 Other nonspecific abnormal finding of lung field: Secondary | ICD-10-CM | POA: Insufficient documentation

## 2021-09-16 DIAGNOSIS — I5042 Chronic combined systolic (congestive) and diastolic (congestive) heart failure: Secondary | ICD-10-CM

## 2021-09-16 DIAGNOSIS — R42 Dizziness and giddiness: Secondary | ICD-10-CM | POA: Insufficient documentation

## 2021-09-16 DIAGNOSIS — Z79899 Other long term (current) drug therapy: Secondary | ICD-10-CM | POA: Insufficient documentation

## 2021-09-16 MED ORDER — FUROSEMIDE 20 MG PO TABS: 20.0000 mg | ORAL_TABLET | Freq: Every day | ORAL | 3 refills | Status: DC | PRN

## 2021-09-16 MED ORDER — ENTRESTO 24-26 MG PO TABS: 1.0000 | ORAL_TABLET | Freq: Two times a day (BID) | ORAL | 3 refills | Status: DC

## 2021-09-16 NOTE — Telephone Encounter (Signed)
Advanced Heart Failure Patient Advocate Encounter  Patient was seen in clinic today and needs to renew [place] assistance, she is currently uninsured. Application started, will fax in once signatures are obtained. She is aware that POI is needed as well.

## 2021-09-16 NOTE — Progress Notes (Signed)
Medication Samples have been provided to the patient.  Drug name: Robin Estes       Strength: 24/26 mg        Qty: 2  LOT: [id number]  Exp.Date: 10/2023  Dosing instructions: Take 1 tablet Twice daily   The patient has been instructed regarding the correct time, dose, and frequency of taking this medication, including desired effects and most common side effects.   [name] 4:12 PM 09/16/2021

## 2021-09-16 NOTE — Patient Instructions (Signed)
Medication Changes:  Start Entresto 24/26 mg Twice daily (we have provided you samples while we await approval for patient assistance)  Change Furosemide to 20 mg ONLY AS NEEDED  Lab Work:  Your physician recommends that you return for lab work in: 1-2 weeks (Tue 1/31 at 3:30)  Testing/Procedures:  None  Referrals:  None  Special Instructions // Education:  Do the following things EVERYDAY: Weigh yourself in the morning before breakfast. Write it down and keep it in a log. Take your medicines as prescribed Eat low salt foods--Limit salt (sodium) to 2000 mg per day.  Stay as active as you can everyday Limit all fluids for the day to less than 2 liters   Follow-Up in: 2 months as scheduled (11/14/21)  At the Advanced Heart Failure Clinic, you and your health needs are our priority. We have a designated team specialized in the treatment of Heart Failure. This Care Team includes your primary Heart Failure Specialized Cardiologist (physician), Advanced Practice Providers (APPs- Physician Assistants and Nurse Practitioners), and Pharmacist who all work together to provide you with the care you need, when you need it.   You may see any of the following providers on your designated Care Team at your next follow up:  Dr Arvilla Meres Dr Carron Curie, NP Robbie Lis, [place] [facility] [place], [place] Karle Plumber, PharmD   Please be sure to bring in all your medications bottles to every appointment.   Need to Contact us:  If you have any questions or concerns before your next appointment please send [place] a message through [place] or call our office at [phone].    TO LEAVE A MESSAGE FOR THE NURSE SELECT OPTION 2, PLEASE LEAVE A MESSAGE INCLUDING: YOUR NAME DATE OF BIRTH CALL BACK NUMBER REASON FOR CALL**this is important as we prioritize the call backs  YOU WILL RECEIVE A CALL BACK THE SAME DAY AS LONG AS YOU CALL BEFORE 4:00 PM

## 2021-09-17 ENCOUNTER — Telehealth (HOSPITAL_COMMUNITY): Payer: Self-pay | Admitting: Licensed Clinical Social Worker

## 2021-09-17 NOTE — Telephone Encounter (Signed)
CSW received consult to reach out to pt dtr to discuss concerns with pt getting insurance established.  Has been having trouble getting more than just the traditional part A Medicare plan.  CSW provided pt dtr with number for [facility] so she can speak with an Geophysical data processor- encouraged her to reach back out to me if they are unable to help.  Will continue to follow and assist as needed  Jorge Ny, [facility] Desk#: [phone] Cell#: [phone]

## 2021-09-30 ENCOUNTER — Other Ambulatory Visit (HOSPITAL_COMMUNITY): Payer: Self-pay

## 2021-10-01 ENCOUNTER — Other Ambulatory Visit: Payer: Self-pay

## 2021-10-01 ENCOUNTER — Ambulatory Visit (HOSPITAL_COMMUNITY)
Admission: RE | Admit: 2021-10-01 | Discharge: 2021-10-01 | Disposition: A | Discharge status: Home / Self Care | Payer: Self-pay | Source: Ambulatory Visit | Attending: Internal Medicine | Admitting: Internal Medicine

## 2021-10-01 ENCOUNTER — Telehealth (HOSPITAL_COMMUNITY): Payer: Self-pay

## 2021-10-01 DIAGNOSIS — I5020 Unspecified systolic (congestive) heart failure: Secondary | ICD-10-CM | POA: Insufficient documentation

## 2021-10-01 DIAGNOSIS — I5042 Chronic combined systolic (congestive) and diastolic (congestive) heart failure: Secondary | ICD-10-CM

## 2021-10-01 LAB — COMPREHENSIVE METABOLIC PANEL
ALT: 54 U/L — ABNORMAL HIGH (ref 0–44)
AST: 43 U/L — ABNORMAL HIGH (ref 15–41)
Albumin: 3.2 g/dL — ABNORMAL LOW (ref 3.5–5.0)
Alkaline Phosphatase: 160 U/L — ABNORMAL HIGH (ref 38–126)
Anion gap: 12 (ref 5–15)
BUN: 13 mg/dL (ref 8–23)
CO2: 24 mmol/L (ref 22–32)
Calcium: 8.9 mg/dL (ref 8.9–10.3)
Chloride: 107 mmol/L (ref 98–111)
Creatinine, Ser: 1.09 mg/dL — ABNORMAL HIGH (ref 0.44–1.00)
GFR, Estimated: 53 mL/min — ABNORMAL LOW (ref >60)
Glucose, Bld: 112 mg/dL — ABNORMAL HIGH (ref 70–99)
Potassium: 3.2 mmol/L — ABNORMAL LOW (ref 3.5–5.1)
Sodium: 143 mmol/L (ref 135–145)
Total Bilirubin: 0.9 mg/dL (ref 0.3–1.2)
Total Protein: 5.7 g/dL — ABNORMAL LOW (ref 6.5–8.1)

## 2021-10-01 MED ORDER — POTASSIUM CHLORIDE CRYS ER 20 MEQ PO TBCR: 40.0000 meq | EXTENDED_RELEASE_TABLET | Freq: Two times a day (BID) | ORAL | 5 refills | Status: DC

## 2021-10-01 NOTE — Telephone Encounter (Signed)
Patients daughter advised and verbalized understanding,lab appointment scheduled,lab orders entered, Rx sent into patients pharmacy.   Orders Placed This Encounter  Procedures   Basic metabolic panel    Standing Status:   Future    Standing Expiration Date:   10/01/2022    Order Specific Question:   Release to patient    Answer:   Immediate   Meds ordered this encounter  Medications   potassium chloride SA (KLOR-CON M) 20 MEQ tablet    Sig: Take 2 tablets (40 mEq total) by mouth 2 (two) times daily.    Dispense:  120 tablet    Refill:  5    Please cancel all previous orders for current medication. Change in dosage or pill size.

## 2021-10-01 NOTE — Telephone Encounter (Signed)
-----   Message from Jacklynn Ganong, [place] sent at 10/01/2021  4:40 PM EST ----- K is low. LFTS are trending down. Take extra 40 KCL x 1 today and increase daily KCL to 40 bid. Repeat BMET in 1 week.

## 2021-10-03 ENCOUNTER — Other Ambulatory Visit (HOSPITAL_COMMUNITY): Payer: Self-pay

## 2021-10-03 MED ORDER — METHOTREXATE 2.5 MG PO TABS: 20.0000 mg | ORAL_TABLET | ORAL | 3 refills | Status: DC

## 2021-10-03 MED ORDER — ENTRESTO 24-26 MG PO TABS: 1.0000 | ORAL_TABLET | Freq: Two times a day (BID) | ORAL | 3 refills | Status: DC

## 2021-10-09 ENCOUNTER — Other Ambulatory Visit (HOSPITAL_COMMUNITY): Payer: Self-pay

## 2021-10-10 NOTE — Telephone Encounter (Signed)
Sent in application via fax.  Will follow up.  

## 2021-10-20 NOTE — Telephone Encounter (Signed)
Advanced Heart Failure Patient Advocate Encounter  Called [employer] to check the status of the patient's application. Representative stated that the patient needs POI. Called and spoke with the patient's daughter. She will bring in POI. Requested a bottle of samples from [place] (CMA).

## 2021-10-23 ENCOUNTER — Ambulatory Visit (HOSPITAL_COMMUNITY)
Admission: RE | Admit: 2021-10-23 | Discharge: 2021-10-23 | Disposition: A | Discharge status: Home / Self Care | Payer: Self-pay | Source: Ambulatory Visit | Attending: Internal Medicine | Admitting: Internal Medicine

## 2021-10-23 ENCOUNTER — Other Ambulatory Visit: Payer: Self-pay

## 2021-10-23 DIAGNOSIS — I5042 Chronic combined systolic (congestive) and diastolic (congestive) heart failure: Secondary | ICD-10-CM | POA: Insufficient documentation

## 2021-10-23 LAB — BASIC METABOLIC PANEL
Anion gap: 9 (ref 5–15)
BUN: 35 mg/dL — ABNORMAL HIGH (ref 8–23)
CO2: 20 mmol/L — ABNORMAL LOW (ref 22–32)
Calcium: 9.3 mg/dL (ref 8.9–10.3)
Chloride: 102 mmol/L (ref 98–111)
Creatinine, Ser: 1.87 mg/dL — ABNORMAL HIGH (ref 0.44–1.00)
GFR, Estimated: 28 mL/min — ABNORMAL LOW (ref >60)
Glucose, Bld: 117 mg/dL — ABNORMAL HIGH (ref 70–99)
Potassium: 4.6 mmol/L (ref 3.5–5.1)
Sodium: 131 mmol/L — ABNORMAL LOW (ref 135–145)

## 2021-10-28 ENCOUNTER — Ambulatory Visit (INDEPENDENT_AMBULATORY_CARE_PROVIDER_SITE_OTHER): Payer: Self-pay

## 2021-10-28 DIAGNOSIS — I4901 Ventricular fibrillation: Secondary | ICD-10-CM

## 2021-10-28 DIAGNOSIS — I469 Cardiac arrest, cause unspecified: Secondary | ICD-10-CM

## 2021-10-29 LAB — CUP PACEART REMOTE DEVICE CHECK
Battery Remaining Longevity: 127 mo
Battery Voltage: 3.07 V
Brady Statistic AP VP Percent: 0.02 %
Brady Statistic AP VS Percent: 1.73 %
Brady Statistic AS VP Percent: 0.03 %
Brady Statistic AS VS Percent: 98.22 %
Brady Statistic RA Percent Paced: 1.75 %
Brady Statistic RV Percent Paced: 0.04 %
Date Time Interrogation Session: 20230228072205
HighPow Impedance: 58 Ohm
Implantable Lead Implant Date: 20221125
Implantable Lead Implant Date: 20221125
Implantable Lead Location: 753859
Implantable Lead Location: 753860
Implantable Lead Model: 5076
Implantable Lead Model: 6935
Implantable Pulse Generator Implant Date: 20221125
Lead Channel Impedance Value: 399 Ohm
Lead Channel Impedance Value: 437 Ohm
Lead Channel Impedance Value: 494 Ohm
Lead Channel Pacing Threshold Amplitude: 0.625 V
Lead Channel Pacing Threshold Amplitude: 0.625 V
Lead Channel Pacing Threshold Pulse Width: 0.4 ms
Lead Channel Pacing Threshold Pulse Width: 0.4 ms
Lead Channel Sensing Intrinsic Amplitude: 3.875 mV
Lead Channel Sensing Intrinsic Amplitude: 3.875 mV
Lead Channel Sensing Intrinsic Amplitude: 7.625 mV
Lead Channel Sensing Intrinsic Amplitude: 7.625 mV
Lead Channel Setting Pacing Amplitude: 1.5 V
Lead Channel Setting Pacing Amplitude: 3 V
Lead Channel Setting Pacing Pulse Width: 0.4 ms
Lead Channel Setting Sensing Sensitivity: 0.3 mV

## 2021-10-30 ENCOUNTER — Telehealth (HOSPITAL_COMMUNITY): Payer: Self-pay

## 2021-10-30 DIAGNOSIS — I5042 Chronic combined systolic (congestive) and diastolic (congestive) heart failure: Secondary | ICD-10-CM

## 2021-10-30 NOTE — Telephone Encounter (Signed)
-----   Message from Rafael Bihari, [place] sent at 10/24/2021  7:54 AM EST ----- ?K improved  but kidney function very elevated. Stop Entresto and spiro.  Please make sure patient is not taking Lasix regularly (I changed to PRN last visit). Do not take any Lasix. Repeat BMET early next week. ?

## 2021-10-30 NOTE — Telephone Encounter (Signed)
Patient advised and verbalized understanding. Lab appt scheduled, lab order entered. Patient daughter confirms patient has not been taking lasix. Med list updated to reflect changes.  ? ?Orders Placed This Encounter  ?Procedures  ? Basic metabolic panel  ?  Standing Status:   Future  ?  Standing Expiration Date:   10/31/2022  ?  Order Specific Question:   Release to patient  ?  Answer:   Immediate  ? ? ?

## 2021-11-04 ENCOUNTER — Ambulatory Visit (HOSPITAL_COMMUNITY)
Admission: RE | Admit: 2021-11-04 | Discharge: 2021-11-04 | Disposition: A | Discharge status: Home / Self Care | Payer: Self-pay | Source: Ambulatory Visit | Attending: Internal Medicine | Admitting: Internal Medicine

## 2021-11-04 ENCOUNTER — Other Ambulatory Visit: Payer: Self-pay

## 2021-11-04 DIAGNOSIS — I5042 Chronic combined systolic (congestive) and diastolic (congestive) heart failure: Secondary | ICD-10-CM | POA: Insufficient documentation

## 2021-11-04 LAB — BASIC METABOLIC PANEL
Anion gap: 6 (ref 5–15)
BUN: 31 mg/dL — ABNORMAL HIGH (ref 8–23)
CO2: 24 mmol/L (ref 22–32)
Calcium: 9.1 mg/dL (ref 8.9–10.3)
Chloride: 108 mmol/L (ref 98–111)
Creatinine, Ser: 1.78 mg/dL — ABNORMAL HIGH (ref 0.44–1.00)
GFR, Estimated: 29 mL/min — ABNORMAL LOW (ref >60)
Glucose, Bld: 179 mg/dL — ABNORMAL HIGH (ref 70–99)
Potassium: 4.8 mmol/L (ref 3.5–5.1)
Sodium: 138 mmol/L (ref 135–145)

## 2021-11-05 ENCOUNTER — Telehealth (HOSPITAL_COMMUNITY): Payer: Self-pay

## 2021-11-05 ENCOUNTER — Other Ambulatory Visit (HOSPITAL_COMMUNITY): Payer: Self-pay

## 2021-11-05 ENCOUNTER — Other Ambulatory Visit: Payer: Self-pay

## 2021-11-05 MED ORDER — ATORVASTATIN CALCIUM 20 MG PO TABS: 20.0000 mg | ORAL_TABLET | Freq: Every day | ORAL | 0 refills | Status: DC

## 2021-11-05 NOTE — Telephone Encounter (Addendum)
Pt aware, agreeable, and verbalized understanding ? ?Stated stop Entresto and Cleda Daub per last weeks instructions and Lasix is only PRN ? ?----- Message from Jacklynn Ganong, FNP sent at 11/05/2021  9:00 AM EST ----- ?SCr remained elevated from baseline. Please call patient and ensure Entresto and spiro were stopped, keep Lasix PRN. ?

## 2021-11-05 NOTE — Progress Notes (Signed)
Remote ICD transmission.   

## 2021-11-14 ENCOUNTER — Encounter (HOSPITAL_COMMUNITY): Payer: Self-pay | Admitting: Internal Medicine

## 2021-11-21 ENCOUNTER — Encounter: Payer: Self-pay | Admitting: Internal Medicine

## 2021-12-25 ENCOUNTER — Other Ambulatory Visit (HOSPITAL_COMMUNITY): Payer: Self-pay | Admitting: Internal Medicine

## 2021-12-26 ENCOUNTER — Other Ambulatory Visit: Payer: Self-pay

## 2021-12-30 ENCOUNTER — Telehealth: Payer: Self-pay | Admitting: Cardiology

## 2021-12-30 MED ORDER — ATORVASTATIN CALCIUM 20 MG PO TABS: 20.0000 mg | ORAL_TABLET | Freq: Every day | ORAL | 0 refills | Status: DC

## 2021-12-30 NOTE — Telephone Encounter (Signed)
Pt scheduled to see Chelsea Aus, PA-C 01/29/22,  sent in #30 day supply of Atorvastatin to [facility]. ?

## 2021-12-30 NOTE — Telephone Encounter (Signed)
?*  STAT* If patient is at the pharmacy, call can be transferred to refill team. ? ? ?1. Which medications need to be refilled? (please list name of each medication and dose if known) atorvastatin (LIPITOR) 20 MG tablet ? ?2. Which pharmacy/location (including street and city if local pharmacy) is medication to be sent to? [facility] - [place], [place] - [address] ? ?3. Do they need a 30 day or 90 day supply? 30 day ? ?Patient has an appointment 01/29/2022 ?

## 2022-01-27 ENCOUNTER — Ambulatory Visit (INDEPENDENT_AMBULATORY_CARE_PROVIDER_SITE_OTHER): Payer: Self-pay

## 2022-01-27 DIAGNOSIS — I472 Ventricular tachycardia, unspecified: Secondary | ICD-10-CM

## 2022-01-28 LAB — CUP PACEART REMOTE DEVICE CHECK
Battery Remaining Longevity: 125 mo
Battery Voltage: 3.05 V
Brady Statistic AP VP Percent: 0.05 %
Brady Statistic AP VS Percent: 9.24 %
Brady Statistic AS VP Percent: 0.03 %
Brady Statistic AS VS Percent: 90.68 %
Brady Statistic RA Percent Paced: 9.27 %
Brady Statistic RV Percent Paced: 0.07 %
Date Time Interrogation Session: 20230530012404
HighPow Impedance: 51 Ohm
Implantable Lead Implant Date: 20221125
Implantable Lead Implant Date: 20221125
Implantable Lead Location: 753859
Implantable Lead Location: 753860
Implantable Lead Model: 5076
Implantable Lead Model: 6935
Implantable Pulse Generator Implant Date: 20221125
Lead Channel Impedance Value: 342 Ohm
Lead Channel Impedance Value: 380 Ohm
Lead Channel Impedance Value: 437 Ohm
Lead Channel Pacing Threshold Amplitude: 0.625 V
Lead Channel Pacing Threshold Amplitude: 0.625 V
Lead Channel Pacing Threshold Pulse Width: 0.4 ms
Lead Channel Pacing Threshold Pulse Width: 0.4 ms
Lead Channel Sensing Intrinsic Amplitude: 2.75 mV
Lead Channel Sensing Intrinsic Amplitude: 2.75 mV
Lead Channel Sensing Intrinsic Amplitude: 6.75 mV
Lead Channel Sensing Intrinsic Amplitude: 6.75 mV
Lead Channel Setting Pacing Amplitude: 1.5 V
Lead Channel Setting Pacing Amplitude: 2 V
Lead Channel Setting Pacing Pulse Width: 0.4 ms
Lead Channel Setting Sensing Sensitivity: 0.3 mV

## 2022-01-29 ENCOUNTER — Other Ambulatory Visit (HOSPITAL_COMMUNITY): Payer: Self-pay | Admitting: Internal Medicine

## 2022-01-29 ENCOUNTER — Ambulatory Visit: Payer: Self-pay | Admitting: Physician Assistant

## 2022-01-29 ENCOUNTER — Telehealth: Payer: Self-pay | Admitting: Cardiology

## 2022-01-29 MED ORDER — ATORVASTATIN CALCIUM 20 MG PO TABS: 20.0000 mg | ORAL_TABLET | Freq: Every day | ORAL | 0 refills | Status: DC

## 2022-01-29 NOTE — Telephone Encounter (Signed)
*  STAT* If patient is at the pharmacy, call can be transferred to refill team.   1. Which medications need to be refilled? (please list name of each medication and dose if known)   atorvastatin (LIPITOR) 20 MG tablet   2. Which pharmacy/location (including street and city if local pharmacy) is medication to be sent to?  [facility] - [place], [place] - [address] Phone:  [phone]        3. Do they need a 30 day or 90 day supply?  30 day

## 2022-01-29 NOTE — Progress Notes (Deleted)
Cardiology Office Note:    Date:  01/29/2022   ID:  Mackey Birchwood, DOB 31-Oct-1945, MRN [id number]  PCP:  Nicholes Rough, PA-C  [facility] Cardiologist:  Fransico Him, MD  [facility] Electrophysiologist:  None   Chief Complaint: follow up  History of Present Illness:    Robin Estes is a 76 y.o. female with a hx of systolic heart failure, interstitial cystitis, sarcoidosis (eye, lung, and skin) followed by dermatology, osteopenia, MAT, HTN, MR and pulmonary nodules on CT 03/2019 seen for follow up.    In July 2020 she was admitted to [facility] with increased shortness breath. EKG showed A fib/MAT. ECHO  EF 30-35%  and mod-severe MR.GDMT initiated  cMRI 8/20 with EF 45%. RV normal. Minimal LGE -> doubt significant cardiac sarcoid. Echo 12/20 EF ~45% MV appears thickened and possible rheumatic with 3+ MR. Severe LAE. Mod TR.  TEE 8/21 LVEF 40-45% w/ mild global HK with moderate to severe functional central MR.Marland Kitchen Referred to Dr. Burt Knack for consideration of [facility] but did not keep the appointment d/t gap in insurance coverage.   She presented to [facility] 07/17/21 after syncopal episode. Unfortunately had cardiac arrest/Vfib (CPR time approx 5 minutes; 1 dose of epi; she was successfully shocked out of it). Post ROSC she had MAT/Afib ,no signs of ST elevation, and soft BPs. Placed empirically on cardiac sarcoid protocol w/ steroids/ MTX. Echo 07/18/21 EF 35-40% Severe MR. She developed cardiogenic shock and was started on DBA and NE. R/LHC with no coronary disease, low filling pressures. High output on DBA + Noepi. CMRI showed LVEF 60% RV 60%. LGE basal septal midwall. Nonspecific scar.  GDMT limited by soft BP, unable to tolerate Entresto. Discharged home on cardiac sarcoid treatment protocol w/ prednisone and MTX taper.    Needs Cardiac PET at [facility], but now has no insurance. Last seen at Heart Failure clinic 08/2021.    Here today for follow up.  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   Sarcoidosis 03/15/2019   Sarcoidosis of skin    Sensation of pressure in bladder area    Solitary pulmonary nodule 07/09/2019   Urgency of urination     Past Surgical History:  Procedure Laterality Date   CATARACT EXTRACTION W/ INTRAOCULAR LENS IMPLANT Left    CYSTOSCOPY WITH HYDRODISTENSION AND BIOPSY N/A 04/21/2013   Procedure: CYSTOSCOPY/BLADDER BIOPSY/HYDRODISTENSION;  Surgeon: Ailene Rud, MD;  Location: [facility];  Service: Urology;  Laterality: N/A;   ICD IMPLANT N/A 07/25/2021   Procedure: ICD IMPLANT;  Surgeon: Evans Lance, MD;  Location: [place] [facility];  Service: Cardiovascular;  Laterality: N/A;   RIGHT/LEFT HEART CATH AND CORONARY ANGIOGRAPHY N/A 07/22/2021   Procedure: RIGHT/LEFT HEART CATH AND CORONARY ANGIOGRAPHY;  Surgeon: Jolaine Artist, MD;  Location: [place] [facility];  Service: Cardiovascular;  Laterality: N/A;   TEE WITHOUT  CARDIOVERSION N/A 04/08/2020   Procedure: TRANSESOPHAGEAL ECHOCARDIOGRAM (TEE);  Surgeon: Jolaine Artist, MD;  Location: [facility] ENDOSCOPY;  Service: Cardiovascular;  Laterality: N/A;    Current Medications: No outpatient medications have been marked as taking for the 01/29/22 encounter (Appointment) with Leanor Kail, [place].     Allergies:   Cinnamon and [place] [dapagliflozin]   Social History   Socioeconomic History   Marital status: Married    Spouse name: Not on  file   Number of children: Not on file   Years of education: Not on file   Highest education level: Not on file  Occupational History   Not on file  Tobacco Use   Smoking status: Never   Smokeless tobacco: Never  Substance and Sexual Activity   Alcohol use: No   Drug use: No   Sexual activity: Not on file  Other Topics Concern   Not on file  Social History Narrative   Not on file   Social Determinants of Health   Financial Resource Strain: High Risk   Difficulty of Paying Living Expenses: Hard  Food Insecurity: No Food Insecurity   Worried About Running Out of Food in the Last Year: Never true   Ran Out of Food in the Last Year: Never true  Transportation Needs: No Transportation Needs   Lack of Transportation (Medical): No   Lack of Transportation (Non-Medical): No  Physical Activity: Not on file  Stress: Not on file  Social Connections: Not on file     Family History: The patient's family history includes CAD in her father; Healthy in her sister and sister; Heart attack in her father; Hyperlipidemia in her sister; Sarcoidosis in an other family member.  ***  ROS:   Please see the history of present illness.    All other systems reviewed and are negative. ***  EKGs/Labs/Other Studies Reviewed:    The following studies were reviewed today: ***  EKG:  EKG is *** ordered today.  The ekg ordered today demonstrates ***  Recent Labs: 07/18/2021: B Natriuretic Peptide 893.2 07/24/2021: Magnesium 1.9 08/04/2021: Hemoglobin 9.3; Platelets 290 10/01/2021: ALT 54 11/04/2021: BUN 31; Creatinine, Ser 1.78; Potassium 4.8; Sodium 138  Recent Lipid Panel    Component Value Date/Time   CHOL 163 03/16/2019 0225   TRIG 69 03/16/2019 0225   HDL 37 (L) 03/16/2019 0225   CHOLHDL 4.4 03/16/2019 0225   VLDL 14 03/16/2019 0225   LDLCALC 112 (H) 03/16/2019 0225     Risk Assessment/Calculations:   {Does this patient have ATRIAL FIBRILLATION?:[phone]}   Physical Exam:    VS:   There were no vitals taken for this visit.    Wt Readings from Last 3 Encounters:  09/16/21 131 lb 6.4 oz (59.6 kg)  08/04/21 130 lb 3.2 oz (59.1 kg)  07/28/21 136 lb 7.4 oz (61.9 kg)     GEN: *** Well nourished, well developed in no acute distress HEENT: Normal NECK: No JVD; No carotid bruits LYMPHATICS: No lymphadenopathy CARDIAC: ***RRR, no murmurs, rubs, gallops RESPIRATORY:  Clear to auscultation without rales, wheezing or rhonchi  ABDOMEN: Soft, non-tender, non-distended MUSCULOSKELETAL:  No edema; No deformity  SKIN: Warm and dry NEUROLOGIC:  Alert and oriented x 3 PSYCHIATRIC:  Normal affect   ASSESSMENT AND PLAN:    ***  2. ***  Medication Adjustments/Labs and Tests Ordered: Current medicines are reviewed at length with the patient today.  Concerns regarding medicines are outlined above.  No orders of the defined types were placed in this  encounter.  No orders of the defined types were placed in this encounter.   There are no Patient Instructions on file for this visit.   Jarrett Soho, [place]  01/29/2022 9:18 AM    [place]

## 2022-01-29 NOTE — Telephone Encounter (Signed)
Pt's medication was sent to pt's pharmacy as requested. Confirmation received.  °

## 2022-02-12 NOTE — Progress Notes (Signed)
Remote ICD transmission.   

## 2022-02-13 ENCOUNTER — Other Ambulatory Visit (HOSPITAL_COMMUNITY): Payer: Self-pay | Admitting: *Deleted

## 2022-02-13 MED ORDER — FOLIC ACID 1 MG PO TABS: 1.0000 mg | ORAL_TABLET | Freq: Every day | ORAL | 3 refills | Status: DC

## 2022-02-13 MED ORDER — PANTOPRAZOLE SODIUM 20 MG PO TBEC: 20.0000 mg | DELAYED_RELEASE_TABLET | Freq: Every day | ORAL | 3 refills | Status: DC

## 2022-02-19 ENCOUNTER — Ambulatory Visit (HOSPITAL_COMMUNITY)
Admission: RE | Admit: 2022-02-19 | Discharge: 2022-02-19 | Disposition: A | Discharge status: Home / Self Care | Payer: Self-pay | Source: Ambulatory Visit | Attending: Internal Medicine | Admitting: Internal Medicine

## 2022-02-19 ENCOUNTER — Encounter (HOSPITAL_COMMUNITY): Payer: Self-pay | Admitting: Internal Medicine

## 2022-02-19 VITALS — BP 128/68 | HR 70 | Wt 132.0 lb

## 2022-02-19 DIAGNOSIS — I340 Nonrheumatic mitral (valve) insufficiency: Secondary | ICD-10-CM

## 2022-02-19 DIAGNOSIS — I493 Ventricular premature depolarization: Secondary | ICD-10-CM | POA: Insufficient documentation

## 2022-02-19 DIAGNOSIS — Z79899 Other long term (current) drug therapy: Secondary | ICD-10-CM | POA: Insufficient documentation

## 2022-02-19 DIAGNOSIS — I472 Ventricular tachycardia, unspecified: Secondary | ICD-10-CM

## 2022-02-19 DIAGNOSIS — I4891 Unspecified atrial fibrillation: Secondary | ICD-10-CM | POA: Insufficient documentation

## 2022-02-19 DIAGNOSIS — I4901 Ventricular fibrillation: Secondary | ICD-10-CM

## 2022-02-19 DIAGNOSIS — I471 Supraventricular tachycardia: Secondary | ICD-10-CM | POA: Insufficient documentation

## 2022-02-19 DIAGNOSIS — Z8674 Personal history of sudden cardiac arrest: Secondary | ICD-10-CM | POA: Insufficient documentation

## 2022-02-19 DIAGNOSIS — L905 Scar conditions and fibrosis of skin: Secondary | ICD-10-CM | POA: Insufficient documentation

## 2022-02-19 DIAGNOSIS — D869 Sarcoidosis, unspecified: Secondary | ICD-10-CM | POA: Insufficient documentation

## 2022-02-19 DIAGNOSIS — I469 Cardiac arrest, cause unspecified: Secondary | ICD-10-CM

## 2022-02-19 DIAGNOSIS — N179 Acute kidney failure, unspecified: Secondary | ICD-10-CM | POA: Insufficient documentation

## 2022-02-19 DIAGNOSIS — I5020 Unspecified systolic (congestive) heart failure: Secondary | ICD-10-CM | POA: Insufficient documentation

## 2022-02-19 DIAGNOSIS — N3010 Interstitial cystitis (chronic) without hematuria: Secondary | ICD-10-CM | POA: Insufficient documentation

## 2022-02-19 DIAGNOSIS — M8580 Other specified disorders of bone density and structure, unspecified site: Secondary | ICD-10-CM | POA: Insufficient documentation

## 2022-02-19 DIAGNOSIS — I110 Hypertensive heart disease with heart failure: Secondary | ICD-10-CM | POA: Insufficient documentation

## 2022-02-19 LAB — COMPREHENSIVE METABOLIC PANEL
ALT: 26 U/L (ref 0–44)
AST: 47 U/L — ABNORMAL HIGH (ref 15–41)
Albumin: 3.8 g/dL (ref 3.5–5.0)
Alkaline Phosphatase: 171 U/L — ABNORMAL HIGH (ref 38–126)
Anion gap: 9 (ref 5–15)
BUN: 11 mg/dL (ref 8–23)
CO2: 23 mmol/L (ref 22–32)
Calcium: 9.2 mg/dL (ref 8.9–10.3)
Chloride: 108 mmol/L (ref 98–111)
Creatinine, Ser: 1.35 mg/dL — ABNORMAL HIGH (ref 0.44–1.00)
GFR, Estimated: 40 mL/min — ABNORMAL LOW (ref >60)
Glucose, Bld: 98 mg/dL (ref 70–99)
Potassium: 3.9 mmol/L (ref 3.5–5.1)
Sodium: 140 mmol/L (ref 135–145)
Total Bilirubin: 0.8 mg/dL (ref 0.3–1.2)
Total Protein: 6.2 g/dL — ABNORMAL LOW (ref 6.5–8.1)

## 2022-02-19 LAB — CBC
HCT: 34.4 % — ABNORMAL LOW (ref 36.0–46.0)
Hemoglobin: 11 g/dL — ABNORMAL LOW (ref 12.0–15.0)
MCH: 30.6 pg (ref 26.0–34.0)
MCHC: 32 g/dL (ref 30.0–36.0)
MCV: 95.6 fL (ref 80.0–100.0)
Platelets: 263 10*3/uL (ref 150–400)
RBC: 3.6 MIL/uL — ABNORMAL LOW (ref 3.87–5.11)
RDW: 14.6 % (ref 11.5–15.5)
WBC: 5 10*3/uL (ref 4.0–10.5)
nRBC: 0 % (ref 0.0–0.2)

## 2022-02-19 LAB — T4, FREE: Free T4: 1.38 ng/dL — ABNORMAL HIGH (ref 0.61–1.12)

## 2022-02-19 LAB — TSH: TSH: 0.256 u[IU]/mL — ABNORMAL LOW (ref 0.350–4.500)

## 2022-02-19 LAB — BRAIN NATRIURETIC PEPTIDE: B Natriuretic Peptide: 87.8 pg/mL (ref 0.0–100.0)

## 2022-02-19 MED ORDER — SPIRONOLACTONE 25 MG PO TABS: 12.5000 mg | ORAL_TABLET | Freq: Every day | ORAL | 3 refills | Status: DC

## 2022-02-19 NOTE — Addendum Note (Signed)
Encounter addended by: Dolores Patty, MD on: 02/19/2022 6:52 PM  Actions taken: Charge Capture section accepted

## 2022-02-19 NOTE — Patient Instructions (Signed)
STOP Protnoix, Vit D and Potassium.  START Spironolactone 12.5mg  (1/2 tab) daily.  Labs done today, your results will be available in MyChart, we will contact you for abnormal readings.   Repeat blood work in 7 days   Your physician has requested that you have an echocardiogram. Echocardiography is a painless test that uses sound waves to create images of your heart. It provides your doctor with information about the size and shape of your heart and how well your heart's chambers and valves are working. This procedure takes approximately one hour. There are no restrictions for this procedure.  Your physician recommends that you schedule a follow-up appointment in: 3 months    If you have any questions or concerns before your next appointment please send [place] a message through [place] or call our office at [phone].    TO LEAVE A MESSAGE FOR THE NURSE SELECT OPTION 2, PLEASE LEAVE A MESSAGE INCLUDING: YOUR NAME DATE OF BIRTH CALL BACK NUMBER REASON FOR CALL**this is important as we prioritize the call backs  YOU WILL RECEIVE A CALL BACK THE SAME DAY AS LONG AS YOU CALL BEFORE 4:00 PM  At the [facility], you and your health needs are our priority. As part of our continuing mission to provide you with exceptional heart care, we have created designated Provider Care Teams. These Care Teams include your primary Cardiologist (physician) and Advanced Practice Providers (APPs- Physician Assistants and Nurse Practitioners) who all work together to provide you with the care you need, when you need it.   You may see any of the following providers on your designated Care Team at your next follow up: Dr Arvilla Meres Dr Carron Curie, NP Robbie Lis, [place] [facility] [place], [place] Karle Plumber, PharmD   Please be sure to bring in all your medications bottles to every appointment.

## 2022-02-20 LAB — T3, FREE: T3, Free: 2.5 pg/mL (ref 2.0–4.4)

## 2022-02-27 ENCOUNTER — Ambulatory Visit (HOSPITAL_COMMUNITY)
Admission: RE | Admit: 2022-02-27 | Discharge: 2022-02-27 | Disposition: A | Discharge status: Home / Self Care | Payer: Self-pay | Source: Ambulatory Visit | Attending: Internal Medicine | Admitting: Internal Medicine

## 2022-02-27 DIAGNOSIS — I110 Hypertensive heart disease with heart failure: Secondary | ICD-10-CM | POA: Insufficient documentation

## 2022-02-27 DIAGNOSIS — I5020 Unspecified systolic (congestive) heart failure: Secondary | ICD-10-CM

## 2022-02-27 DIAGNOSIS — I081 Rheumatic disorders of both mitral and tricuspid valves: Secondary | ICD-10-CM | POA: Insufficient documentation

## 2022-02-27 LAB — BASIC METABOLIC PANEL
Anion gap: 11 (ref 5–15)
BUN: 15 mg/dL (ref 8–23)
CO2: 18 mmol/L — ABNORMAL LOW (ref 22–32)
Calcium: 9 mg/dL (ref 8.9–10.3)
Chloride: 111 mmol/L (ref 98–111)
Creatinine, Ser: 1.64 mg/dL — ABNORMAL HIGH (ref 0.44–1.00)
GFR, Estimated: 32 mL/min — ABNORMAL LOW (ref >60)
Glucose, Bld: 136 mg/dL — ABNORMAL HIGH (ref 70–99)
Potassium: 3.7 mmol/L (ref 3.5–5.1)
Sodium: 140 mmol/L (ref 135–145)

## 2022-02-28 LAB — ECHOCARDIOGRAM COMPLETE
AV Mean grad: 2 mmHg
AV Peak grad: 6.2 mmHg
Ao pk vel: 1.24 m/s
Area-P 1/2: 2.5 cm2
MV M vel: 5.54 m/s
MV Peak grad: 122.8 mmHg
S' Lateral: 3.1 cm

## 2022-03-04 ENCOUNTER — Other Ambulatory Visit (HOSPITAL_COMMUNITY): Payer: Self-pay | Admitting: Internal Medicine

## 2022-03-05 ENCOUNTER — Telehealth: Payer: Self-pay

## 2022-03-05 ENCOUNTER — Ambulatory Visit: Payer: Self-pay | Admitting: Physician Assistant

## 2022-03-05 NOTE — Telephone Encounter (Signed)
Spoke with the patients daughter and made her aware that Vin reviewed the patients chart and the appointment for today is not needed. She voiced understanding and thanked me for calling. She apologized for her spouse being rude this morning and requested a refill on Amiodarone. I reviewed the chart and informed her that the rx had been sent yesterday by Dr Bensimhon's office. She voiced understanding and thanked me for all my help.

## 2022-03-05 NOTE — Telephone Encounter (Signed)
Robin Estes reviewed the patients chart and stated the patient does not need to be seen in our office since the patient is now followed by the heart failure clinic.   I contacted the patients home number and a gentleman answered the phone. I asked if I could speak with Robin Estes and he asked me which one. He stated there is a mother and daughter and wanted to know which person I wanted to speak with. I asked for his name telling him I need to verify who I'm speaking with before giving out patient information. He states if he has to give up his name he is going to hang up.   Attempted to call the patients work number but the receptionist asked what dept did I need her to transfer me to. I know her I was unaware because this number was left as a good number to call. She states the name does not sound familiar and she could not help.   Per Robin Estes we tried and we will see if the patient comes to the appointment.

## 2022-03-16 ENCOUNTER — Other Ambulatory Visit: Payer: Self-pay

## 2022-03-16 MED ORDER — ATORVASTATIN CALCIUM 20 MG PO TABS: 20.0000 mg | ORAL_TABLET | Freq: Every day | ORAL | 11 refills | Status: DC

## 2022-03-19 ENCOUNTER — Encounter (HOSPITAL_COMMUNITY): Payer: Self-pay | Admitting: *Deleted

## 2022-04-28 ENCOUNTER — Ambulatory Visit (INDEPENDENT_AMBULATORY_CARE_PROVIDER_SITE_OTHER): Payer: Self-pay

## 2022-04-28 DIAGNOSIS — I517 Cardiomegaly: Secondary | ICD-10-CM

## 2022-04-29 LAB — CUP PACEART REMOTE DEVICE CHECK
Battery Remaining Longevity: 120 mo
Battery Voltage: 3.04 V
Brady Statistic AP VP Percent: 5.4 %
Brady Statistic AP VS Percent: 18.21 %
Brady Statistic AS VP Percent: 0.04 %
Brady Statistic AS VS Percent: 76.35 %
Brady Statistic RA Percent Paced: 23.3 %
Brady Statistic RV Percent Paced: 5.14 %
Date Time Interrogation Session: 20230829033622
HighPow Impedance: 43 Ohm
Implantable Lead Implant Date: 20221125
Implantable Lead Implant Date: 20221125
Implantable Lead Location: 753859
Implantable Lead Location: 753860
Implantable Lead Model: 5076
Implantable Lead Model: 6935
Implantable Pulse Generator Implant Date: 20221125
Lead Channel Impedance Value: 323 Ohm
Lead Channel Impedance Value: 380 Ohm
Lead Channel Impedance Value: 437 Ohm
Lead Channel Pacing Threshold Amplitude: 0.75 V
Lead Channel Pacing Threshold Amplitude: 0.875 V
Lead Channel Pacing Threshold Pulse Width: 0.4 ms
Lead Channel Pacing Threshold Pulse Width: 0.4 ms
Lead Channel Sensing Intrinsic Amplitude: 1.75 mV
Lead Channel Sensing Intrinsic Amplitude: 1.75 mV
Lead Channel Sensing Intrinsic Amplitude: 6.125 mV
Lead Channel Sensing Intrinsic Amplitude: 6.125 mV
Lead Channel Setting Pacing Amplitude: 1.75 V
Lead Channel Setting Pacing Amplitude: 2 V
Lead Channel Setting Pacing Pulse Width: 0.4 ms
Lead Channel Setting Sensing Sensitivity: 0.3 mV

## 2022-05-21 ENCOUNTER — Telehealth (HOSPITAL_COMMUNITY): Payer: Self-pay

## 2022-05-21 NOTE — Telephone Encounter (Signed)
Called and tried to leave a message with patient's daughter to confirm/remind patient of their appointment at the [facility] on 05/22/22 however, the phone line got disconnected.

## 2022-05-22 ENCOUNTER — Encounter (HOSPITAL_COMMUNITY): Payer: Self-pay

## 2022-05-22 NOTE — Progress Notes (Signed)
Remote ICD transmission.   

## 2022-05-26 ENCOUNTER — Telehealth (HOSPITAL_COMMUNITY): Payer: Self-pay

## 2022-05-26 NOTE — Telephone Encounter (Signed)
Called and spoke to patient's daughter Shwanda to confirm/remind patient of their appointment at the [facility] on 05/27/22.   Patient reminded to bring all medications and/or complete list.  Confirmed patient has transportation. Gave directions, instructed to utilize [facility] parking.  Confirmed appointment prior to ending call.

## 2022-05-27 ENCOUNTER — Encounter (HOSPITAL_COMMUNITY): Payer: Self-pay

## 2022-05-27 ENCOUNTER — Ambulatory Visit (HOSPITAL_COMMUNITY)
Admission: RE | Admit: 2022-05-27 | Discharge: 2022-05-27 | Disposition: A | Discharge status: Home / Self Care | Payer: Self-pay | Source: Ambulatory Visit | Attending: Family Medicine | Admitting: Family Medicine

## 2022-05-27 VITALS — BP 186/94 | HR 63 | Wt 127.8 lb

## 2022-05-27 DIAGNOSIS — I493 Ventricular premature depolarization: Secondary | ICD-10-CM | POA: Insufficient documentation

## 2022-05-27 DIAGNOSIS — M8580 Other specified disorders of bone density and structure, unspecified site: Secondary | ICD-10-CM | POA: Insufficient documentation

## 2022-05-27 DIAGNOSIS — Z8674 Personal history of sudden cardiac arrest: Secondary | ICD-10-CM | POA: Insufficient documentation

## 2022-05-27 DIAGNOSIS — I10 Essential (primary) hypertension: Secondary | ICD-10-CM

## 2022-05-27 DIAGNOSIS — N179 Acute kidney failure, unspecified: Secondary | ICD-10-CM | POA: Insufficient documentation

## 2022-05-27 DIAGNOSIS — I471 Supraventricular tachycardia: Secondary | ICD-10-CM | POA: Insufficient documentation

## 2022-05-27 DIAGNOSIS — I340 Nonrheumatic mitral (valve) insufficiency: Secondary | ICD-10-CM

## 2022-05-27 DIAGNOSIS — D869 Sarcoidosis, unspecified: Secondary | ICD-10-CM | POA: Insufficient documentation

## 2022-05-27 DIAGNOSIS — I5020 Unspecified systolic (congestive) heart failure: Secondary | ICD-10-CM

## 2022-05-27 DIAGNOSIS — L905 Scar conditions and fibrosis of skin: Secondary | ICD-10-CM | POA: Insufficient documentation

## 2022-05-27 DIAGNOSIS — I4901 Ventricular fibrillation: Secondary | ICD-10-CM | POA: Insufficient documentation

## 2022-05-27 DIAGNOSIS — I5022 Chronic systolic (congestive) heart failure: Secondary | ICD-10-CM | POA: Insufficient documentation

## 2022-05-27 DIAGNOSIS — I110 Hypertensive heart disease with heart failure: Secondary | ICD-10-CM | POA: Insufficient documentation

## 2022-05-27 DIAGNOSIS — Z79899 Other long term (current) drug therapy: Secondary | ICD-10-CM | POA: Insufficient documentation

## 2022-05-27 DIAGNOSIS — I469 Cardiac arrest, cause unspecified: Secondary | ICD-10-CM

## 2022-05-27 DIAGNOSIS — N3010 Interstitial cystitis (chronic) without hematuria: Secondary | ICD-10-CM | POA: Insufficient documentation

## 2022-05-27 LAB — COMPREHENSIVE METABOLIC PANEL
ALT: 17 U/L (ref 0–44)
AST: 35 U/L (ref 15–41)
Albumin: 3.8 g/dL (ref 3.5–5.0)
Alkaline Phosphatase: 134 U/L — ABNORMAL HIGH (ref 38–126)
Anion gap: 10 (ref 5–15)
BUN: 9 mg/dL (ref 8–23)
CO2: 24 mmol/L (ref 22–32)
Calcium: 8.9 mg/dL (ref 8.9–10.3)
Chloride: 108 mmol/L (ref 98–111)
Creatinine, Ser: 1.06 mg/dL — ABNORMAL HIGH (ref 0.44–1.00)
GFR, Estimated: 54 mL/min — ABNORMAL LOW (ref >60)
Glucose, Bld: 89 mg/dL (ref 70–99)
Potassium: 3.3 mmol/L — ABNORMAL LOW (ref 3.5–5.1)
Sodium: 142 mmol/L (ref 135–145)
Total Bilirubin: 0.7 mg/dL (ref 0.3–1.2)
Total Protein: 6.3 g/dL — ABNORMAL LOW (ref 6.5–8.1)

## 2022-05-27 LAB — BRAIN NATRIURETIC PEPTIDE: B Natriuretic Peptide: 295.1 pg/mL — ABNORMAL HIGH (ref 0.0–100.0)

## 2022-05-27 LAB — TSH: TSH: 0.776 u[IU]/mL (ref 0.350–4.500)

## 2022-05-27 MED ORDER — POTASSIUM CHLORIDE CRYS ER 20 MEQ PO TBCR: 20.0000 meq | EXTENDED_RELEASE_TABLET | Freq: Every day | ORAL | 6 refills | Status: DC

## 2022-05-27 MED ORDER — FUROSEMIDE 20 MG PO TABS: 20.0000 mg | ORAL_TABLET | Freq: Every day | ORAL | 6 refills | Status: DC

## 2022-05-27 NOTE — Progress Notes (Signed)
ADVANCED HF CLINIC NOTE  PCP: Nicholes Rough, PA-C Primary Cardiologist: Dr .Johnsie Cancel  HF Cardiologist: Dr. Haroldine Laws  Reason for visit: Follow-up for chronic systolic heart failure  HPI: Robin Estes is a 76 y.o. woman with a past medical history of interstitial cystitis, sarcoidosis (eye, lung, and skin), osteopenia, MAT, HTN, MR, pulmonary nodules on CT 10/3352, and systolic heart failure.  In July 2020 she was admitted to [facility] with increased shortness breath. EKG showed A fib/MAT. ECHO  EF 30-35%  and mod-severe MR.GDMT initiated   TEE 8/21 LVEF 40-45% w/ mild global HK with moderate to severe functional central MR.Marland Kitchen Referred to Dr. Burt Knack for consideration of [facility] but did not keep the appointment d/t gap in insurance coverage.  Seen in [facility] 8/22 and was doing well on GDMT with stable NYHA II symptoms. Sarcoid thought to be quiescent. Repeat echo ordered   She presented to [facility] 07/17/21 with cardiac arrest/Vfib (CPR time approx 5 minutes; 1 dose of epi; she was successfully shocked out of it). Post ROSC she had MAT/Afib, no signs of ST elevation, and soft BPs. Intubated, NE gtt started, and Cardiology consulted. Started on amio and lidocaine. Placed empirically on cardiac sarcoid protocol w/ steroids/ MTX. Echo 07/18/21 EF 35-40% Severe MR. She developed cardiogenic shock and was started on DBA and NE. R/LHC with no coronary disease, low filling pressures. High output on DBA + Noepi. CMRI showed LVEF 60% RV 60%. LGE basal septal midwall. Nonspecific scar. Needs cardiac PET.  She had MDT ICD placed. Marland Kitchen Discharged home on cardiac sarcoid treatment protocol w/ prednisone and MTX taper. Bactrim for PJP prophylaxis. Weight 136 lbs.  Midodrine stopped and arranged for cardiac PET at [facility] at post hospital follow up 12/22.  Echo 6/23 showed EF 50%, RV ok, moderate to severe MR with moderate MAC, moderate TR.  Today she returns for HF follow up with her daughter. Overall feeling fine. She is not SOB  walking on flat ground or getting around the house. Does not do lifting. Denies palpitations, abnormal bleeding, CP, dizziness, edema, or PND/Orthopnea. Appetite ok. No fever or chills. Weight at home 130-132 pounds. Not taking medications consistently. Lives with her husband.  Cardiac studies  - Echo (6/23): EF 50%, RV ok, moderate to severe MR with moderate MAC, moderate TR.  - R/LHC (11/22): On NE 3 and DBA 5   Ao =  118/41 (67) LV =  121/9 RA =  2 RV = 52/4 PA = 56/6 (24) PCW = 7 Fick cardiac output/index = 11.2/6.6 PVR = 1.5 WU SVR = 463 FA sat = 99% PA sat = 82%, 82% SVC sat = 64%   Assessment: 1. Normal coronary arteries 2. EF 45% by v-gram 3. High cardiac output with low SVR on DBA 5 and NE 3. No evidence of intracardiac shunting.   - Echo (11/22): EF 35-40% Severe MR  - CMRI (11/22): LV EF 60% RV 60%. LVEF 60% RV 60% . LGE basal septal midwall. Nonspecific scar. Moderate MR  - Echo (12/20): EF ~45% MV appears thickened and possible rheumatic with 3+ MR. Severe LAE. Mod TR. Personally reviewed  - Echo (7/20):  30-35% , RV normal, MV mod-severe MR.   - Myoview (7/20): no ischemia, moderate LV dysfunction 30%   - TEE (8/21): moderate to severe functional central MR. LVEF 40-45% w/ mild global HK.  - cMRI 04/24/19:  1.  Hilar lymphadenopathy consistent with sarcoidosis diagnosis.   2. Mildly dilated LV with EF 45%,  there was mild diffuse hypokinesis with severe mid inferior hypokinesis.   3.  Normal RV size and systolic function, EF 25%.   4. Mitral regurgitation appeared severe (flow sequences to quantify were not done).   5. Possible subtle mid-wall LGE in the mid inferior wall (not seen on long axis views). This could be consistent with cardiac sarcoidosis. This is not consistent with prior MI.  Review of systems complete and found to be negative unless listed in HPI.  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 90 tablet 3   atorvastatin (LIPITOR) 20 MG tablet Take 1 tablet (20 mg total) by mouth daily at 6 PM. 30 tablet 11   calcium carbonate (TUMS - DOSED IN MG ELEMENTAL CALCIUM) 500 MG chewable tablet Chew 1 tablet by mouth daily.     donepezil (ARICEPT) 10 MG tablet Take 10 mg by mouth at bedtime.     folic acid (FOLVITE) 1 MG tablet Take 1 tablet (1 mg total) by mouth daily. 30 tablet 3   furosemide (LASIX) 20 MG tablet Take 1 tablet (20 mg total) by mouth daily as needed. 30 tablet 3   guaiFENesin (MUCINEX) 600 MG 12 hr tablet Take 600 mg by mouth daily.     methotrexate (RHEUMATREX) 2.5 MG tablet Take 8 tablets (20 mg total) by mouth every Wednesday. 96 tablet 3   Multiple Vitamins-Minerals (MULTIVITAMIN WITH MINERALS) tablet Take 1 tablet by mouth daily.     spironolactone (ALDACTONE) 25 MG tablet Take 25 mg by mouth daily.     No current facility-administered medications for this encounter.   Allergies  Allergen Reactions   Cinnamon Other (See Comments)    MOUTH ULCERS   Farxiga [Dapagliflozin] Other (See Comments)    Skin infection   Social History   Socioeconomic History   Marital status:  Married    Spouse name: Not on file   Number of children: Not on file   Years of education: Not on file   Highest education level: Not on file  Occupational History   Not on file  Tobacco Use   Smoking status: Never   Smokeless tobacco: Never  Substance and Sexual Activity   Alcohol use: No   Drug use: No   Sexual activity: Not on file  Other Topics Concern   Not on file  Social History Narrative   Not on file   Social Determinants of Health   Financial Resource Strain: High Risk (04/23/2021)   Overall Financial Resource Strain (CARDIA)    Difficulty of Paying  Living Expenses: Hard  Food Insecurity: No Food Insecurity (04/23/2021)   Hunger Vital Sign    Worried About Running Out of Food in the Last Year: Never true    Ran Out of Food in the Last Year: Never true  Transportation Needs: No Transportation Needs (04/23/2021)   PRAPARE - Hydrologist (Medical): No    Lack of Transportation (Non-Medical): No  Physical Activity: Not on file  Stress: Not on file  Social Connections: Not on file  Intimate Partner Violence: Not on file   Family History  Problem Relation Age of Onset   CAD Father    Heart attack Father    Sarcoidosis Other    Healthy Sister    Hyperlipidemia Sister    Healthy Sister    [place] Readings from Last 3 Encounters:  05/27/22 58 kg (127 lb 12.8 oz)  02/19/22 59.9 kg (132 lb)  09/16/21 59.6 kg (131 lb 6.4 oz)   BP (!) 186/94   Pulse 63   Wt 58 kg (127 lb 12.8 oz)   SpO2 100%   BMI 21.94 kg/m   Physical Exam: General:  NAD. No resp difficulty, + skin changes c/w sarcoidosis. HEENT: Normal Neck: Supple. + v waves, JVP to jaw. Carotids 2+ bilat; no bruits. No lymphadenopathy or thryomegaly appreciated. Cor: PMI nondisplaced. Regular rate & rhythm. No rubs, gallops, 3/6 MR Lungs: Clear, faint crackles RLL Abdomen: Soft, nontender, nondistended. No hepatosplenomegaly. No bruits or masses. Good bowel sounds. Extremities: No  cyanosis, clubbing, rash, edema Neuro: Alert & oriented x 3, cranial nerves grossly intact. Moves all 4 extremities w/o difficulty. Affect pleasant. Appears to have some component of memory impairment.  Device interrogation: OptiVol up, thoracic impedence down, no VT, activity level 1-2hr/day (personally reviewed)  REDs: 42%  ASSESSMENT & PLAN: 1. Chronic Systolic Heart Failure - cMRI (8/20): with EF 45%. RV normal. Minimal LGE -> doubt significant cardiac sarcoid - Echo (12/20): EF ~45% MV appears thickened and possible rheumatic with 3+ MR. Severe LAE. Mod TR.  - TEE (8/21): LVEF 40-45% w/ mild global HK moderate to severe functional central MR. - Echo (11/22) EF 35-40% Severe MR - cMRI (11/22):  LV EF 60% RV 60%. LVEF 60% RV 60% . LGE basal septal midwall. Nonspecific scar. Recommending cardiac PET but she is uninsured.   - Echo (6/23): EF 50%, RV ok, moderate to severe MR with moderate MAC, moderate TR. - Doing well NYHA II. Volume status up on exam and REDs 42%. Suspect due to med noncompliance. - Start Lasix 20 mg daily + 20 KCL daily. - Continue spironolactone 25 mg daily. Stressed importance of medication compliance. - Off Entresto with AKI - Unable to tolerate [place]. - Check labs today.  2. 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 Loanne Drilling in Pulmonary.  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  - cMRI EF 60% normal RV. LGE basal septal midwall. Nonspecific scar.   - Now on [facility] Immunosuppressive Therapy for Cardiac Sarcoid  - Completed prednisone. - Continue methotrexate 20 mg every Wednesday - Continue folic acid 1 mg daily.  - Unable to get Cardiac PET at [facility] w/o insurance (see below). - Labs today.  3. 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 cMRI with LGE possible sarcoid. Need cardiac PET.  - s/p MDT ICD.  -  Continue amio  200 mg daily. - Continue MTX protocol.  - ICD interrogated No VT - Check amio labs today.   4. MAT - Zio in 9/20 mostly SR, some PACs (4.7%), rare PVCs (<1.0%), 22 runs of SVT.  - No AT on device interrogation today.   5. 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 - Severe MR on echo, but moderate on cMRI (11/22) - She had been referred for possible [facility]. Did not keep appointment due to gap in insurance coverage - Echo (6/23) moderate to severe, mean valve gradient 2.5 mmHg, MV area 2.50 cm. Personally reviewed with Dr. Haroldine Laws, consider TEE to further evaluate. May be too thick for MitraClip.  6. HTN - BP elevated today, has not had meds yet. - Diurese as above. - Discussed importance of taking medicaitons. - Consider adding ARB/ARNi next if renal function stable.  Follow up in 3 weeks with APP and 3 months with Dr. Haroldine Laws  Robin Bihari, FNP  3:08 PM

## 2022-05-27 NOTE — Patient Instructions (Signed)
Medication Changes:  START Furosemide 20 mg Daily  START Potassium 20 meq Daily  Lab Work:  Labs done today, your results will be available in MyChart, we will contact you for abnormal readings.  Testing/Procedures:  none  Referrals:  none  Special Instructions // Education:  Do the following things EVERYDAY: Weigh yourself in the morning before breakfast. Write it down and keep it in a log. Take your medicines as prescribed Eat low salt foods--Limit salt (sodium) to 2000 mg per day.  Stay as active as you can everyday Limit all fluids for the day to less than 2 liters   Follow-Up in: 3 weeks and again in 3-4 months with Dr Haroldine Laws (**PLEASE CALL OUR OFFICE IN NOVEMBER TO SCHEDULE THIS APPOINTMENT  At the [facility], you and your health needs are our priority. We have a designated team specialized in the treatment of Heart Failure. This Care Team includes your primary Heart Failure Specialized Cardiologist (physician), Advanced Practice Providers (APPs- Physician Assistants and Nurse Practitioners), and Pharmacist who all work together to provide you with the care you need, when you need it.   You may see any of the following providers on your designated Care Team at your next follow up:  Dr. Glori Bickers Dr. Loralie Champagne Dr. Roxana Hires, NP Lyda Jester, [place] [facility] [place], [place] Forestine Na, NP Audry Riles, PharmD   Please be sure to bring in all your medications bottles to every appointment.   Need to Contact us:  If you have any questions or concerns before your next appointment please send [place] a message through [place] or call our office at [phone].    TO LEAVE A MESSAGE FOR THE NURSE SELECT OPTION 2, PLEASE LEAVE A MESSAGE INCLUDING: YOUR NAME DATE OF BIRTH CALL BACK NUMBER REASON FOR CALL**this is important as we prioritize the call backs  YOU WILL RECEIVE A CALL BACK THE SAME DAY AS LONG AS  YOU CALL BEFORE 4:00 PM

## 2022-05-27 NOTE — Progress Notes (Signed)
ReDS Vest / Clip - 05/27/22 1500       ReDS Vest / Clip   Station Marker A    Ruler Value 19    ReDS Value Range High volume overload    ReDS Actual Value 42

## 2022-06-08 ENCOUNTER — Telehealth (HOSPITAL_COMMUNITY): Payer: Self-pay | Admitting: *Deleted

## 2022-06-08 DIAGNOSIS — I5020 Unspecified systolic (congestive) heart failure: Secondary | ICD-10-CM

## 2022-06-08 MED ORDER — FUROSEMIDE 20 MG PO TABS: 20.0000 mg | ORAL_TABLET | ORAL | 6 refills | Status: DC | PRN

## 2022-06-08 NOTE — Telephone Encounter (Signed)
Called patient per Robin Katz, NP. Patient asked that we call her daughter with medication changes. Called daughter with following: 1. K is low. She has been missing doses of her spiro, needs to make sure she takes this daily.  2. Take extra 40 KCL x 1, then continue with Lasix 20 mg daily + 20 KCL daily.  3.  Repeat labs in 7-10 days   Daughter confirms she is taking spiro daily. Daughter reports she had orthostatic dizziness this am - resolved with bottle of Gatorade. Husband reported she has been having dizziness over last several days. Daughter asked if we could decrease Lasix back to prn status. Reviewed with Ellen Henri, PA and that change was approved.  Asked that pt take extra potassium 40 meq today. Repeat labs in 7 - 10 days; daughter says patient has f/u with [place] next week. Daughter verbalized understanding of above.

## 2022-06-09 ENCOUNTER — Encounter: Payer: Self-pay | Admitting: Internal Medicine

## 2022-06-16 ENCOUNTER — Telehealth (HOSPITAL_COMMUNITY): Payer: Self-pay

## 2022-06-16 NOTE — Telephone Encounter (Signed)
Called and spoke to patient husband to confirm/remind patient of their appointment at the [facility] on 06/17/28.   Patient reminded to bring all medications and/or complete list.  Confirmed patient has transportation. Gave directions, instructed to utilize [place] parking.  Confirmed appointment prior to ending call.

## 2022-06-17 ENCOUNTER — Encounter (HOSPITAL_COMMUNITY): Payer: Self-pay

## 2022-07-02 ENCOUNTER — Telehealth (HOSPITAL_COMMUNITY): Payer: Self-pay

## 2022-07-02 NOTE — Telephone Encounter (Signed)
Called to confirm/remind patient of their appointment [facility] on [facility].   Patient reminded to bring all medications and/or complete list.  Confirmed patient has transportation. Gave directions, instructed to utilize valet parking.  Confirmed appointment prior to ending call.   

## 2022-07-03 ENCOUNTER — Encounter (HOSPITAL_COMMUNITY): Payer: Self-pay

## 2022-07-03 ENCOUNTER — Ambulatory Visit (HOSPITAL_COMMUNITY)
Admission: RE | Admit: 2022-07-03 | Discharge: 2022-07-03 | Disposition: A | Discharge status: Home / Self Care | Payer: Self-pay | Source: Ambulatory Visit | Attending: Family Medicine | Admitting: Family Medicine

## 2022-07-03 VITALS — BP 144/86 | HR 65 | Wt 123.0 lb

## 2022-07-03 DIAGNOSIS — R4189 Other symptoms and signs involving cognitive functions and awareness: Secondary | ICD-10-CM | POA: Insufficient documentation

## 2022-07-03 DIAGNOSIS — I10 Essential (primary) hypertension: Secondary | ICD-10-CM

## 2022-07-03 DIAGNOSIS — I472 Ventricular tachycardia, unspecified: Secondary | ICD-10-CM

## 2022-07-03 DIAGNOSIS — Z79899 Other long term (current) drug therapy: Secondary | ICD-10-CM | POA: Insufficient documentation

## 2022-07-03 DIAGNOSIS — Z79631 Long term (current) use of antimetabolite agent: Secondary | ICD-10-CM | POA: Insufficient documentation

## 2022-07-03 DIAGNOSIS — D869 Sarcoidosis, unspecified: Secondary | ICD-10-CM | POA: Insufficient documentation

## 2022-07-03 DIAGNOSIS — I110 Hypertensive heart disease with heart failure: Secondary | ICD-10-CM | POA: Insufficient documentation

## 2022-07-03 DIAGNOSIS — Z139 Encounter for screening, unspecified: Secondary | ICD-10-CM

## 2022-07-03 DIAGNOSIS — Z95810 Presence of automatic (implantable) cardiac defibrillator: Secondary | ICD-10-CM | POA: Insufficient documentation

## 2022-07-03 DIAGNOSIS — I5042 Chronic combined systolic (congestive) and diastolic (congestive) heart failure: Secondary | ICD-10-CM | POA: Insufficient documentation

## 2022-07-03 DIAGNOSIS — I4719 Other supraventricular tachycardia: Secondary | ICD-10-CM

## 2022-07-03 DIAGNOSIS — Z8674 Personal history of sudden cardiac arrest: Secondary | ICD-10-CM | POA: Insufficient documentation

## 2022-07-03 DIAGNOSIS — Z5986 Financial insecurity: Secondary | ICD-10-CM | POA: Insufficient documentation

## 2022-07-03 DIAGNOSIS — I340 Nonrheumatic mitral (valve) insufficiency: Secondary | ICD-10-CM | POA: Insufficient documentation

## 2022-07-03 LAB — CBC
HCT: 36 % (ref 36.0–46.0)
Hemoglobin: 11.9 g/dL — ABNORMAL LOW (ref 12.0–15.0)
MCH: 31 pg (ref 26.0–34.0)
MCHC: 33.1 g/dL (ref 30.0–36.0)
MCV: 93.8 fL (ref 80.0–100.0)
Platelets: 277 10*3/uL (ref 150–400)
RBC: 3.84 MIL/uL — ABNORMAL LOW (ref 3.87–5.11)
RDW: 15.1 % (ref 11.5–15.5)
WBC: 4.6 10*3/uL (ref 4.0–10.5)
nRBC: 0 % (ref 0.0–0.2)

## 2022-07-03 LAB — BASIC METABOLIC PANEL
Anion gap: 8 (ref 5–15)
BUN: 19 mg/dL (ref 8–23)
CO2: 24 mmol/L (ref 22–32)
Calcium: 9.4 mg/dL (ref 8.9–10.3)
Chloride: 107 mmol/L (ref 98–111)
Creatinine, Ser: 1.37 mg/dL — ABNORMAL HIGH (ref 0.44–1.00)
GFR, Estimated: 40 mL/min — ABNORMAL LOW (ref >60)
Glucose, Bld: 85 mg/dL (ref 70–99)
Potassium: 4.7 mmol/L (ref 3.5–5.1)
Sodium: 139 mmol/L (ref 135–145)

## 2022-07-03 LAB — BRAIN NATRIURETIC PEPTIDE: B Natriuretic Peptide: 46.8 pg/mL (ref 0.0–100.0)

## 2022-07-03 NOTE — Progress Notes (Signed)
H&V Care Navigation CSW Progress Note  Clinical Social Worker consulted to discuss current lack of insurance.  Pt is 76yo and only has Medicare part A.  CSW spoke with pt and dtr about signing up for part B and D to help with outpatient follow up and medication costs. CSW sent message to [facility] counselor to follow up with patient dtr regarding getting benefits.  SDOH Screenings   Food Insecurity: No Food Insecurity (04/23/2021)  Housing: Low Risk  (04/23/2021)  Transportation Needs: No Transportation Needs (04/23/2021)  Financial Resource Strain: High Risk (04/23/2021)  Tobacco Use: Low Risk  (07/03/2022)   Jorge Ny, [place] Desk#: [phone] Cell#: [phone]

## 2022-07-03 NOTE — Addendum Note (Signed)
Encounter addended by: Shonna Chock, CMA on: 04/07/1102 3:03 PM  Actions taken: Charge Capture section accepted

## 2022-07-03 NOTE — Progress Notes (Signed)
Advanced Heart Failure Clinic Note  PCP: Nicholes Rough, PA-C Primary Cardiologist: Dr. Johnsie Cancel  HF Cardiologist: Dr. Haroldine Laws  Reason for visit: Follow-up for chronic systolic heart failure  HPI: Robin Estes is a 76 y.o. woman with a past medical history of interstitial cystitis, sarcoidosis (eye, lung, and skin), osteopenia, MAT, HTN, MR, pulmonary nodules on CT 10/6331, and systolic heart failure.  In July 2020 she was admitted to [facility] with increased shortness breath. EKG showed A fib/MAT. ECHO  EF 30-35%  and mod-severe MR.GDMT initiated   TEE 8/21 LVEF 40-45% w/ mild global HK with moderate to severe functional central MR.Marland Kitchen Referred to Dr. Burt Knack for consideration of [facility] but did not keep the appointment d/t gap in insurance coverage.  Seen in [facility] 8/22 and was doing well on GDMT with stable NYHA II symptoms. Sarcoid thought to be quiescent. Repeat echo ordered   She presented to [facility] 07/17/21 with cardiac arrest/Vfib (CPR time approx 5 minutes; 1 dose of epi; she was successfully shocked out of it). Post ROSC she had MAT/Afib, no signs of ST elevation, and soft BPs. Intubated, NE gtt started, and Cardiology consulted. Started on amio and lidocaine. Placed empirically on cardiac sarcoid protocol w/ steroids/ MTX. Echo 07/18/21 EF 35-40% Severe MR. She developed cardiogenic shock and was started on DBA and NE. R/LHC with no coronary disease, low filling pressures. High output on DBA + Noepi. CMRI showed LVEF 60% RV 60%. LGE basal septal midwall. Nonspecific scar. Needs cardiac PET.  She had MDT ICD placed. Marland Kitchen Discharged home on cardiac sarcoid treatment protocol w/ prednisone and MTX taper. Bactrim for PJP prophylaxis. Weight 136 lbs.  Midodrine stopped and arranged for cardiac PET at [facility] at post hospital follow up 12/22.  Echo 6/23 showed EF 50%, RV ok, moderate to severe MR with moderate MAC, moderate TR.  Follow up 9/23, not taking medications consistently and volume up. Lasix 20 mg  daily + 20 KCL daily started.   Today she returns for HF follow up. Overall feeling better on Lasix MWF. She lives with her husband, daughter says appetite and short term memory continue to decline. She is not SOB with ADLs or walking on flat ground. Denies palpitations, abnormal bleeding, CP, dizziness, edema, or PND/Orthopnea. Appetite poor. No fever or chills. She does not weigh at home. Taking all medications consistently with family's help. Remains uninsured.  Cardiac studies  - Echo (6/23): EF 50%, RV ok, moderate to severe MR with moderate MAC, moderate TR.  - R/LHC (11/22): On NE 3 and DBA 5   Ao =  118/41 (67) LV =  121/9 RA =  2 RV = 52/4 PA = 56/6 (24) PCW = 7 Fick cardiac output/index = 11.2/6.6 PVR = 1.5 WU SVR = 463 FA sat = 99% PA sat = 82%, 82% SVC sat = 64%   Assessment: 1. Normal coronary arteries 2. EF 45% by v-gram 3. High cardiac output with low SVR on DBA 5 and NE 3. No evidence of intracardiac shunting.   - Echo (11/22): EF 35-40% Severe MR  - CMRI (11/22): LV EF 60% RV 60%. LVEF 60% RV 60% . LGE basal septal midwall. Nonspecific scar. Moderate MR  - Echo (12/20): EF ~45% MV appears thickened and possible rheumatic with 3+ MR. Severe LAE. Mod TR. Personally reviewed  - Echo (7/20):  30-35% , RV normal, MV mod-severe MR.   - Myoview (7/20): no ischemia, moderate LV dysfunction 30%   - TEE (8/21): moderate  to severe functional central MR. LVEF 40-45% w/ mild global HK.  - cMRI 04/24/19:  1.  Hilar lymphadenopathy consistent with sarcoidosis diagnosis.   2. Mildly dilated LV with EF 45%, there was mild diffuse hypokinesis with severe mid inferior hypokinesis.   3.  Normal RV size and systolic function, EF 11%.   4. Mitral regurgitation appeared severe (flow sequences to quantify were not done).   5. Possible subtle mid-wall LGE in the mid inferior wall (not seen on long axis views). This could be consistent with cardiac sarcoidosis. This is not  consistent with prior MI.  Review of systems complete and found to be negative unless listed in HPI.  Past Medical History:  Diagnosis Date   Bladder spasms    Cardiomegaly 03/15/2019   CHF (congestive heart failure) (HCC)    Chronic interstitial cystitis    Combined systolic and diastolic heart failure (HCC)    Dysuria    Elevated troponin 03/15/2019   End-stage glaucoma    RIGHT EYE   Frequency of urination    Legally blind in right eye, as defined in [place]    SECONDARY TO GLAUCOMA   Lymphadenopathy, hilar 03/15/2019   Multifocal atrial tachycardia    Nocturia    Pleural effusion 03/15/2019   Prolonged QT interval 03/15/2019   Pulmonary sarcoidosis ([place]) 07/09/2019   Sarcoidosis 03/15/2019   Sarcoidosis of skin    Sensation of pressure in bladder area    Solitary pulmonary nodule 07/09/2019   Urgency of urination    Current Outpatient Medications  Medication Sig Dispense Refill   amiodarone (PACERONE) 200 MG tablet Take 1 tablet (200 mg total) by mouth daily. 90 tablet 3   atorvastatin (LIPITOR) 20 MG tablet Take 1 tablet (20 mg total) by mouth daily at 6 PM. 30 tablet 11   calcium carbonate (TUMS - DOSED IN MG ELEMENTAL CALCIUM) 500 MG chewable tablet Chew 1 tablet by mouth daily.     donepezil (ARICEPT) 10 MG tablet Take 10 mg by mouth at bedtime.     folic acid (FOLVITE) 1 MG tablet Take 1 tablet (1 mg total) by mouth daily. 30 tablet 3   furosemide (LASIX) 20 MG tablet Take 1 tablet (20 mg total) by mouth as needed. 30 tablet 6   guaiFENesin (MUCINEX) 600 MG 12 hr tablet Take 600 mg by mouth daily.     methotrexate (RHEUMATREX) 2.5 MG tablet Take 8 tablets (20 mg total) by mouth every Wednesday. 96 tablet 3   Multiple Vitamins-Minerals (MULTIVITAMIN WITH MINERALS) tablet Take 1 tablet by mouth daily.     potassium chloride SA (KLOR-CON M) 20 MEQ tablet Take 1 tablet (20 mEq total) by mouth daily. 30 tablet 6   spironolactone (ALDACTONE) 25 MG tablet Take 25 mg by mouth daily.      No current facility-administered medications for this encounter.   Allergies  Allergen Reactions   Cinnamon Other (See Comments)    MOUTH ULCERS   Farxiga [Dapagliflozin] Other (See Comments)    Skin infection   Social History   Socioeconomic History   Marital status: Married    Spouse name: Not on file   Number of children: Not on file   Years of education: Not on file   Highest education level: Not on file  Occupational History   Not on file  Tobacco Use   Smoking status: Never   Smokeless tobacco: Never  Substance and Sexual Activity   Alcohol use: No   Drug use: No  Sexual activity: Not on file  Other Topics Concern   Not on file  Social History Narrative   Not on file   Social Determinants of Health   Financial Resource Strain: High Risk (04/23/2021)   Overall Financial Resource Strain (CARDIA)    Difficulty of Paying Living Expenses: Hard  Food Insecurity: No Food Insecurity (04/23/2021)   Hunger Vital Sign    Worried About Running Out of Food in the Last Year: Never true    Ran Out of Food in the Last Year: Never true  Transportation Needs: No Transportation Needs (04/23/2021)   PRAPARE - Hydrologist (Medical): No    Lack of Transportation (Non-Medical): No  Physical Activity: Not on file  Stress: Not on file  Social Connections: Not on file  Intimate Partner Violence: Not on file   Family History  Problem Relation Age of Onset   CAD Father    Heart attack Father    Sarcoidosis Other    Healthy Sister    Hyperlipidemia Sister    Healthy Sister    [place] Readings from Last 3 Encounters:  07/03/22 55.8 kg (123 lb)  05/27/22 58 kg (127 lb 12.8 oz)  02/19/22 59.9 kg (132 lb)   BP (!) 144/86   Pulse 65   Wt 55.8 kg (123 lb)   SpO2 98%   BMI 21.11 kg/m   Physical Exam: General:  NAD. No resp difficulty, walked into clinic HEENT: Normal Neck: Supple. No JVD, +v waves. Carotids 2+ bilat; no bruits. No lymphadenopathy  or thryomegaly appreciated. Cor: PMI nondisplaced. Regular rate & rhythm. No rubs, gallops, 3/6 MR Lungs: Clear Abdomen: Soft, nontender, nondistended. No hepatosplenomegaly. No bruits or masses. Good bowel sounds. Extremities: No cyanosis, clubbing, rash, edema Neuro: Alert & oriented x 3, cranial nerves grossly intact. Moves all 4 extremities w/o difficulty. Affect pleasant.  Device interrogation: OptiVol down, stable thoracic impedence, no VT, activity level 2hr/day (personally reviewed)  ECG (personally reviewed): NSR 63 bpm  ASSESSMENT & PLAN: 1. Chronic Systolic Heart Failure - cMRI (8/20): with EF 45%. RV normal. Minimal LGE -> doubt significant cardiac sarcoid - Echo (12/20): EF ~45% MV appears thickened and possible rheumatic with 3+ MR. Severe LAE. Mod TR.  - TEE (8/21): LVEF 40-45% w/ mild global HK moderate to severe functional central MR. - Echo (11/22) EF 35-40% Severe MR - cMRI (11/22):  LV EF 60% RV 60%. LVEF 60% RV 60% . LGE basal septal midwall. Nonspecific scar. Recommending cardiac PET but she is uninsured.   - Echo (6/23): EF 50%, RV ok, moderate to severe MR with moderate MAC, moderate TR. - Doing well from HF standpoint, NYHA II. Volume looks good today. - Continue Lasix 20 mg MWF + 10 KCL on Lasix days - Continue spironolactone 25 mg daily.  - Off Entresto with AKI. May be able to re-challenge in future. Will hold off today. - Unable to tolerate [place]. - Check labs today.  2. 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 Loanne Drilling in Pulmonary.  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  - cMRI EF 60% normal RV. LGE basal septal midwall. Nonspecific scar.   - Now on [facility] Immunosuppressive Therapy for Cardiac Sarcoid  - Completed prednisone. - Continue methotrexate 20 mg every Wednesday. - Continue folic acid 1 mg daily.  - Unable to get Cardiac PET at [facility] w/o insurance (see below). - Labs today.  3. 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 cMRI with LGE possible sarcoid. Need cardiac PET.  - s/p MDT ICD.  - Continue amio 200 mg daily. Amio labs ok (9/23) - Continue MTX protocol.  - ICD interrogated No VT   4. MAT - Zio in 9/20 mostly SR, some PACs (4.7%), rare PVCs (<1.0%), 22 runs of SVT.  - No AT on device interrogation today. - NSR on ECG today.   5. 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 - Severe MR on echo, but moderate on cMRI (11/22) - She had been referred for possible [facility]. Did not keep appointment due to gap in insurance coverage - Echo (6/23) moderate to severe, mean valve gradient 2.5 mmHg, MV area 2.50 cm. Personally reviewed with Dr. Haroldine Laws, consider TEE to further evaluate. May be too thick for MitraClip.  6. HTN - BP elevated today, has not had meds yet. - Consider adding ARB/ARNi next if renal function stable.  7. Cognitive decline - On Aricept, per PCP - Refer to Neurology,  8. SDOH - Engage HFSW to help with insurance.  Follow up in 3 months with Dr. Haroldine Laws.  Robin Bihari, FNP  11:44 AM

## 2022-07-03 NOTE — Patient Instructions (Addendum)
EKG done today.  Labs done today. We will contact you only if your labs are abnormal.  No medication changes were made. Please continue all current medications as prescribed.  You have been referred to Neurology. They will contact you to schedule an appointment.   Your physician recommends that you schedule a follow-up appointment in: 3 months with Dr. Haroldine Laws. Please contact our office in December to schedule a February appointment.   If you have any questions or concerns before your next appointment please send [place] a message through [place] or call our office at [phone].    TO LEAVE A MESSAGE FOR THE NURSE SELECT OPTION 2, PLEASE LEAVE A MESSAGE INCLUDING: YOUR NAME DATE OF BIRTH CALL BACK NUMBER REASON FOR CALL**this is important as we prioritize the call backs  YOU WILL RECEIVE A CALL BACK THE SAME DAY AS LONG AS YOU CALL BEFORE 4:00 PM   Do the following things EVERYDAY: Weigh yourself in the morning before breakfast. Write it down and keep it in a log. Take your medicines as prescribed Eat low salt foods--Limit salt (sodium) to 2000 mg per day.  Stay as active as you can everyday Limit all fluids for the day to less than 2 liters   At the [facility], you and your health needs are our priority. As part of our continuing mission to provide you with exceptional heart care, we have created designated Provider Care Teams. These Care Teams include your primary Cardiologist (physician) and Advanced Practice Providers (APPs- Physician Assistants and Nurse Practitioners) who all work together to provide you with the care you need, when you need it.   You may see any of the following providers on your designated Care Team at your next follow up: Dr Glori Bickers Dr Haynes Kerns, NP Lyda Jester, [place] Audry Riles, PharmD   Please be sure to bring in all your medications bottles to every appointment.

## 2022-07-14 ENCOUNTER — Encounter: Payer: Self-pay | Admitting: Neurology

## 2022-07-14 ENCOUNTER — Telehealth: Payer: Self-pay | Admitting: Neurology

## 2022-07-14 NOTE — Telephone Encounter (Signed)
Called pt to reschedule 12/14 appointment, no VM box or mychart. Sent letter to inform pt of need to reschedule appointment due to MD being out

## 2022-07-28 ENCOUNTER — Ambulatory Visit (INDEPENDENT_AMBULATORY_CARE_PROVIDER_SITE_OTHER): Payer: Self-pay

## 2022-07-28 DIAGNOSIS — I469 Cardiac arrest, cause unspecified: Secondary | ICD-10-CM

## 2022-07-28 DIAGNOSIS — I4901 Ventricular fibrillation: Secondary | ICD-10-CM

## 2022-07-28 LAB — CUP PACEART REMOTE DEVICE CHECK
Battery Remaining Longevity: 116 mo
Battery Voltage: 3.03 V
Brady Statistic AP VP Percent: 28.17 %
Brady Statistic AP VS Percent: 26.82 %
Brady Statistic AS VP Percent: 0.02 %
Brady Statistic AS VS Percent: 44.99 %
Brady Statistic RA Percent Paced: 54.62 %
Brady Statistic RV Percent Paced: 28.02 %
Date Time Interrogation Session: 20231128001702
HighPow Impedance: 57 Ohm
Implantable Lead Connection Status: 753985
Implantable Lead Connection Status: 753985
Implantable Lead Implant Date: 20221125
Implantable Lead Implant Date: 20221125
Implantable Lead Location: 753859
Implantable Lead Location: 753860
Implantable Lead Model: 5076
Implantable Lead Model: 6935
Implantable Pulse Generator Implant Date: 20221125
Lead Channel Impedance Value: 342 Ohm
Lead Channel Impedance Value: 437 Ohm
Lead Channel Impedance Value: 456 Ohm
Lead Channel Pacing Threshold Amplitude: 0.625 V
Lead Channel Pacing Threshold Amplitude: 0.75 V
Lead Channel Pacing Threshold Pulse Width: 0.4 ms
Lead Channel Pacing Threshold Pulse Width: 0.4 ms
Lead Channel Sensing Intrinsic Amplitude: 2.25 mV
Lead Channel Sensing Intrinsic Amplitude: 2.25 mV
Lead Channel Sensing Intrinsic Amplitude: 6.25 mV
Lead Channel Sensing Intrinsic Amplitude: 6.25 mV
Lead Channel Setting Pacing Amplitude: 1.5 V
Lead Channel Setting Pacing Amplitude: 2 V
Lead Channel Setting Pacing Pulse Width: 0.4 ms
Lead Channel Setting Sensing Sensitivity: 0.3 mV
Zone Setting Status: 755011

## 2022-08-01 ENCOUNTER — Other Ambulatory Visit (HOSPITAL_COMMUNITY): Payer: Self-pay | Admitting: Internal Medicine

## 2022-08-13 ENCOUNTER — Ambulatory Visit: Payer: Self-pay | Admitting: Neurology

## 2022-08-26 NOTE — Progress Notes (Signed)
Remote ICD transmission.   

## 2022-09-12 ENCOUNTER — Other Ambulatory Visit (HOSPITAL_COMMUNITY): Payer: Self-pay | Admitting: Internal Medicine

## 2022-10-07 ENCOUNTER — Other Ambulatory Visit (HOSPITAL_COMMUNITY): Payer: Self-pay | Admitting: Internal Medicine

## 2022-10-27 ENCOUNTER — Ambulatory Visit: Payer: Self-pay

## 2022-10-27 DIAGNOSIS — I472 Ventricular tachycardia, unspecified: Secondary | ICD-10-CM

## 2022-10-27 LAB — CUP PACEART REMOTE DEVICE CHECK
Battery Remaining Longevity: 110 mo
Battery Voltage: 3.03 V
Brady Statistic AP VP Percent: 39.95 %
Brady Statistic AP VS Percent: 22.47 %
Brady Statistic AS VP Percent: 0.02 %
Brady Statistic AS VS Percent: 37.55 %
Brady Statistic RA Percent Paced: 61.77 %
Brady Statistic RV Percent Paced: 39.46 %
Date Time Interrogation Session: 20240227022605
HighPow Impedance: 56 Ohm
Implantable Lead Connection Status: 753985
Implantable Lead Connection Status: 753985
Implantable Lead Implant Date: 20221125
Implantable Lead Implant Date: 20221125
Implantable Lead Location: 753859
Implantable Lead Location: 753860
Implantable Lead Model: 5076
Implantable Lead Model: 6935
Implantable Pulse Generator Implant Date: 20221125
Lead Channel Impedance Value: 323 Ohm
Lead Channel Impedance Value: 399 Ohm
Lead Channel Impedance Value: 456 Ohm
Lead Channel Pacing Threshold Amplitude: 0.625 V
Lead Channel Pacing Threshold Amplitude: 0.875 V
Lead Channel Pacing Threshold Pulse Width: 0.4 ms
Lead Channel Pacing Threshold Pulse Width: 0.4 ms
Lead Channel Sensing Intrinsic Amplitude: 2 mV
Lead Channel Sensing Intrinsic Amplitude: 2 mV
Lead Channel Sensing Intrinsic Amplitude: 6.25 mV
Lead Channel Sensing Intrinsic Amplitude: 6.25 mV
Lead Channel Setting Pacing Amplitude: 1.5 V
Lead Channel Setting Pacing Amplitude: 2 V
Lead Channel Setting Pacing Pulse Width: 0.4 ms
Lead Channel Setting Sensing Sensitivity: 0.3 mV
Zone Setting Status: 755011

## 2022-10-30 ENCOUNTER — Other Ambulatory Visit (HOSPITAL_COMMUNITY): Payer: Self-pay | Admitting: Internal Medicine

## 2022-11-05 ENCOUNTER — Ambulatory Visit (HOSPITAL_COMMUNITY)
Admission: RE | Admit: 2022-11-05 | Discharge: 2022-11-05 | Disposition: A | Discharge status: Home / Self Care | Payer: Self-pay | Source: Ambulatory Visit | Attending: Internal Medicine | Admitting: Internal Medicine

## 2022-11-05 ENCOUNTER — Encounter (HOSPITAL_COMMUNITY): Payer: Self-pay | Admitting: Internal Medicine

## 2022-11-05 VITALS — BP 130/70 | HR 60 | Wt 123.8 lb

## 2022-11-05 DIAGNOSIS — I110 Hypertensive heart disease with heart failure: Secondary | ICD-10-CM | POA: Insufficient documentation

## 2022-11-05 DIAGNOSIS — L905 Scar conditions and fibrosis of skin: Secondary | ICD-10-CM | POA: Insufficient documentation

## 2022-11-05 DIAGNOSIS — Z79899 Other long term (current) drug therapy: Secondary | ICD-10-CM | POA: Insufficient documentation

## 2022-11-05 DIAGNOSIS — Z5986 Financial insecurity: Secondary | ICD-10-CM | POA: Insufficient documentation

## 2022-11-05 DIAGNOSIS — Z8674 Personal history of sudden cardiac arrest: Secondary | ICD-10-CM | POA: Insufficient documentation

## 2022-11-05 DIAGNOSIS — Z95810 Presence of automatic (implantable) cardiac defibrillator: Secondary | ICD-10-CM | POA: Insufficient documentation

## 2022-11-05 DIAGNOSIS — I472 Ventricular tachycardia, unspecified: Secondary | ICD-10-CM

## 2022-11-05 DIAGNOSIS — D869 Sarcoidosis, unspecified: Secondary | ICD-10-CM | POA: Insufficient documentation

## 2022-11-05 DIAGNOSIS — Z4502 Encounter for adjustment and management of automatic implantable cardiac defibrillator: Secondary | ICD-10-CM | POA: Insufficient documentation

## 2022-11-05 DIAGNOSIS — I5020 Unspecified systolic (congestive) heart failure: Secondary | ICD-10-CM

## 2022-11-05 DIAGNOSIS — I340 Nonrheumatic mitral (valve) insufficiency: Secondary | ICD-10-CM | POA: Insufficient documentation

## 2022-11-05 DIAGNOSIS — R4189 Other symptoms and signs involving cognitive functions and awareness: Secondary | ICD-10-CM | POA: Insufficient documentation

## 2022-11-05 DIAGNOSIS — I5042 Chronic combined systolic (congestive) and diastolic (congestive) heart failure: Secondary | ICD-10-CM | POA: Insufficient documentation

## 2022-11-05 LAB — COMPREHENSIVE METABOLIC PANEL
ALT: 29 U/L (ref 0–44)
AST: 43 U/L — ABNORMAL HIGH (ref 15–41)
Albumin: 3.8 g/dL (ref 3.5–5.0)
Alkaline Phosphatase: 127 U/L — ABNORMAL HIGH (ref 38–126)
Anion gap: 9 (ref 5–15)
BUN: 14 mg/dL (ref 8–23)
CO2: 22 mmol/L (ref 22–32)
Calcium: 9 mg/dL (ref 8.9–10.3)
Chloride: 109 mmol/L (ref 98–111)
Creatinine, Ser: 1.31 mg/dL — ABNORMAL HIGH (ref 0.44–1.00)
GFR, Estimated: 42 mL/min — ABNORMAL LOW (ref >60)
Glucose, Bld: 94 mg/dL (ref 70–99)
Potassium: 3.6 mmol/L (ref 3.5–5.1)
Sodium: 140 mmol/L (ref 135–145)
Total Bilirubin: 0.8 mg/dL (ref 0.3–1.2)
Total Protein: 6.4 g/dL — ABNORMAL LOW (ref 6.5–8.1)

## 2022-11-05 LAB — TSH: TSH: 0.3 u[IU]/mL — ABNORMAL LOW (ref 0.350–4.500)

## 2022-11-05 LAB — T4, FREE: Free T4: 1.37 ng/dL — ABNORMAL HIGH (ref 0.61–1.12)

## 2022-11-05 LAB — BRAIN NATRIURETIC PEPTIDE: B Natriuretic Peptide: 148.6 pg/mL — ABNORMAL HIGH (ref 0.0–100.0)

## 2022-11-05 NOTE — Progress Notes (Signed)
Advanced Heart Failure Clinic Note  PCP: Nicholes Rough, PA-C Primary Cardiologist: Dr. Johnsie Cancel  HF Cardiologist: Dr. Haroldine Laws  Reason for visit: Follow-up for chronic systolic heart failure  HPI: Ms Whicker is a 77 y.o. woman with a past medical history of interstitial cystitis, sarcoidosis (eye, lung, and skin), osteopenia, MAT, HTN, MR, pulmonary nodules on CT 11/1738, and systolic heart failure.  In July 2020 she was admitted to [facility] with increased shortness breath. EKG showed A fib/MAT. ECHO  EF 30-35%  and mod-severe MR.GDMT initiated   TEE 8/21 LVEF 40-45% w/ mild global HK with moderate to severe functional central MR.Marland Kitchen Referred to Dr. Burt Knack for consideration of [facility] but did not keep the appointment d/t gap in insurance coverage.  Seen in [facility] 8/22 and was doing well on GDMT with stable NYHA II symptoms. Sarcoid thought to be quiescent. Repeat echo ordered   She presented to [facility] 07/17/21 with cardiac arrest/Vfib (CPR time approx 5 minutes; 1 dose of epi; she was successfully shocked out of it). Post ROSC she had MAT/Afib, no signs of ST elevation, and soft BPs. Intubated, NE gtt started, and Cardiology consulted. Started on amio and lidocaine. Placed empirically on cardiac sarcoid protocol w/ steroids/ MTX. Echo 07/18/21 EF 35-40% Severe MR. She developed cardiogenic shock and was started on DBA and NE. R/LHC with no coronary disease, low filling pressures. High output on DBA + Noepi. CMRI showed LVEF 60% RV 60%. LGE basal septal midwall. Nonspecific scar. Needs cardiac PET.  She had MDT ICD placed. Marland Kitchen Discharged home on cardiac sarcoid treatment protocol w/ prednisone and MTX taper. Bactrim for PJP prophylaxis. Weight 136 lbs.  Midodrine stopped and arranged for cardiac PET at [facility] at post hospital follow up 12/22.  Echo 6/23 showed EF 50%, RV ok, moderate to severe MR with moderate MAC, moderate TR.  Follow up 9/23, not taking medications consistently and volume up. Lasix 20 mg  daily + 20 KCL daily started.   Today she returns for HF follow up. Here with her daughter. Feeling much better but not very active. Denies SOB, orthopnea or PND. No edema. No ICD firing  ICD interrogation: No VT/AF. Fluid ok. Activity level < 1hr/day Personally reviewed   Cardiac studies  - Echo (6/23): EF 50%, RV ok, moderate to severe MR with moderate MAC, moderate TR.  - R/LHC (11/22): On NE 3 and DBA 5   Ao =  118/41 (67) LV =  121/9 RA =  2 RV = 52/4 PA = 56/6 (24) PCW = 7 Fick cardiac output/index = 11.2/6.6 PVR = 1.5 WU SVR = 463 FA sat = 99% PA sat = 82%, 82% SVC sat = 64%   Assessment: 1. Normal coronary arteries 2. EF 45% by v-gram 3. High cardiac output with low SVR on DBA 5 and NE 3. No evidence of intracardiac shunting.   - Echo (11/22): EF 35-40% Severe MR  - CMRI (11/22): LV EF 60% RV 60%. LVEF 60% RV 60% . LGE basal septal midwall. Nonspecific scar. Moderate MR  - Echo (12/20): EF ~45% MV appears thickened and possible rheumatic with 3+ MR. Severe LAE. Mod TR. Personally reviewed  - Echo (7/20):  30-35% , RV normal, MV mod-severe MR.   - Myoview (7/20): no ischemia, moderate LV dysfunction 30%   - TEE (8/21): moderate to severe functional central MR. LVEF 40-45% w/ mild global HK.  - cMRI 04/24/19:  1.  Hilar lymphadenopathy consistent with sarcoidosis diagnosis.   2.  Mildly dilated LV with EF 45%, there was mild diffuse hypokinesis with severe mid inferior hypokinesis.   3.  Normal RV size and systolic function, EF 16%.   4. Mitral regurgitation appeared severe (flow sequences to quantify were not done).   5. Possible subtle mid-wall LGE in the mid inferior wall (not seen on long axis views). This could be consistent with cardiac sarcoidosis. This is not consistent with prior MI.  Review of systems complete and found to be negative unless listed in HPI.  Past Medical History:  Diagnosis Date   Bladder spasms    Cardiomegaly 03/15/2019    CHF (congestive heart failure) (HCC)    Chronic interstitial cystitis    Combined systolic and diastolic heart failure (HCC)    Dysuria    Elevated troponin 03/15/2019   End-stage glaucoma    RIGHT EYE   Frequency of urination    Legally blind in right eye, as defined in [place]    SECONDARY TO GLAUCOMA   Lymphadenopathy, hilar 03/15/2019   Multifocal atrial tachycardia    Nocturia    Pleural effusion 03/15/2019   Prolonged QT interval 03/15/2019   Pulmonary sarcoidosis ([place]) 07/09/2019   Sarcoidosis 03/15/2019   Sarcoidosis of skin    Sensation of pressure in bladder area    Solitary pulmonary nodule 07/09/2019   Urgency of urination    Current Outpatient Medications  Medication Sig Dispense Refill   amiodarone (PACERONE) 200 MG tablet Take 1 tablet (200 mg total) by mouth daily. 90 tablet 3   atorvastatin (LIPITOR) 20 MG tablet Take 1 tablet (20 mg total) by mouth daily at 6 PM. 30 tablet 11   calcium carbonate (TUMS - DOSED IN MG ELEMENTAL CALCIUM) 500 MG chewable tablet Chew 1 tablet by mouth daily.     donepezil (ARICEPT) 10 MG tablet Take 10 mg by mouth at bedtime.     folic acid (FOLVITE) 1 MG tablet Take 1 tablet by mouth once daily 30 tablet 0   furosemide (LASIX) 20 MG tablet Take 1 tablet by mouth once daily 30 tablet 0   guaiFENesin (MUCINEX) 600 MG 12 hr tablet Take 600 mg by mouth daily.     methotrexate (RHEUMATREX) 2.5 MG tablet TAKE 8 TABLETS BY MOUTH EVERY WEDNESDAY 32 tablet 0   Multiple Vitamins-Minerals (MULTIVITAMIN WITH MINERALS) tablet Take 1 tablet by mouth daily.     potassium chloride (MICRO-K) 10 MEQ CR capsule Take 5 mEq by mouth daily.     spironolactone (ALDACTONE) 25 MG tablet Take 1/2 (one-half) tablet by mouth once daily 45 tablet 0   No current facility-administered medications for this encounter.   Allergies  Allergen Reactions   Cinnamon Other (See Comments)    MOUTH ULCERS   Farxiga [Dapagliflozin] Other (See Comments)    Skin infection   Social  History   Socioeconomic History   Marital status: Married    Spouse name: Not on file   Number of children: Not on file   Years of education: Not on file   Highest education level: Not on file  Occupational History   Not on file  Tobacco Use   Smoking status: Never   Smokeless tobacco: Never  Substance and Sexual Activity   Alcohol use: No   Drug use: No   Sexual activity: Not on file  Other Topics Concern   Not on file  Social History Narrative   Not on file   Social Determinants of Health   Financial Resource Strain: High  Risk (04/23/2021)   Overall Financial Resource Strain (CARDIA)    Difficulty of Paying Living Expenses: Hard  Food Insecurity: No Food Insecurity (04/23/2021)   Hunger Vital Sign    Worried About Running Out of Food in the Last Year: Never true    Ran Out of Food in the Last Year: Never true  Transportation Needs: No Transportation Needs (04/23/2021)   PRAPARE - Hydrologist (Medical): No    Lack of Transportation (Non-Medical): No  Physical Activity: Not on file  Stress: Not on file  Social Connections: Not on file  Intimate Partner Violence: Not on file   Family History  Problem Relation Age of Onset   CAD Father    Heart attack Father    Sarcoidosis Other    Healthy Sister    Hyperlipidemia Sister    Healthy Sister    [place] Readings from Last 3 Encounters:  11/05/22 56.2 kg (123 lb 12.8 oz)  07/03/22 55.8 kg (123 lb)  05/27/22 58 kg (127 lb 12.8 oz)   BP 130/70   Pulse 60   Wt 56.2 kg (123 lb 12.8 oz)   SpO2 100%   BMI 21.25 kg/m   Physical Exam: General:  Well appearing. No resp difficulty HEENT: normal R eye opacified Neck: supple. no JVD. Carotids 2+ bilat;+ bruits. No lymphadenopathy or thryomegaly appreciated. Cor: PMI nondisplaced. Regular rate & rhythm. 2/6 SEM at RUSB Lungs: clear Abdomen: soft, nontender, nondistended. No hepatosplenomegaly. No bruits or masses. Good bowel sounds. Extremities: no  cyanosis, clubbing, rash, edema Neuro: alert & orientedx3, cranial nerves grossly intact. moves all 4 extremities w/o difficulty. Affect pleasant   ECG (personally reviewed): NSR 63 bpm  ASSESSMENT & PLAN: 1. Chronic Systolic Heart Failure with recovered EF - cMRI (8/20): with EF 45%. RV normal. Minimal LGE -> doubt significant cardiac sarcoid - Echo (12/20): EF ~45% MV appears thickened and possible rheumatic with 3+ MR. Severe LAE. Mod TR.  - TEE (8/21): LVEF 40-45% w/ mild global HK moderate to severe functional central MR. - Echo (11/22) EF 35-40% Severe MR - cMRI (11/22):  LV EF 60% RV 60%. LVEF 60% RV 60% . LGE basal septal midwall. Nonspecific scar. Recommending cardiac PET but she is uninsured.   - Echo (6/23): EF 50%, RV ok, moderate to severe MR with moderate MAC, moderate TR. - Doing well from HF standpoint, NYHA II Volume ok  - Continue Lasix 20 mg MWF + 10 KCL on Lasix days - Continue spironolactone 25 mg daily.  - Off Entresto with AKI. BP has been too low to start ARB/ARNI. Better today but with recent AKI and recovered EF will hold for now.  - Unable to tolerate [place]. - Labs today - Due for repeat PET  2. 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 Loanne Drilling in Pulmonary.  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  - cMRI 11/22 EF 60% normal RV. LGE basal septal midwall. Nonspecific scar.   - Now on [facility] Immunosuppressive Therapy for Cardiac Sarcoid  - Completed prednisone. - Continue methotrexate 20 mg every Wednesday. - Continue folic acid 1 mg daily.  - Unable to get Cardiac PET at [facility] w/o insurance (see below). Will arrange for cardiac PET here - ordered    3. 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 cMRI with LGE possible sarcoid.  -  s/p MDT ICD.  - Continue amio 200 mg daily. Amio labs today - Continue  MTX protocol.  - ICD interrogated. No VT   4. MAT - Zio in 9/20 mostly SR, some PACs (4.7%), rare PVCs (<1.0%), 22 runs of SVT.  - In NSR today   5. 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 - Severe MR on echo, but moderate on cMRI (11/22) - She had been referred for possible [facility]. Did not keep appointment due to gap in insurance coverage - Echo (6/23) moderate to severe, mean valve gradient 2.5 mmHg, MV area 2.50 cm.  - I do not hear MR on exam today. Repeat echo   6. HTN - BP elevated today, has not had meds yet. - Consider adding ARB/ARNi next if renal function stable.  7. Cognitive decline - On Aricept, per PCP  8. SDOH - Engage HFSW to help with insurance.  Glori Bickers, MD  3:24 PM

## 2022-11-05 NOTE — Patient Instructions (Addendum)
There has been no changes to your medications.  Labs done today, your results will be available in MyChart, we will contact you for abnormal readings.  Your physician has requested that you have an echocardiogram. Echocardiography is a painless test that uses sound waves to create images of your heart. It provides your doctor with information about the size and shape of your heart and how well your heart's chambers and valves are working. This procedure takes approximately one hour. There are no restrictions for this procedure. Please do NOT wear cologne, perfume, aftershave, or lotions (deodorant is allowed). Please arrive 15 minutes prior to your appointment time.  How to Prepare for Your Cardiac PET/CT Stress Test:  1. Please do not take these medications before your test:   Medications that may interfere with the cardiac pharmacological stress agent (ex. nitrates - including erectile dysfunction medications, isosorbide mononitrate, tamulosin or beta-blockers) the day of the exam. (Erectile dysfunction medication should be held for at least 72 hrs prior to test) Theophylline containing medications for 12 hours. Dipyridamole 48 hours prior to the test. Your remaining medications may be taken with water.  2. Nothing to eat or drink, except water, 3 hours prior to arrival time.   NO caffeine/decaffeinated products, or chocolate 12 hours prior to arrival.  3. NO perfume, cologne or lotion  4. Total time is 1 to 2 hours; you may want to bring reading material for the waiting time.  5. Please report to Radiology at the [facility] Main Entrance 30 minutes early for your test.  [place], [place] [address]  Diabetic Preparation:  Hold oral medications. You may take NPH and Lantus insulin. Do not take Humalog or Humulin R (Regular Insulin) the day of your test. Check blood sugars prior to leaving the house. If able to eat breakfast prior to 3 hour fasting, you may  take all medications, including your insulin, Do not worry if you miss your breakfast dose of insulin - start at your next meal.  IF YOU THINK YOU MAY BE PREGNANT, OR ARE NURSING PLEASE INFORM THE TECHNOLOGIST.  In preparation for your appointment, medication and supplies will be purchased.  Appointment availability is limited, so if you need to cancel or reschedule, please call the Radiology Department at [phone]  24 hours in advance to avoid a cancellation fee of $100.00  What to Expect After you Arrive:  Once you arrive and check in for your appointment, you will be taken to a preparation room within the Radiology Department.  A technologist or Nurse will obtain your medical history, verify that you are correctly prepped for the exam, and explain the procedure.  Afterwards,  an IV will be started in your arm and electrodes will be placed on your skin for EKG monitoring during the stress portion of the exam. Then you will be escorted to the PET/CT scanner.  There, staff will get you positioned on the scanner and obtain a blood pressure and EKG.  During the exam, you will continue to be connected to the EKG and blood pressure machines.  A small, safe amount of a radioactive tracer will be injected in your IV to obtain a series of pictures of your heart along with an injection of a stress agent.    After your Exam:  It is recommended that you eat a meal and drink a caffeinated beverage to counter act any effects of the stress agent.  Drink plenty of fluids for the remainder  of the day and urinate frequently for the first couple of hours after the exam.  Your doctor will inform you of your test results within 7-10 business days.  For questions about your test or how to prepare for your test, please call: Marchia Bond, Cardiac Imaging Nurse Navigator  Gordy Clement, Cardiac Imaging Nurse Navigator Office: [phone]  ONCE APPROVED BY YOUR INSURANCE COMPANY YOU WILL BE CALLED TO HAVE THE  TEST ARRANGED.  Your physician recommends that you schedule a follow-up appointment in: 6 months ( September) ** please call the office in July to arrange your follow up appointment. **  If you have any questions or concerns before your next appointment please send [place] a message through [place] or call our office at [phone].    TO LEAVE A MESSAGE FOR THE NURSE SELECT OPTION 2, PLEASE LEAVE A MESSAGE INCLUDING: YOUR NAME DATE OF BIRTH CALL BACK NUMBER REASON FOR CALL**this is important as we prioritize the call backs  YOU WILL RECEIVE A CALL BACK THE SAME DAY AS LONG AS YOU CALL BEFORE 4:00 PM  At the [facility], you and your health needs are our priority. As part of our continuing mission to provide you with exceptional heart care, we have created designated Provider Care Teams. These Care Teams include your primary Cardiologist (physician) and Advanced Practice Providers (APPs- Physician Assistants and Nurse Practitioners) who all work together to provide you with the care you need, when you need it.   You may see any of the following providers on your designated Care Team at your next follow up: Dr Glori Bickers Dr Loralie Champagne Dr. Roxana Hires, NP Lyda Jester, [place] [facility] [place], [place] Forestine Na, NP Audry Riles, PharmD   Please be sure to bring in all your medications bottles to every appointment.    Thank you for choosing [facility]

## 2022-11-06 LAB — T3, FREE: T3, Free: 2.2 pg/mL (ref 2.0–4.4)

## 2022-11-09 ENCOUNTER — Telehealth: Payer: Self-pay | Admitting: *Deleted

## 2022-11-09 NOTE — Telephone Encounter (Signed)
No pre cert reqd for for cardiac pet scan. Message sent to the [facility] pet scan pool to schedule.

## 2022-11-26 ENCOUNTER — Ambulatory Visit (HOSPITAL_COMMUNITY): Admission: RE | Admit: 2022-11-26 | Payer: Self-pay | Source: Ambulatory Visit

## 2022-11-26 ENCOUNTER — Encounter: Payer: Self-pay | Admitting: Internal Medicine

## 2022-12-02 NOTE — Progress Notes (Signed)
Remote ICD transmission.   

## 2022-12-08 ENCOUNTER — Other Ambulatory Visit (HOSPITAL_COMMUNITY): Payer: Self-pay

## 2022-12-08 ENCOUNTER — Other Ambulatory Visit (HOSPITAL_COMMUNITY): Payer: Self-pay | Admitting: Internal Medicine

## 2022-12-08 MED ORDER — POTASSIUM CHLORIDE ER 10 MEQ PO CPCR: 10.0000 meq | ORAL_CAPSULE | Freq: Every day | ORAL | 1 refills | Status: DC

## 2022-12-08 MED ORDER — FUROSEMIDE 20 MG PO TABS: 20.0000 mg | ORAL_TABLET | Freq: Every day | ORAL | 0 refills | Status: DC

## 2022-12-08 MED ORDER — AMIODARONE HCL 200 MG PO TABS: 200.0000 mg | ORAL_TABLET | Freq: Every day | ORAL | 3 refills | Status: DC

## 2022-12-08 MED ORDER — SPIRONOLACTONE 25 MG PO TABS: ORAL_TABLET | ORAL | 0 refills | Status: DC

## 2022-12-17 ENCOUNTER — Other Ambulatory Visit (HOSPITAL_COMMUNITY): Payer: Self-pay | Admitting: Internal Medicine

## 2022-12-18 ENCOUNTER — Ambulatory Visit (HOSPITAL_COMMUNITY): Payer: Self-pay

## 2023-01-07 ENCOUNTER — Ambulatory Visit (HOSPITAL_COMMUNITY)
Admission: RE | Admit: 2023-01-07 | Discharge: 2023-01-07 | Disposition: A | Discharge status: Home / Self Care | Payer: Self-pay | Source: Ambulatory Visit | Attending: Internal Medicine | Admitting: Internal Medicine

## 2023-01-07 DIAGNOSIS — I081 Rheumatic disorders of both mitral and tricuspid valves: Secondary | ICD-10-CM | POA: Insufficient documentation

## 2023-01-07 DIAGNOSIS — I5020 Unspecified systolic (congestive) heart failure: Secondary | ICD-10-CM | POA: Insufficient documentation

## 2023-01-07 DIAGNOSIS — I3139 Other pericardial effusion (noninflammatory): Secondary | ICD-10-CM | POA: Insufficient documentation

## 2023-01-07 DIAGNOSIS — I110 Hypertensive heart disease with heart failure: Secondary | ICD-10-CM | POA: Insufficient documentation

## 2023-01-07 LAB — ECHOCARDIOGRAM COMPLETE
Area-P 1/2: 3.27 cm2
Calc EF: 58.8 %
MV M vel: 5.47 m/s
MV Peak grad: 119.7 mmHg
Radius: 0.3 cm
S' Lateral: 2.4 cm
Single Plane A2C EF: 57.2 %
Single Plane A4C EF: 59.4 %

## 2023-01-07 NOTE — Progress Notes (Signed)
Echocardiogram 2D Echocardiogram has been performed.  Toni Amend 01/07/2023, 3:19 PM

## 2023-01-26 ENCOUNTER — Ambulatory Visit (INDEPENDENT_AMBULATORY_CARE_PROVIDER_SITE_OTHER): Payer: Self-pay

## 2023-01-26 DIAGNOSIS — I472 Ventricular tachycardia, unspecified: Secondary | ICD-10-CM

## 2023-01-28 LAB — CUP PACEART REMOTE DEVICE CHECK
Battery Remaining Longevity: 105 mo
Battery Voltage: 3.02 V
Brady Statistic AP VP Percent: 36.62 %
Brady Statistic AP VS Percent: 19.02 %
Brady Statistic AS VP Percent: 0.02 %
Brady Statistic AS VS Percent: 44.34 %
Brady Statistic RA Percent Paced: 55.52 %
Brady Statistic RV Percent Paced: 36.49 %
Date Time Interrogation Session: 20240529223827
HighPow Impedance: 55 Ohm
Implantable Lead Connection Status: 753985
Implantable Lead Connection Status: 753985
Implantable Lead Implant Date: 20221125
Implantable Lead Implant Date: 20221125
Implantable Lead Location: 753859
Implantable Lead Location: 753860
Implantable Lead Model: 5076
Implantable Lead Model: 6935
Implantable Pulse Generator Implant Date: 20221125
Lead Channel Impedance Value: 323 Ohm
Lead Channel Impedance Value: 380 Ohm
Lead Channel Impedance Value: 456 Ohm
Lead Channel Pacing Threshold Amplitude: 0.625 V
Lead Channel Pacing Threshold Amplitude: 0.875 V
Lead Channel Pacing Threshold Pulse Width: 0.4 ms
Lead Channel Pacing Threshold Pulse Width: 0.4 ms
Lead Channel Sensing Intrinsic Amplitude: 2 mV
Lead Channel Sensing Intrinsic Amplitude: 2 mV
Lead Channel Sensing Intrinsic Amplitude: 5.75 mV
Lead Channel Sensing Intrinsic Amplitude: 5.75 mV
Lead Channel Setting Pacing Amplitude: 1.5 V
Lead Channel Setting Pacing Amplitude: 2 V
Lead Channel Setting Pacing Pulse Width: 0.4 ms
Lead Channel Setting Sensing Sensitivity: 0.3 mV
Zone Setting Status: 755011

## 2023-02-18 ENCOUNTER — Other Ambulatory Visit (HOSPITAL_COMMUNITY): Payer: Self-pay | Admitting: Internal Medicine

## 2023-02-19 NOTE — Progress Notes (Signed)
Remote ICD transmission.   

## 2023-02-20 ENCOUNTER — Other Ambulatory Visit (HOSPITAL_COMMUNITY): Payer: Self-pay | Admitting: Internal Medicine

## 2023-03-27 ENCOUNTER — Other Ambulatory Visit (HOSPITAL_COMMUNITY): Payer: Self-pay | Admitting: Internal Medicine

## 2023-04-27 ENCOUNTER — Ambulatory Visit (INDEPENDENT_AMBULATORY_CARE_PROVIDER_SITE_OTHER): Payer: Self-pay

## 2023-04-27 DIAGNOSIS — I472 Ventricular tachycardia, unspecified: Secondary | ICD-10-CM

## 2023-04-27 LAB — CUP PACEART REMOTE DEVICE CHECK
Battery Remaining Longevity: 101 mo
Battery Voltage: 3.02 V
Brady Statistic AP VP Percent: 39.02 %
Brady Statistic AP VS Percent: 15.99 %
Brady Statistic AS VP Percent: 0.02 %
Brady Statistic AS VS Percent: 44.97 %
Brady Statistic RA Percent Paced: 54.92 %
Brady Statistic RV Percent Paced: 38.92 %
Date Time Interrogation Session: 20240827043724
HighPow Impedance: 50 Ohm
Implantable Lead Connection Status: 753985
Implantable Lead Connection Status: 753985
Implantable Lead Implant Date: 20221125
Implantable Lead Implant Date: 20221125
Implantable Lead Location: 753859
Implantable Lead Location: 753860
Implantable Lead Model: 5076
Implantable Lead Model: 6935
Implantable Pulse Generator Implant Date: 20221125
Lead Channel Impedance Value: 342 Ohm
Lead Channel Impedance Value: 380 Ohm
Lead Channel Impedance Value: 456 Ohm
Lead Channel Pacing Threshold Amplitude: 0.625 V
Lead Channel Pacing Threshold Amplitude: 1 V
Lead Channel Pacing Threshold Pulse Width: 0.4 ms
Lead Channel Pacing Threshold Pulse Width: 0.4 ms
Lead Channel Sensing Intrinsic Amplitude: 2.25 mV
Lead Channel Sensing Intrinsic Amplitude: 2.25 mV
Lead Channel Sensing Intrinsic Amplitude: 7 mV
Lead Channel Sensing Intrinsic Amplitude: 7 mV
Lead Channel Setting Pacing Amplitude: 1.5 V
Lead Channel Setting Pacing Amplitude: 2 V
Lead Channel Setting Pacing Pulse Width: 0.4 ms
Lead Channel Setting Sensing Sensitivity: 0.3 mV
Zone Setting Status: 755011

## 2023-05-03 ENCOUNTER — Other Ambulatory Visit (HOSPITAL_COMMUNITY): Payer: Self-pay | Admitting: Internal Medicine

## 2023-05-10 NOTE — Progress Notes (Signed)
Remote ICD transmission.   

## 2023-05-16 ENCOUNTER — Other Ambulatory Visit (HOSPITAL_COMMUNITY): Payer: Self-pay | Admitting: Internal Medicine

## 2023-05-21 ENCOUNTER — Other Ambulatory Visit (HOSPITAL_COMMUNITY): Payer: Self-pay

## 2023-05-21 MED ORDER — ATORVASTATIN CALCIUM 20 MG PO TABS: 20.0000 mg | ORAL_TABLET | Freq: Every day | ORAL | 0 refills | Status: DC

## 2023-06-07 ENCOUNTER — Other Ambulatory Visit (HOSPITAL_COMMUNITY): Payer: Self-pay | Admitting: Internal Medicine

## 2023-07-03 ENCOUNTER — Other Ambulatory Visit (HOSPITAL_COMMUNITY): Payer: Self-pay | Admitting: Internal Medicine

## 2023-07-08 ENCOUNTER — Other Ambulatory Visit (HOSPITAL_COMMUNITY): Payer: Self-pay | Admitting: Family Medicine

## 2023-07-11 ENCOUNTER — Other Ambulatory Visit (HOSPITAL_COMMUNITY): Payer: Self-pay | Admitting: Internal Medicine

## 2023-07-13 ENCOUNTER — Other Ambulatory Visit (HOSPITAL_COMMUNITY): Payer: Self-pay | Admitting: Internal Medicine

## 2023-07-27 ENCOUNTER — Ambulatory Visit (INDEPENDENT_AMBULATORY_CARE_PROVIDER_SITE_OTHER): Payer: Self-pay

## 2023-07-27 DIAGNOSIS — I5020 Unspecified systolic (congestive) heart failure: Secondary | ICD-10-CM

## 2023-07-27 DIAGNOSIS — I472 Ventricular tachycardia, unspecified: Secondary | ICD-10-CM

## 2023-07-27 LAB — CUP PACEART REMOTE DEVICE CHECK
Battery Remaining Longevity: 98 mo
Battery Voltage: 3.02 V
Brady Statistic AP VP Percent: 51.32 %
Brady Statistic AP VS Percent: 11.98 %
Brady Statistic AS VP Percent: 0.01 %
Brady Statistic AS VS Percent: 36.69 %
Brady Statistic RA Percent Paced: 63.18 %
Brady Statistic RV Percent Paced: 51.16 %
Date Time Interrogation Session: 20241126012203
HighPow Impedance: 59 Ohm
Implantable Lead Connection Status: 753985
Implantable Lead Connection Status: 753985
Implantable Lead Implant Date: 20221125
Implantable Lead Implant Date: 20221125
Implantable Lead Location: 753859
Implantable Lead Location: 753860
Implantable Lead Model: 5076
Implantable Lead Model: 6935
Implantable Pulse Generator Implant Date: 20221125
Lead Channel Impedance Value: 380 Ohm
Lead Channel Impedance Value: 399 Ohm
Lead Channel Impedance Value: 513 Ohm
Lead Channel Pacing Threshold Amplitude: 0.625 V
Lead Channel Pacing Threshold Amplitude: 0.75 V
Lead Channel Pacing Threshold Pulse Width: 0.4 ms
Lead Channel Pacing Threshold Pulse Width: 0.4 ms
Lead Channel Sensing Intrinsic Amplitude: 2 mV
Lead Channel Sensing Intrinsic Amplitude: 2 mV
Lead Channel Sensing Intrinsic Amplitude: 6.375 mV
Lead Channel Sensing Intrinsic Amplitude: 6.375 mV
Lead Channel Setting Pacing Amplitude: 1.5 V
Lead Channel Setting Pacing Amplitude: 2 V
Lead Channel Setting Pacing Pulse Width: 0.4 ms
Lead Channel Setting Sensing Sensitivity: 0.3 mV
Zone Setting Status: 755011

## 2023-08-18 ENCOUNTER — Other Ambulatory Visit (HOSPITAL_COMMUNITY): Payer: Self-pay | Admitting: Internal Medicine

## 2023-08-27 NOTE — Progress Notes (Signed)
Remote ICD transmission.   

## 2023-09-06 ENCOUNTER — Inpatient Hospital Stay (HOSPITAL_COMMUNITY): Admission: RE | Admit: 2023-09-06 | Payer: Self-pay | Source: Ambulatory Visit | Admitting: Internal Medicine

## 2023-09-14 ENCOUNTER — Other Ambulatory Visit (HOSPITAL_COMMUNITY): Payer: Self-pay | Admitting: Internal Medicine

## 2023-09-22 ENCOUNTER — Other Ambulatory Visit (HOSPITAL_COMMUNITY): Payer: Self-pay | Admitting: Internal Medicine

## 2023-09-27 ENCOUNTER — Other Ambulatory Visit (HOSPITAL_BASED_OUTPATIENT_CLINIC_OR_DEPARTMENT_OTHER): Payer: Self-pay

## 2023-10-07 ENCOUNTER — Telehealth (HOSPITAL_COMMUNITY): Payer: Self-pay | Admitting: Internal Medicine

## 2023-10-08 ENCOUNTER — Ambulatory Visit (HOSPITAL_COMMUNITY)
Admission: RE | Admit: 2023-10-08 | Discharge: 2023-10-08 | Disposition: A | Discharge status: Home / Self Care | Payer: Self-pay | Source: Ambulatory Visit | Attending: Internal Medicine | Admitting: Internal Medicine

## 2023-10-08 ENCOUNTER — Encounter (HOSPITAL_COMMUNITY): Payer: Self-pay | Admitting: Internal Medicine

## 2023-10-08 VITALS — BP 118/70 | HR 59 | Wt 127.8 lb

## 2023-10-08 DIAGNOSIS — I472 Ventricular tachycardia, unspecified: Secondary | ICD-10-CM

## 2023-10-08 DIAGNOSIS — I4901 Ventricular fibrillation: Secondary | ICD-10-CM | POA: Insufficient documentation

## 2023-10-08 DIAGNOSIS — I5022 Chronic systolic (congestive) heart failure: Secondary | ICD-10-CM | POA: Insufficient documentation

## 2023-10-08 DIAGNOSIS — I493 Ventricular premature depolarization: Secondary | ICD-10-CM | POA: Insufficient documentation

## 2023-10-08 DIAGNOSIS — I471 Supraventricular tachycardia, unspecified: Secondary | ICD-10-CM | POA: Insufficient documentation

## 2023-10-08 DIAGNOSIS — Z79899 Other long term (current) drug therapy: Secondary | ICD-10-CM | POA: Insufficient documentation

## 2023-10-08 DIAGNOSIS — I110 Hypertensive heart disease with heart failure: Secondary | ICD-10-CM | POA: Insufficient documentation

## 2023-10-08 DIAGNOSIS — I10 Essential (primary) hypertension: Secondary | ICD-10-CM

## 2023-10-08 DIAGNOSIS — I5042 Chronic combined systolic (congestive) and diastolic (congestive) heart failure: Secondary | ICD-10-CM

## 2023-10-08 DIAGNOSIS — R4189 Other symptoms and signs involving cognitive functions and awareness: Secondary | ICD-10-CM | POA: Insufficient documentation

## 2023-10-08 DIAGNOSIS — I480 Paroxysmal atrial fibrillation: Secondary | ICD-10-CM

## 2023-10-08 DIAGNOSIS — Z95810 Presence of automatic (implantable) cardiac defibrillator: Secondary | ICD-10-CM | POA: Insufficient documentation

## 2023-10-08 DIAGNOSIS — I340 Nonrheumatic mitral (valve) insufficiency: Secondary | ICD-10-CM | POA: Insufficient documentation

## 2023-10-08 DIAGNOSIS — Z8674 Personal history of sudden cardiac arrest: Secondary | ICD-10-CM | POA: Insufficient documentation

## 2023-10-08 DIAGNOSIS — D869 Sarcoidosis, unspecified: Secondary | ICD-10-CM | POA: Insufficient documentation

## 2023-10-08 LAB — COMPREHENSIVE METABOLIC PANEL
ALT: 23 U/L (ref 0–44)
AST: 41 U/L (ref 15–41)
Albumin: 3.7 g/dL (ref 3.5–5.0)
Alkaline Phosphatase: 149 U/L — ABNORMAL HIGH (ref 38–126)
Anion gap: 10 (ref 5–15)
BUN: 15 mg/dL (ref 8–23)
CO2: 25 mmol/L (ref 22–32)
Calcium: 9.1 mg/dL (ref 8.9–10.3)
Chloride: 106 mmol/L (ref 98–111)
Creatinine, Ser: 1.27 mg/dL — ABNORMAL HIGH (ref 0.44–1.00)
GFR, Estimated: 43 mL/min — ABNORMAL LOW (ref >60)
Glucose, Bld: 91 mg/dL (ref 70–99)
Potassium: 4.1 mmol/L (ref 3.5–5.1)
Sodium: 141 mmol/L (ref 135–145)
Total Bilirubin: 0.8 mg/dL (ref 0.0–1.2)
Total Protein: 6.6 g/dL (ref 6.5–8.1)

## 2023-10-08 LAB — TSH: TSH: 0.378 u[IU]/mL (ref 0.350–4.500)

## 2023-10-08 LAB — T4, FREE: Free T4: 1.22 ng/dL — ABNORMAL HIGH (ref 0.61–1.12)

## 2023-10-08 LAB — BRAIN NATRIURETIC PEPTIDE: B Natriuretic Peptide: 120.3 pg/mL — ABNORMAL HIGH (ref 0.0–100.0)

## 2023-10-08 MED ORDER — AMIODARONE HCL 200 MG PO TABS: 100.0000 mg | ORAL_TABLET | Freq: Every day | ORAL | 3 refills | Status: DC

## 2023-10-08 MED ORDER — AMIODARONE HCL 100 MG PO TABS: 100.0000 mg | ORAL_TABLET | Freq: Every day | ORAL | 3 refills | Status: AC

## 2023-10-08 NOTE — Progress Notes (Signed)
 Advanced Heart Failure Clinic Note  PCP: Samie Frederick, PA-C Primary Cardiologist: Dr. Delford  HF Cardiologist: Dr. Cherrie  Reason for visit: Follow-up for chronic systolic heart failure  HPI: Ms Pinette is a 78 y.o. woman with a past medical history of interstitial cystitis, sarcoidosis (eye, lung, and skin), osteopenia, MAT, HTN, MR, pulmonary nodules on CT 03/2019, and systolic heart failure.  In July 2020 she was admitted to [facility] with increased shortness breath. EKG showed A fib/MAT. ECHO  EF 30-35%  and mod-severe MR.GDMT initiated   TEE 8/21 LVEF 40-45% w/ mild global HK with moderate to severe functional central MR.SABRA Referred to Dr. Wonda for consideration of [facility] but did not keep the appointment d/t gap in insurance coverage.  Seen in [facility] 8/22 and was doing well on GDMT with stable NYHA II symptoms. Sarcoid thought to be quiescent. Repeat echo ordered   She presented to [facility] 07/17/21 with cardiac arrest/Vfib (CPR time approx 5 minutes; 1 dose of epi; she was successfully shocked out of it). Post ROSC she had MAT/Afib, no signs of ST elevation, and soft BPs. Intubated, NE gtt started, and Cardiology consulted. Started on amio and lidocaine . Placed empirically on cardiac sarcoid protocol w/ steroids/ MTX. Echo 07/18/21 EF 35-40% Severe MR. She developed cardiogenic shock and was started on DBA and NE. R/LHC with no coronary disease, low filling pressures. High output on DBA + Noepi. CMRI showed LVEF 60% RV 60%. LGE basal septal midwall. Nonspecific scar. Needs cardiac PET.  She had MDT ICD placed. SABRA Discharged home on cardiac sarcoid treatment protocol w/ prednisone  and MTX taper. Bactrim  for PJP prophylaxis. Weight 136 lbs.  Midodrine  stopped and arranged for cardiac PET at [facility] at post hospital follow up 12/22.  Echo 6/23 showed EF 50%, RV ok, moderate to severe MR with moderate MAC, moderate TR.  Echo 5/24: EF 55-60% RV ok Mod MR   Returns for HF f/u with her daughter. Says  she feels great. Taking methotrexate  20mg  once a week. Denies CP or SOB. No edema.  Not very active but does ADL without problem. Recently completed abx for UTI.   ICD interrogation: No VT/AF. Fluip was up but now it is back down. Activity level ~1hr.day Personally reviewed   Cardiac studies  - Echo (6/23): EF 50%, RV ok, moderate to severe MR with moderate MAC, moderate TR.  - R/LHC (11/22): On NE 3 and DBA 5   Ao =  118/41 (67) LV =  121/9 RA =  2 RV = 52/4 PA = 56/6 (24) PCW = 7 Fick cardiac output/index = 11.2/6.6 PVR = 1.5 WU SVR = 463 FA sat = 99% PA sat = 82%, 82% SVC sat = 64%   Assessment: 1. Normal coronary arteries 2. EF 45% by v-gram 3. High cardiac output with low SVR on DBA 5 and NE 3. No evidence of intracardiac shunting.   - Echo (11/22): EF 35-40% Severe MR  - CMRI (11/22): LV EF 60% RV 60%. LVEF 60% RV 60% . LGE basal septal midwall. Nonspecific scar. Moderate MR  - Echo (12/20): EF ~45% MV appears thickened and possible rheumatic with 3+ MR. Severe LAE. Mod TR. Personally reviewed  - Echo (7/20):  30-35% , RV normal, MV mod-severe MR.   - Myoview  (7/20): no ischemia, moderate LV dysfunction 30%   - TEE (8/21): moderate to severe functional central MR. LVEF 40-45% w/ mild global HK.  - cMRI 04/24/19:  1.  Hilar lymphadenopathy consistent with  sarcoidosis diagnosis.   2. Mildly dilated LV with EF 45%, there was mild diffuse hypokinesis with severe mid inferior hypokinesis.   3.  Normal RV size and systolic function, EF 51%.   4. Mitral regurgitation appeared severe (flow sequences to quantify were not done).   5. Possible subtle mid-wall LGE in the mid inferior wall (not seen on long axis views). This could be consistent with cardiac sarcoidosis. This is not consistent with prior MI.  Review of systems complete and found to be negative unless listed in HPI.  Past Medical History:  Diagnosis Date   Bladder spasms    Cardiomegaly 03/15/2019    CHF (congestive heart failure) (HCC)    Chronic interstitial cystitis    Combined systolic and diastolic heart failure (HCC)    Dysuria    Elevated troponin 03/15/2019   End-stage glaucoma    RIGHT EYE   Frequency of urination    Legally blind in right eye, as defined in [place]     SECONDARY TO GLAUCOMA   Lymphadenopathy, hilar 03/15/2019   Multifocal atrial tachycardia (HCC)    Nocturia    Pleural effusion 03/15/2019   Prolonged QT interval 03/15/2019   Pulmonary sarcoidosis (HCC) 07/09/2019   Sarcoidosis 03/15/2019   Sarcoidosis of skin    Sensation of pressure in bladder area    Solitary pulmonary nodule 07/09/2019   Urgency of urination    Current Outpatient Medications  Medication Sig Dispense Refill   amiodarone  (PACERONE ) 200 MG tablet Take 1 tablet (200 mg total) by mouth daily. 90 tablet 3   atorvastatin  (LIPITOR) 20 MG tablet TAKE 1 TABLET BY MOUTH ONCE DAILY 6 IN THE EVENING 30 tablet 0   calcium  carbonate (TUMS - DOSED IN MG ELEMENTAL CALCIUM ) 500 MG chewable tablet Chew 1 tablet by mouth daily.     donepezil (ARICEPT) 10 MG tablet Take 10 mg by mouth at bedtime.     folic acid  (FOLVITE ) 1 MG tablet Take 1 tablet by mouth once daily 30 tablet 0   furosemide  (LASIX ) 20 MG tablet Take 20 mg by mouth once a week.     guaiFENesin  (MUCINEX ) 600 MG 12 hr tablet Take 600 mg by mouth daily.     methotrexate  (RHEUMATREX) 2.5 MG tablet TAKE 8 TABLETS BY MOUTH EVERY WEDNESDAY 32 tablet 0   Multiple Vitamins-Minerals (MULTIVITAMIN WITH MINERALS) tablet Take 1 tablet by mouth daily.     spironolactone  (ALDACTONE ) 25 MG tablet Take 1/2 (one-half) tablet by mouth once daily 45 tablet 3   potassium chloride  (MICRO-K ) 10 MEQ CR capsule Take 1 capsule (10 mEq total) by mouth daily. (Patient not taking: Reported on 10/08/2023) 30 capsule 1   No current facility-administered medications for this encounter.   Allergies  Allergen Reactions   Cinnamon Other (See Comments)    MOUTH ULCERS    Farxiga  [Dapagliflozin ] Other (See Comments)    Skin infection   Social History   Socioeconomic History   Marital status: Married    Spouse name: Not on file   Number of children: Not on file   Years of education: Not on file   Highest education level: Not on file  Occupational History   Not on file  Tobacco Use   Smoking status: Never   Smokeless tobacco: Never  Substance and Sexual Activity   Alcohol use: No   Drug use: No   Sexual activity: Not on file  Other Topics Concern   Not on file  Social History Narrative  Not on file   Social Drivers of Health   Financial Resource Strain: Low Risk  (08/02/2023)   Received [facility]   Overall Financial Resource Strain (CARDIA)    Difficulty of Paying Living Expenses: Not hard at all  Food Insecurity: No Food Insecurity (08/02/2023)   Received [facility]   Hunger Vital Sign    Worried About Running Out of Food in the Last Year: Never true    Ran Out of Food in the Last Year: Never true  Transportation Needs: No Transportation Needs (08/02/2023)   Received from [facility] - Transportation    Lack of Transportation (Medical): No    Lack of Transportation (Non-Medical): No  Physical Activity: Not on file  Stress: Not on file  Social Connections: Unknown (01/09/2022)   Received from [facility]   Social Network    Social Network: Not on file  Intimate Partner Violence: Unknown (12/05/2021)   Received from [facility]   HITS    Physically Hurt: Not on file    Insult or Talk Down To: Not on file    Threaten Physical Harm: Not on file    Scream or Curse: Not on file   Family History  Problem Relation Age of Onset   CAD Father    Heart attack Father    Sarcoidosis Other    Healthy Sister    Hyperlipidemia Sister    Healthy Sister    [place] Readings from Last 3 Encounters:  10/08/23 58 kg (127 lb 12.8 oz)  11/05/22 56.2 kg (123 lb 12.8 oz)  07/03/22 55.8 kg (123  lb)   BP 118/70   Pulse (!) 59   Wt 58 kg (127 lb 12.8 oz)   SpO2 100%   BMI 21.94 kg/m   Physical Exam: General:  Elderly Frail No resp difficulty HEENT: normal Neck: supple. no JVD. Carotids 2+ bilat; no bruits. No lymphadenopathy or thryomegaly appreciated. Cor: PMI nondisplaced. Regular rate & rhythm. No rubs, gallops or murmurs. Lungs: clear Abdomen: soft, nontender, nondistended. No hepatosplenomegaly. No bruits or masses. Good bowel sounds. Extremities: no cyanosis, clubbing, rash, edema Neuro: alert & orientedx3, cranial nerves grossly intact. moves all 4 extremities w/o difficulty. Affect pleasant   ECG (personally reviewed): NSR 64 No ST-T wave abnormalities. Personally reviewed   ASSESSMENT & PLAN:  1. Chronic Systolic Heart Failure with recovered EF - cMRI (8/20): with EF 45%. RV normal. Minimal LGE -> doubt significant cardiac sarcoid - Echo (12/20): EF ~45% MV appears thickened and possible rheumatic with 3+ MR. Severe LAE. Mod TR.  - TEE (8/21): LVEF 40-45% w/ mild global HK moderate to severe functional central MR. - Echo (11/22) EF 35-40% Severe MR - cMRI (11/22):  LV EF 60% RV 60%. LVEF 60% RV 60% . LGE basal septal midwall. Nonspecific scar. Recommending cardiac PET but she is uninsured.   - Echo (6/23): EF 50%, RV ok, moderate to severe MR with moderate MAC, moderate TR. - Echo 5/24: EF 55-60% Mod MR  - Overall NYHA II-III but not very active - Volume status looks good - Continue Lasix  20 mg F + 10 KCL on Lasix  day  extra as needed - Continue spironolactone  25 mg daily.  - Off Entresto  with AKI. BP has been too low to start ARB/ARNI. Better today but with recent AKI and recovered EF will hold for now.  - Unable to tolerate Farxiga  - Due for repeat PET to make sure  sarcoid s quiescent off steroids  2. 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 Kassie in Pulmonary.  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  - cMRI 11/22 EF 60%  normal RV. LGE basal septal midwall. Nonspecific scar.   - Treated with [facility] Immunosuppressive Therapy for Cardiac Sarcoid  - Completed prednisone . - Continue methotrexate  20 mg every Wednesday. - Continue folic acid  1 mg daily.  - No VT on ICD (as above)   3. 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 cMRI with LGE possible sarcoid.  - s/p MDT ICD.  - VT has been quiescent. Cut amio to 100 daily - Check amioo labs   4. MAT - Zio in 9/20 mostly SR, some PACs (4.7%), rare PVCs (<1.0%), 22 runs of SVT.  - In NSR today         d   5. 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 - Severe MR on echo, but moderate on cMRI (11/22) - She had been referred for possible [facility]. Did not keep appointment due to gap in insurance coverage - Echo (6/23) moderate to severe, mean valve gradient 2.5 mmHg, MV area 2.50 cm.  -  Echo 5/24: EF 55-60% Mod MR  - Stable. Continue f/u   6. HTN -Blood pressure well controlled. Continue current regimen.  7. Cognitive decline - On Aricept, per PCP - stable   Toribio Fuel, MD  3:08 PM

## 2023-10-08 NOTE — Patient Instructions (Signed)
 Decrease amiodarone  to 100 mg daily. Updated Rx sent to your pharmacy. Labs today - will call you if abnormal. We have ordered a cardiac pet scan to evaluate sarcoid. We will obtain prior authorization and call you to schedule. Please see instructions below. Return to see Dr. Cherrie in 6 months with an echo. Please call [phone] in July to schedule this appointment. Please call us  at [phone] if any questions or concerns prior to your next appointment.      Cardiac Sarcoidosis/Inflammation PET Scan Patient Instructions  Please report to Radiology at the [facility] Main Entrance 15 minutes early for your test.  [address] [place], [place] [address] BRING FOOD DIARY WITH YOU TO THIS APPOINTMENT For 24 hours before the test: Do not exercise! Do not eat after 5 pm the day before your test! To make sure that your scanning results are accurate, you MUST follow the sarcoid prep meal diet starting the day before your PET scan. This diet involves eating no carbohydrates 24 hours before the test.  You will keep a log of all that you eat the day before your test. If you have questions or do not understand this diet, please call [phone] for more information. If you are unable to follow this diet, please discuss an alternative strategy with the coordinator.  If you are diabetic, continue your diabetes medications as usual on the day before until you begin to fast. NO DIABETES MEDICATIONS ONCE YOU BEGIN TO FAST. What foods can I eat the day before my test?  Drink only water or black coffee (WITHOUT sugar, artificial sweetener, cream, or milk). Eggs (prepared without milk or cheese)  Meat that is either broiled or pan fried in butter WITHOUT breading (chicken, turkey, bacon, meat-only sausage, hamburger, steak, fish) Butter, salt & pepper What foods must I AVOID the day before my test?  Do not consume alcoholic beverages, sodas, fruit juice, coffee creamer, or sports  drinks  Do not eat vegetables, beans, nuts, fruits, juices, bread, grains, rice, pasta, potatoes, or any baked goods Do not eat dairy products (milk, cheese, etc.)  Do not eat mayonnaise, ketchup, tartar sauce, mustard, or other condiments Do not add sugar, artificial sweeteners, or Splenda (sucralose) to foods or drinks  Do not eat breaded foods (like fried chicken)  Do not eat sweets, candy, gum, sweetened cough drops, lozenges, or sugar  Do not eat sweetened, grilled, or cured meats or meat with carbohydrate-containing additives (some sausages, ham, sweetened bacon) Suggested items for breakfast, lunch, or dinner:  Breakfast  3 to 5 fatty sausage links fried in butter. 3 to 5 bacon strips.  3 eggs pan fried in butter (no milk or cheese).  Lunch/Dinner  2 hamburger patties fried in butter. Chicken or fatty fish pan fried in butter. No breading. 8 oz. fatty steak pan fried in butter.  Beverages  Drink only water or black coffee. DO NOT ADD SUGAR, ARTIFICIAL SWEETENER, CREAM, OR MILK  Please call [facility] to cancel or reschedule your appointment at [phone] For more information and frequently asked questions, please visit our website : [email or web address]     Cardiac Sarcoidosis/Inflammation PET Scan  Food Diary Name: _____________________________ Please fill in EXACTLY what you have eaten and when for 24 hours PRIOR to your test date.  Time Food/Drink Comments  Breakfast                Lunch  Dinner                Snacks                 DO NOT EXERCISE THE DAY BEFORE YOUR TEST DO NOT EAT AFTER 5 PM THE DAY BEFORE YOUR TEST.  ON THE DAY OF YOUR TEST, DO NOT EAT ANY FOOD AND ONLY DRINK CLEAR WATER! PLEASE BRING THIS FOOD DIARY WITH YOU TO YOUR APPOINTMENT

## 2023-10-10 LAB — T3, FREE: T3, Free: 2.6 pg/mL (ref 2.0–4.4)

## 2023-10-11 ENCOUNTER — Telehealth (HOSPITAL_COMMUNITY): Payer: Self-pay | Admitting: Licensed Clinical Social Worker

## 2023-10-11 NOTE — Telephone Encounter (Signed)
 CSW consulted to speak with pt dtr regarding getting pt additional insurance coverage- only has Medicare part A.  CSW team has spoken with pt/pt dtr on two other occasions about this since 2022.  Attempted to call dtr to discuss- unable to reach or leave VM- will continue to attempt  Denton Flakes, LCSW Clinical Social Worker Advanced [facility] Clinic Desk#: [phone] Cell#: [phone]

## 2023-10-22 ENCOUNTER — Telehealth (HOSPITAL_COMMUNITY): Payer: Self-pay | Admitting: Licensed Clinical Social Worker

## 2023-10-22 NOTE — Telephone Encounter (Signed)
 CSW attempted to call pt dtr to discuss questions regarding insurance for pt.  Phone answered by someone other than daughter- left message for her to return call when available.  Burna Sis, LCSW Clinical Social Worker [facility] Desk#: [phone] Cell#: [phone]

## 2023-10-26 ENCOUNTER — Ambulatory Visit: Payer: Self-pay

## 2023-10-26 DIAGNOSIS — I472 Ventricular tachycardia, unspecified: Secondary | ICD-10-CM

## 2023-10-27 LAB — CUP PACEART REMOTE DEVICE CHECK
Battery Remaining Longevity: 93 mo
Battery Voltage: 3.02 V
Brady Statistic AP VP Percent: 38.4 %
Brady Statistic AP VS Percent: 15.48 %
Brady Statistic AS VP Percent: 0.02 %
Brady Statistic AS VS Percent: 46.1 %
Brady Statistic RA Percent Paced: 53.86 %
Brady Statistic RV Percent Paced: 38.36 %
Date Time Interrogation Session: 20250225043622
HighPow Impedance: 56 Ohm
Implantable Lead Connection Status: 753985
Implantable Lead Connection Status: 753985
Implantable Lead Implant Date: 20221125
Implantable Lead Implant Date: 20221125
Implantable Lead Location: 753859
Implantable Lead Location: 753860
Implantable Lead Model: 5076
Implantable Lead Model: 6935
Implantable Pulse Generator Implant Date: 20221125
Lead Channel Impedance Value: 342 Ohm
Lead Channel Impedance Value: 342 Ohm
Lead Channel Impedance Value: 456 Ohm
Lead Channel Pacing Threshold Amplitude: 0.625 V
Lead Channel Pacing Threshold Amplitude: 0.75 V
Lead Channel Pacing Threshold Pulse Width: 0.4 ms
Lead Channel Pacing Threshold Pulse Width: 0.4 ms
Lead Channel Sensing Intrinsic Amplitude: 2.625 mV
Lead Channel Sensing Intrinsic Amplitude: 2.625 mV
Lead Channel Sensing Intrinsic Amplitude: 5.75 mV
Lead Channel Sensing Intrinsic Amplitude: 5.75 mV
Lead Channel Setting Pacing Amplitude: 1.5 V
Lead Channel Setting Pacing Amplitude: 2 V
Lead Channel Setting Pacing Pulse Width: 0.4 ms
Lead Channel Setting Sensing Sensitivity: 0.3 mV
Zone Setting Status: 755011

## 2023-11-21 ENCOUNTER — Other Ambulatory Visit (HOSPITAL_COMMUNITY): Payer: Self-pay | Admitting: Internal Medicine

## 2023-11-28 ENCOUNTER — Other Ambulatory Visit (HOSPITAL_COMMUNITY): Payer: Self-pay | Admitting: Internal Medicine

## 2023-12-03 NOTE — Progress Notes (Signed)
 Remote ICD transmission.

## 2023-12-03 NOTE — Addendum Note (Signed)
 Addended by: Elease Etienne A on: 12/03/2023 09:03 AM   Modules accepted: Orders

## 2023-12-13 ENCOUNTER — Telehealth (HOSPITAL_COMMUNITY): Payer: Self-pay | Admitting: *Deleted

## 2023-12-13 NOTE — Telephone Encounter (Signed)
 Attempted to call patient regarding upcoming cardiac PET appointment. Left message on voicemail with name and callback number  Larey Brick RN Navigator Cardiac Imaging [facility] Heart and Vascular Services [phone] Office [phone] Cell

## 2023-12-15 ENCOUNTER — Encounter (HOSPITAL_COMMUNITY): Admission: RE | Admit: 2023-12-15 | Payer: Self-pay | Source: Ambulatory Visit

## 2024-01-16 ENCOUNTER — Other Ambulatory Visit (HOSPITAL_COMMUNITY): Payer: Self-pay | Admitting: Internal Medicine

## 2024-01-19 ENCOUNTER — Encounter (HOSPITAL_COMMUNITY): Payer: Self-pay | Admitting: Emergency Medicine

## 2024-01-25 ENCOUNTER — Ambulatory Visit (INDEPENDENT_AMBULATORY_CARE_PROVIDER_SITE_OTHER): Payer: Self-pay

## 2024-01-25 DIAGNOSIS — I5042 Chronic combined systolic (congestive) and diastolic (congestive) heart failure: Secondary | ICD-10-CM

## 2024-01-25 DIAGNOSIS — I472 Ventricular tachycardia, unspecified: Secondary | ICD-10-CM

## 2024-01-26 LAB — CUP PACEART REMOTE DEVICE CHECK
Battery Remaining Longevity: 90 mo
Battery Voltage: 3.01 V
Brady Statistic AP VP Percent: 26.09 %
Brady Statistic AP VS Percent: 24.07 %
Brady Statistic AS VP Percent: 0.01 %
Brady Statistic AS VS Percent: 49.83 %
Brady Statistic RA Percent Paced: 50.14 %
Brady Statistic RV Percent Paced: 26.06 %
Date Time Interrogation Session: 20250527211704
HighPow Impedance: 48 Ohm
Implantable Lead Connection Status: 753985
Implantable Lead Connection Status: 753985
Implantable Lead Implant Date: 20221125
Implantable Lead Implant Date: 20221125
Implantable Lead Location: 753859
Implantable Lead Location: 753860
Implantable Lead Model: 5076
Implantable Lead Model: 6935
Implantable Pulse Generator Implant Date: 20221125
Lead Channel Impedance Value: 323 Ohm
Lead Channel Impedance Value: 380 Ohm
Lead Channel Impedance Value: 456 Ohm
Lead Channel Pacing Threshold Amplitude: 0.625 V
Lead Channel Pacing Threshold Amplitude: 0.625 V
Lead Channel Pacing Threshold Pulse Width: 0.4 ms
Lead Channel Pacing Threshold Pulse Width: 0.4 ms
Lead Channel Sensing Intrinsic Amplitude: 2.125 mV
Lead Channel Sensing Intrinsic Amplitude: 2.125 mV
Lead Channel Sensing Intrinsic Amplitude: 4.625 mV
Lead Channel Sensing Intrinsic Amplitude: 4.625 mV
Lead Channel Setting Pacing Amplitude: 1.5 V
Lead Channel Setting Pacing Amplitude: 2 V
Lead Channel Setting Pacing Pulse Width: 0.4 ms
Lead Channel Setting Sensing Sensitivity: 0.3 mV
Zone Setting Status: 755011

## 2024-01-28 ENCOUNTER — Ambulatory Visit: Payer: Self-pay | Admitting: Internal Medicine

## 2024-02-08 ENCOUNTER — Telehealth (HOSPITAL_COMMUNITY): Payer: Self-pay | Admitting: *Deleted

## 2024-02-08 NOTE — Telephone Encounter (Signed)
 Attempted to call patient regarding upcoming cardiac PET appointment. Left message on voicemail with name and callback number Johney Frame RN Navigator Cardiac Imaging [facility] Heart and Vascular Services [phone] Office

## 2024-02-10 ENCOUNTER — Ambulatory Visit (HOSPITAL_COMMUNITY): Admission: RE | Admit: 2024-02-10 | Payer: Self-pay | Source: Ambulatory Visit

## 2024-03-05 ENCOUNTER — Other Ambulatory Visit (HOSPITAL_COMMUNITY): Payer: Self-pay | Admitting: Internal Medicine

## 2024-03-09 ENCOUNTER — Encounter (HOSPITAL_COMMUNITY): Admission: RE | Admit: 2024-03-09 | Payer: Self-pay | Source: Ambulatory Visit

## 2024-03-14 ENCOUNTER — Telehealth (HOSPITAL_COMMUNITY): Payer: Self-pay | Admitting: Emergency Medicine

## 2024-03-14 NOTE — Telephone Encounter (Signed)
 Pts daughter opted to cancel appt Camie Shutter RN Navigator Cardiac Imaging [facility] Heart and Vascular Services [phone] Office  [phone] Cell

## 2024-03-15 NOTE — Addendum Note (Signed)
 Addended by: TAWNI DRILLING D on: 03/15/2024 05:14 PM   Modules accepted: Orders

## 2024-03-15 NOTE — Progress Notes (Signed)
 Remote ICD transmission.

## 2024-03-16 ENCOUNTER — Encounter (HOSPITAL_COMMUNITY): Admission: RE | Admit: 2024-03-16 | Payer: Self-pay | Source: Ambulatory Visit

## 2024-03-30 ENCOUNTER — Other Ambulatory Visit (HOSPITAL_COMMUNITY): Payer: Self-pay | Admitting: Internal Medicine

## 2024-04-25 ENCOUNTER — Ambulatory Visit (INDEPENDENT_AMBULATORY_CARE_PROVIDER_SITE_OTHER): Payer: Self-pay

## 2024-04-25 DIAGNOSIS — I472 Ventricular tachycardia, unspecified: Secondary | ICD-10-CM

## 2024-04-26 ENCOUNTER — Ambulatory Visit: Payer: Self-pay | Admitting: Internal Medicine

## 2024-04-26 LAB — CUP PACEART REMOTE DEVICE CHECK
Battery Remaining Longevity: 88 mo
Battery Voltage: 3.01 V
Brady Statistic AP VP Percent: 19.37 %
Brady Statistic AP VS Percent: 21.66 %
Brady Statistic AS VP Percent: 0.01 %
Brady Statistic AS VS Percent: 58.96 %
Brady Statistic RA Percent Paced: 40.9 %
Brady Statistic RV Percent Paced: 19.23 %
Date Time Interrogation Session: 20250826001703
HighPow Impedance: 61 Ohm
Implantable Lead Connection Status: 753985
Implantable Lead Connection Status: 753985
Implantable Lead Implant Date: 20221125
Implantable Lead Implant Date: 20221125
Implantable Lead Location: 753859
Implantable Lead Location: 753860
Implantable Lead Model: 5076
Implantable Lead Model: 6935
Implantable Pulse Generator Implant Date: 20221125
Lead Channel Impedance Value: 380 Ohm
Lead Channel Impedance Value: 399 Ohm
Lead Channel Impedance Value: 532 Ohm
Lead Channel Pacing Threshold Amplitude: 0.5 V
Lead Channel Pacing Threshold Amplitude: 0.625 V
Lead Channel Pacing Threshold Pulse Width: 0.4 ms
Lead Channel Pacing Threshold Pulse Width: 0.4 ms
Lead Channel Sensing Intrinsic Amplitude: 2.375 mV
Lead Channel Sensing Intrinsic Amplitude: 2.375 mV
Lead Channel Sensing Intrinsic Amplitude: 6 mV
Lead Channel Sensing Intrinsic Amplitude: 6 mV
Lead Channel Setting Pacing Amplitude: 1.5 V
Lead Channel Setting Pacing Amplitude: 2 V
Lead Channel Setting Pacing Pulse Width: 0.4 ms
Lead Channel Setting Sensing Sensitivity: 0.3 mV
Zone Setting Status: 755011

## 2024-05-16 NOTE — Progress Notes (Signed)
Remote ICD Transmission.

## 2024-05-28 ENCOUNTER — Other Ambulatory Visit (HOSPITAL_COMMUNITY): Payer: Self-pay | Admitting: Internal Medicine

## 2024-07-16 ENCOUNTER — Other Ambulatory Visit (HOSPITAL_COMMUNITY): Payer: Self-pay | Admitting: Internal Medicine

## 2024-07-25 ENCOUNTER — Ambulatory Visit: Payer: Self-pay

## 2024-07-25 DIAGNOSIS — I472 Ventricular tachycardia, unspecified: Secondary | ICD-10-CM

## 2024-07-26 LAB — CUP PACEART REMOTE DEVICE CHECK
Battery Remaining Longevity: 86 mo
Battery Voltage: 3.01 V
Brady Statistic AP VP Percent: 8.13 %
Brady Statistic AP VS Percent: 27.66 %
Brady Statistic AS VP Percent: 0.02 %
Brady Statistic AS VS Percent: 64.2 %
Brady Statistic RA Percent Paced: 35.77 %
Brady Statistic RV Percent Paced: 8.13 %
Date Time Interrogation Session: 20251125012304
HighPow Impedance: 62 Ohm
Implantable Lead Connection Status: 753985
Implantable Lead Connection Status: 753985
Implantable Lead Implant Date: 20221125
Implantable Lead Implant Date: 20221125
Implantable Lead Location: 753859
Implantable Lead Location: 753860
Implantable Lead Model: 5076
Implantable Lead Model: 6935
Implantable Pulse Generator Implant Date: 20221125
Lead Channel Impedance Value: 380 Ohm
Lead Channel Impedance Value: 399 Ohm
Lead Channel Impedance Value: 494 Ohm
Lead Channel Pacing Threshold Amplitude: 0.5 V
Lead Channel Pacing Threshold Amplitude: 0.5 V
Lead Channel Pacing Threshold Pulse Width: 0.4 ms
Lead Channel Pacing Threshold Pulse Width: 0.4 ms
Lead Channel Sensing Intrinsic Amplitude: 2.375 mV
Lead Channel Sensing Intrinsic Amplitude: 2.375 mV
Lead Channel Sensing Intrinsic Amplitude: 5.375 mV
Lead Channel Sensing Intrinsic Amplitude: 5.375 mV
Lead Channel Setting Pacing Amplitude: 1.5 V
Lead Channel Setting Pacing Amplitude: 2 V
Lead Channel Setting Pacing Pulse Width: 0.4 ms
Lead Channel Setting Sensing Sensitivity: 0.3 mV
Zone Setting Status: 755011

## 2024-07-31 NOTE — Progress Notes (Signed)
 Remote ICD Transmission

## 2024-08-03 ENCOUNTER — Ambulatory Visit: Payer: Self-pay | Admitting: Internal Medicine

## 2024-09-09 ENCOUNTER — Other Ambulatory Visit (HOSPITAL_COMMUNITY): Payer: Self-pay | Admitting: Internal Medicine
